# Patient Record
Sex: Male | Born: 1965 | Race: White | Hispanic: No | Marital: Single | Smoking: Current every day smoker
Health system: Southern US, Community
[De-identification: ages and names within clinical notes are randomized; demographics above are authoritative.]

## PROBLEM LIST (undated history)

## (undated) DIAGNOSIS — F419 Anxiety disorder, unspecified: Secondary | ICD-10-CM

## (undated) DIAGNOSIS — I10 Essential (primary) hypertension: Secondary | ICD-10-CM

## (undated) DIAGNOSIS — E785 Hyperlipidemia, unspecified: Secondary | ICD-10-CM

## (undated) DIAGNOSIS — F319 Bipolar disorder, unspecified: Secondary | ICD-10-CM

## (undated) DIAGNOSIS — K219 Gastro-esophageal reflux disease without esophagitis: Secondary | ICD-10-CM

## (undated) DIAGNOSIS — N200 Calculus of kidney: Secondary | ICD-10-CM

## (undated) DIAGNOSIS — I639 Cerebral infarction, unspecified: Secondary | ICD-10-CM

## (undated) HISTORY — DX: Cerebral infarction, unspecified: I63.9

---

## 2016-09-19 ENCOUNTER — Encounter (HOSPITAL_COMMUNITY): Payer: Self-pay | Admitting: Emergency Medicine

## 2016-09-19 ENCOUNTER — Emergency Department (HOSPITAL_COMMUNITY)
Admission: EM | Admit: 2016-09-19 | Discharge: 2016-09-19 | Disposition: A | Discharge status: Home / Self Care | Payer: BLUE CROSS/BLUE SHIELD | Attending: Emergency Medicine | Admitting: Emergency Medicine

## 2016-09-19 DIAGNOSIS — M10031 Idiopathic gout, right wrist: Secondary | ICD-10-CM | POA: Diagnosis not present

## 2016-09-19 DIAGNOSIS — M25531 Pain in right wrist: Secondary | ICD-10-CM | POA: Diagnosis present

## 2016-09-19 DIAGNOSIS — F17210 Nicotine dependence, cigarettes, uncomplicated: Secondary | ICD-10-CM | POA: Insufficient documentation

## 2016-09-19 MED ORDER — PREDNISONE 10 MG PO TABS: ORAL_TABLET | ORAL | 0 refills | Status: DC

## 2016-09-19 MED ORDER — DEXAMETHASONE SODIUM PHOSPHATE 10 MG/ML IJ SOLN
10.0000 mg | Freq: Once | INTRAMUSCULAR | Status: AC
  Administered 2016-09-19: 10 mg via INTRAMUSCULAR
  Filled 2016-09-19: qty 1

## 2016-09-19 MED ORDER — HYDROCODONE-ACETAMINOPHEN 5-325 MG PO TABS: 1.0000 | ORAL_TABLET | ORAL | 0 refills | Status: DC | PRN

## 2016-09-19 NOTE — Discharge Instructions (Signed)
Take your next dose of prednisone tomorrow ( you received todays dose of steroids in the shot you received). You may take the hydrocodone prescribed for pain relief.  This will make you drowsy - do not drive within 4 hours of taking this medication.  Follow up with your primary doctor for a recheck if not improving over the next 48 hours or for worsened symptoms.

## 2016-09-19 NOTE — ED Triage Notes (Signed)
Pain to right hand and wrist, rating pain 9/10.  Spasms to right elbow.

## 2016-09-20 NOTE — ED Provider Notes (Signed)
[facility] DEPT Provider Note   CSN: [id number] Arrival date & time: 09/19/16  1150     History   Chief Complaint Chief Complaint  Patient presents with  . Wrist Pain    HPI Cain SieveKenneth Salata is a 51 y.o. male with a history of gout, presenting with a gouty flare in his right wrist which started several days ago.  He reports frequent episodes of gout in feet, ankles and his right wrist and current episode is similar to his typical gouty flares.  He has applied warm compresses and has taken  Ibuprofen without relief of pain.  He is right handed. Reports in the process of moving to [place] , current [place] in [place].    The history is provided by the patient.    History reviewed. No pertinent past medical history.  There are no active problems to display for this patient.   History reviewed. No pertinent surgical history.     Home Medications    Prior to Admission medications   Medication Sig Start Date End Date Taking? Authorizing Provider  fluticasone (FLONASE) 50 MCG/ACT nasal spray USE 2 SPRAYS NASALLY EACH DAY 09/09/16  Yes [provider]  lamoTRIgine (LAMICTAL) 150 MG tablet Take 150 mg by mouth daily. 09/09/16  Yes [provider]  loratadine (CLARITIN) 10 MG tablet Take 10 mg by mouth daily. 09/09/16  Yes [provider]  lovastatin (MEVACOR) 20 MG tablet Take 40 mg by mouth daily. 09/09/16  Yes [provider]  omeprazole (PRILOSEC) 20 MG capsule Take 20 mg by mouth 2 (two) times daily. 09/09/16  Yes [provider]  HYDROcodone-acetaminophen (NORCO/VICODIN) 5-325 MG tablet Take 1 tablet by mouth every 4 (four) hours as needed. 09/19/16   Burgess AmorIdol, Tonnya Garbett, PA-C  predniSONE (DELTASONE) 10 MG tablet Take 6 tablets day one, 5 tablets day two, 4 tablets day three, 3 tablets day four, 2 tablets day five, then 1 tablet day six 09/19/16   Burgess AmorIdol, Shamari Trostel, PA-C    Family History History reviewed. No pertinent family history.  Social History Social  History  Substance Use Topics  . Smoking status: Current Every Day Smoker    Packs/day: 1.00    Types: Cigarettes  . Smokeless tobacco: Never Used  . Alcohol use No     Allergies   Wellbutrin [bupropion]   Review of Systems Review of Systems  Constitutional: Negative for fever.  Musculoskeletal: Positive for arthralgias and joint swelling. Negative for myalgias.  Skin: Positive for color change.  Neurological: Negative for weakness and numbness.     Physical Exam Updated Vital Signs BP (!) 156/89 (BP Location: Left Arm)   Pulse 78   Temp 98.1 F (36.7 C) (Oral)   Resp 16   Ht 5\' 6"  (1.676 m)   Wt 70.3 kg (155 lb)   SpO2 99%   BMI 25.02 kg/m   Physical Exam  Constitutional: He appears well-developed and well-nourished.  HENT:  Head: Atraumatic.  Neck: Normal range of motion.  Cardiovascular:  Pulses:      Radial pulses are 2+ on the right side, and 2+ on the left side.  Pulses equal bilaterally  Musculoskeletal: He exhibits edema and tenderness.  ttp right dorsal and volar wrist.  Mild erythema.  Pain with flex/ext and light touch.    Neurological: He is alert. He has normal strength. He displays normal reflexes. No sensory deficit.  Skin: Skin is warm and dry.  Erythematous at the right wrist without significant increased warmth.  Psychiatric: He has a normal mood and affect.     ED Treatments / Results  Labs (all labs ordered are listed, but only abnormal results are displayed) Labs Reviewed - No data to display  EKG  EKG Interpretation None       Radiology No results found.  Procedures Procedures (including critical care time)  Medications Ordered in ED Medications  dexamethasone (DECADRON) injection 10 mg (10 mg Intramuscular Given 09/19/16 1324)     Initial Impression / Assessment and Plan / ED Course  I have reviewed the triage vital signs and the nursing notes.  Pertinent labs & imaging results that were available during my care  of the patient were reviewed by me and considered in my medical decision making (see chart for details).     Exam and pt h/o c/w gouty flare.  No evidence or risk factors for joint infection. Decadron injection given here. Prednisone taper, hydrocodone, heat, elevation, rest.  F/u with pcp if not improving.   [place] [facility] prior to [facility].   Final Clinical Impressions(s) / ED Diagnoses   Final diagnoses:  Acute idiopathic gout of right wrist    New Prescriptions Discharge Medication List as of 09/19/2016  1:20 PM    START taking these medications   Details  HYDROcodone-acetaminophen (NORCO/VICODIN) 5-325 MG tablet Take 1 tablet by mouth every 4 (four) hours as needed., Starting Thu 09/19/2016, Print    predniSONE (DELTASONE) 10 MG tablet Take 6 tablets day one, 5 tablets day two, 4 tablets day three, 3 tablets day four, 2 tablets day five, then 1 tablet day six, Print         Burgess Amordol, Loran Auguste, PA-C 09/20/16 0836    Samuel JesterMcManus, Kathleen, DO 09/23/16 1404

## 2016-10-24 ENCOUNTER — Emergency Department
Admission: EM | Admit: 2016-10-24 | Discharge: 2016-10-24 | Disposition: A | Discharge status: Home / Self Care | Payer: BLUE CROSS/BLUE SHIELD | Attending: Emergency Medicine | Admitting: Emergency Medicine

## 2016-10-24 ENCOUNTER — Encounter: Payer: Self-pay | Admitting: Emergency Medicine

## 2016-10-24 DIAGNOSIS — Z79899 Other long term (current) drug therapy: Secondary | ICD-10-CM | POA: Insufficient documentation

## 2016-10-24 DIAGNOSIS — J019 Acute sinusitis, unspecified: Secondary | ICD-10-CM | POA: Insufficient documentation

## 2016-10-24 DIAGNOSIS — F17210 Nicotine dependence, cigarettes, uncomplicated: Secondary | ICD-10-CM | POA: Insufficient documentation

## 2016-10-24 DIAGNOSIS — J329 Chronic sinusitis, unspecified: Secondary | ICD-10-CM

## 2016-10-24 MED ORDER — AMOXICILLIN-POT CLAVULANATE 875-125 MG PO TABS: 1.0000 | ORAL_TABLET | Freq: Two times a day (BID) | ORAL | 0 refills | Status: AC

## 2016-10-24 MED ORDER — METHYLPREDNISOLONE SODIUM SUCC 125 MG IJ SOLR
125.0000 mg | Freq: Once | INTRAMUSCULAR | Status: AC
  Administered 2016-10-24: 125 mg via INTRAMUSCULAR
  Filled 2016-10-24: qty 2

## 2016-10-24 NOTE — ED Notes (Signed)
Pt states hx of sinus infections. "fatigue, sweats, um cold sometimes. My ear is stopped up. Headaches, I just have a yucky feeling." states symptoms x 1 week. Alert, oriented, ambulatory. "it's just my sinuses infected, that's all it is."

## 2016-10-24 NOTE — ED Triage Notes (Signed)
Patient presents to ED via POV from home with c/o sinus pressure and head ache. Hx of same.

## 2016-10-24 NOTE — ED Provider Notes (Signed)
[facility] Emergency Department Provider Note  ____________________________________________  Time seen: Approximately 12:33 PM  I have reviewed the triage vital signs and the nursing notes.   HISTORY  Chief Complaint Recurrent Sinusitis    HPI Timothy Pham is a 51 y.o. male that presents to the emergency department with 7 days of nasal congestion and drainage. A couple of days ago he started coughing clear sputum. He has about 2-3 episodes of sinusitis per year. He states that he usually gets a shot and a prescription for amoxicillin, which resolved his symptoms. He smokes a pack of cigarettes per day.He denies fever, chills, shortness breath, chest pain, nausea, vomiting, abdominal pain.   History reviewed. No pertinent past medical history.  There are no active problems to display for this patient.   History reviewed. No pertinent surgical history.  Prior to Admission medications   Medication Sig Start Date End Date Taking? Authorizing Provider  amoxicillin-clavulanate (AUGMENTIN) 875-125 MG tablet Take 1 tablet by mouth 2 (two) times daily. 10/24/16 11/03/16  Enid Derry, PA-C  fluticasone (FLONASE) 50 MCG/ACT nasal spray USE 2 SPRAYS NASALLY EACH DAY 09/09/16   [provider]  HYDROcodone-acetaminophen (NORCO/VICODIN) 5-325 MG tablet Take 1 tablet by mouth every 4 (four) hours as needed. 09/19/16   Burgess Amor, PA-C  lamoTRIgine (LAMICTAL) 150 MG tablet Take 150 mg by mouth daily. 09/09/16   [provider]  loratadine (CLARITIN) 10 MG tablet Take 10 mg by mouth daily. 09/09/16   [provider]  lovastatin (MEVACOR) 20 MG tablet Take 40 mg by mouth daily. 09/09/16   [provider]  omeprazole (PRILOSEC) 20 MG capsule Take 20 mg by mouth 2 (two) times daily. 09/09/16   [provider]  predniSONE (DELTASONE) 10 MG tablet Take 6 tablets day one, 5 tablets day two, 4 tablets day three, 3 tablets day four, 2 tablets  day five, then 1 tablet day six 09/19/16   Burgess Amor, PA-C    Allergies Wellbutrin [bupropion]  No family history on file.  Social History Social History  Substance Use Topics  . Smoking status: Current Every Day Smoker    Packs/day: 1.00    Types: Cigarettes  . Smokeless tobacco: Never Used  . Alcohol use No     Review of Systems  Constitutional: No fever/chills Eyes: No visual changes. No discharge. ENT: Positive for congestion and rhinorrhea. Cardiovascular: No chest pain. Respiratory: Positive for cough. No SOB. Gastrointestinal: No abdominal pain.  No nausea, no vomiting.  No diarrhea.  No constipation. Musculoskeletal: Negative for musculoskeletal pain. Skin: Negative for rash, abrasions, lacerations, ecchymosis. Neurological: Negative for headaches.   ____________________________________________   PHYSICAL EXAM:  VITAL SIGNS: ED Triage Vitals  Enc Vitals Group     BP 10/24/16 1140 (!) 145/74     Pulse Rate 10/24/16 1140 68     Resp 10/24/16 1140 17     Temp 10/24/16 1140 98 F (36.7 C)     Temp src --      SpO2 10/24/16 1140 98 %     Weight 10/24/16 1141 155 lb (70.3 kg)     Height 10/24/16 1141  (1.702 m)     Head Circumference --      Peak Flow --      Pain Score 10/24/16 1140 8     Pain Loc --      Pain Edu? --      Excl. in GC? --      Constitutional: Alert  and oriented. Well appearing and in no acute distress. Eyes: Conjunctivae are normal. PERRL. EOMI. No discharge. Head: Atraumatic. ENT: Frontal and maxillary sinus tenderness.      Ears: Tympanic membranes pearly gray with good landmarks. No discharge.      Nose: Mild congestion/rhinnorhea.      Mouth/Throat: Mucous membranes are moist. Oropharynx non-erythematous. Tonsils not enlarged. No exudates. Uvula midline. Neck: No stridor.   Hematological/Lymphatic/Immunilogical: No cervical lymphadenopathy. Cardiovascular: Normal rate, regular rhythm.  Good peripheral  circulation. Respiratory: Normal respiratory effort without tachypnea or retractions. Lungs CTAB. Good air entry to the bases with no decreased or absent breath sounds. Gastrointestinal: Bowel sounds 4 quadrants. Soft and nontender to palpation. No guarding or rigidity. No palpable masses. No distention. Musculoskeletal: Full range of motion to all extremities. No gross deformities appreciated. Neurologic:  Normal speech and language. No gross focal neurologic deficits are appreciated.  Skin:  Skin is warm, dry and intact. No rash noted.   ____________________________________________   LABS (all labs ordered are listed, but only abnormal results are displayed)  Labs Reviewed - No data to display ____________________________________________  EKG   ____________________________________________  RADIOLOGY   No results found.  ____________________________________________    PROCEDURES  Procedure(s) performed:    Procedures    Medications  methylPREDNISolone sodium succinate (SOLU-MEDROL) 125 mg/2 mL injection 125 mg (125 mg Intramuscular Given 10/24/16 1246)     ____________________________________________   INITIAL IMPRESSION / ASSESSMENT AND PLAN / ED COURSE  Pertinent labs & imaging results that were available during my care of the patient were reviewed by me and considered in my medical decision making (see chart for details).  Review of the [place] CSRS was performed in accordance of the [facility] prior to dispensing any controlled drugs.  Patient's diagnosis is consistent with sinus infection. Vital signs and exam are reassuring. He was given a shot of Solu-Medrol and will be given a prescription for Augmentin. Patient feels comfortable going home. Patient is to follow up with Augmentin as needed or otherwise directed. Patient is given ED precautions to return to the ED for any worsening or new symptoms.     ____________________________________________  FINAL  CLINICAL IMPRESSION(S) / ED DIAGNOSES  Final diagnoses:  Sinusitis, unspecified chronicity, unspecified location      NEW MEDICATIONS STARTED DURING THIS VISIT:  Discharge Medication List as of 10/24/2016 12:45 PM    START taking these medications   Details  amoxicillin-clavulanate (AUGMENTIN) 875-125 MG tablet Take 1 tablet by mouth 2 (two) times daily., Starting Thu 10/24/2016, Until Sun 11/03/2016, Print            This chart was dictated using voice recognition software/Dragon. Despite best efforts to proofread, errors can occur which can change the meaning. Any change was purely unintentional.    Enid DerryWagner, Kadeshia Kasparian, PA-C 10/24/16 1543    Emily FilbertWilliams, Jonathan E, MD 10/25/16 [phone]

## 2017-06-28 ENCOUNTER — Emergency Department
Admission: EM | Admit: 2017-06-28 | Discharge: 2017-06-28 | Disposition: A | Discharge status: Home / Self Care | Payer: BLUE CROSS/BLUE SHIELD | Attending: Emergency Medicine | Admitting: Emergency Medicine

## 2017-06-28 ENCOUNTER — Emergency Department: Payer: BLUE CROSS/BLUE SHIELD

## 2017-06-28 ENCOUNTER — Other Ambulatory Visit: Payer: Self-pay

## 2017-06-28 DIAGNOSIS — S2241XA Multiple fractures of ribs, right side, initial encounter for closed fracture: Secondary | ICD-10-CM | POA: Insufficient documentation

## 2017-06-28 DIAGNOSIS — Y929 Unspecified place or not applicable: Secondary | ICD-10-CM | POA: Insufficient documentation

## 2017-06-28 DIAGNOSIS — Y998 Other external cause status: Secondary | ICD-10-CM | POA: Insufficient documentation

## 2017-06-28 DIAGNOSIS — Z79899 Other long term (current) drug therapy: Secondary | ICD-10-CM | POA: Insufficient documentation

## 2017-06-28 DIAGNOSIS — F17210 Nicotine dependence, cigarettes, uncomplicated: Secondary | ICD-10-CM | POA: Diagnosis not present

## 2017-06-28 DIAGNOSIS — W010XXA Fall on same level from slipping, tripping and stumbling without subsequent striking against object, initial encounter: Secondary | ICD-10-CM | POA: Diagnosis not present

## 2017-06-28 DIAGNOSIS — Y9389 Activity, other specified: Secondary | ICD-10-CM | POA: Insufficient documentation

## 2017-06-28 DIAGNOSIS — S299XXA Unspecified injury of thorax, initial encounter: Secondary | ICD-10-CM | POA: Diagnosis present

## 2017-06-28 MED ORDER — HYDROCODONE-ACETAMINOPHEN 5-325 MG PO TABS: 1.0000 | ORAL_TABLET | Freq: Four times a day (QID) | ORAL | 0 refills | Status: AC | PRN

## 2017-06-28 MED ORDER — MELOXICAM 15 MG PO TABS: 15.0000 mg | ORAL_TABLET | Freq: Every day | ORAL | 1 refills | Status: AC

## 2017-06-28 NOTE — ED Provider Notes (Signed)
[facility] Emergency Department Provider Note  ____________________________________________  Time seen: Approximately 6:17 PM  I have reviewed the triage vital signs and the nursing notes.   HISTORY  Chief Complaint Fall    HPI Timothy Pham is a 52 y.o. male presents to the emergency department after a fall while fishing 2 days ago.  Patient reports that he heard a crack".  Patient reports 10 out of 10 pain to the right anterior ribs.  Patient reports that he is presenting to the emergency department for pain management "just at night".  He denies shortness of breath or chest pain.  Patient did not hit his head or lose consciousness during the fall.   No past medical history on file.  There are no active problems to display for this patient.   No past surgical history on file.  Prior to Admission medications   Medication Sig Start Date End Date Taking? Authorizing Provider  fluticasone (FLONASE) 50 MCG/ACT nasal spray USE 2 SPRAYS NASALLY EACH DAY 09/09/16   [provider]  HYDROcodone-acetaminophen (NORCO) 5-325 MG tablet Take 1 tablet by mouth every 6 (six) hours as needed for up to 3 days for moderate pain. 06/28/17 07/01/17  Orvil Feil, PA-C  lamoTRIgine (LAMICTAL) 150 MG tablet Take 150 mg by mouth daily. 09/09/16   [provider]  loratadine (CLARITIN) 10 MG tablet Take 10 mg by mouth daily. 09/09/16   [provider]  lovastatin (MEVACOR) 20 MG tablet Take 40 mg by mouth daily. 09/09/16   [provider]  meloxicam (MOBIC) 15 MG tablet Take 1 tablet (15 mg total) by mouth daily for 7 days. 06/28/17 07/05/17  Orvil Feil, PA-C  omeprazole (PRILOSEC) 20 MG capsule Take 20 mg by mouth 2 (two) times daily. 09/09/16   [provider]  predniSONE (DELTASONE) 10 MG tablet Take 6 tablets day one, 5 tablets day two, 4 tablets day three, 3 tablets day four, 2 tablets day five, then 1 tablet day six 09/19/16   Burgess Amor, PA-C    Allergies Wellbutrin [bupropion]  No family history on file.  Social History Social History   Tobacco Use  . Smoking status: Current Every Day Smoker    Packs/day: 1.00    Types: Cigarettes  . Smokeless tobacco: Never Used  Substance Use Topics  . Alcohol use: No  . Drug use: No     Review of Systems  Constitutional: No fever/chills Eyes: No visual changes. No discharge ENT: No upper respiratory complaints. Cardiovascular: no chest pain. Respiratory: no cough. No SOB. Gastrointestinal: No abdominal pain.  No nausea, no vomiting.  No diarrhea.  No constipation. Musculoskeletal: Patient has right anterior rib pain.  Skin: Negative for rash, abrasions, lacerations, ecchymosis. Neurological: Negative for headaches, focal weakness or numbness.   ____________________________________________   PHYSICAL EXAM:  VITAL SIGNS: ED Triage Vitals  Enc Vitals Group     BP 06/28/17 1640 (!) 149/70     Pulse Rate 06/28/17 1640 67     Resp 06/28/17 1640 16     Temp 06/28/17 1640 97.6 F (36.4 C)     Temp Source 06/28/17 1640 Oral     SpO2 06/28/17 1640 97 %     Weight 06/28/17 1639 165 lb (74.8 kg)     Height 06/28/17 1639  (1.651 m)     Head Circumference --      Peak Flow --      Pain Score 06/28/17 1639 5  Pain Loc --      Pain Edu? --      Excl. in GC? --      Constitutional: Alert and oriented. Well appearing and in no acute distress. Eyes: Conjunctivae are normal. PERRL. EOMI. Head: Atraumatic. ENT:      Ears: TMs are pearly.      Nose: No congestion/rhinnorhea.      Mouth/Throat: Mucous membranes are moist.  Neck: No stridor.  No cervical spine tenderness to palpation. Cardiovascular: Normal rate, regular rhythm. Normal S1 and S2.  Good peripheral circulation. Respiratory: Normal respiratory effort without tachypnea or retractions. Lungs CTAB. Good air entry to the bases with no decreased or absent breath sounds. Musculoskeletal: Full  range of motion to all extremities. No gross deformities appreciated.  Patient has tenderness to palpation over right anterior ribs. Neurologic:  Normal speech and language. No gross focal neurologic deficits are appreciated.  Skin:  Skin is warm, dry and intact. No rash noted. ____________________________________________   LABS (all labs ordered are listed, but only abnormal results are displayed)  Labs Reviewed - No data to display ____________________________________________  EKG   ____________________________________________  RADIOLOGY Geraldo Pitter, personally viewed and evaluated these images (plain radiographs) as part of my medical decision making, as well as reviewing the written report by the radiologist.  Dg Ribs Unilateral W/chest Right  Result Date: 06/28/2017 CLINICAL DATA:  Recent fall 2 days ago with right-sided rib pain, initial encounter EXAM: RIGHT RIBS AND CHEST - 3+ VIEW COMPARISON:  None. FINDINGS: Cardiac shadow is within normal limits. The lungs are well aerated bilaterally. There are mildly displaced fractures of the right seventh and eighth ribs anteriorly. Some old rib fractures on the right are noted as well. No pneumothorax is seen. Chronic appearing T12 compression deformity is noted. IMPRESSION: Acute rib fractures on the right of the seventh and eighth ribs without complicating factors. Chronic appearing T12 compression deformity. Electronically Signed   By: Alcide Clever M.D.   On: 06/28/2017 17:35    ____________________________________________    PROCEDURES  Procedure(s) performed:    Procedures    Medications - No data to display   ____________________________________________   INITIAL IMPRESSION / ASSESSMENT AND PLAN / ED COURSE  Pertinent labs & imaging results that were available during my care of the patient were reviewed by me and considered in my medical decision making (see chart for details).  Review of the [place] CSRS was  performed in accordance of the [facility] prior to dispensing any controlled drugs.    Assessment and plan Rib fractures Patient presents to the emergency department with right anterior rib pain after a fall.  X-ray examination of the right ribs reveal nondisplaced seventh and eighth rib fractures.  Patient was given a short course of Norco and meloxicam.  He was advised to follow-up with primary care as needed.  All patient questions were answered.   ____________________________________________  FINAL CLINICAL IMPRESSION(S) / ED DIAGNOSES  Final diagnoses:  Closed fracture of multiple ribs of right side, initial encounter      NEW MEDICATIONS STARTED DURING THIS VISIT:  ED Discharge Orders        Ordered    HYDROcodone-acetaminophen (NORCO) 5-325 MG tablet  Every 6 hours PRN     06/28/17 1756    meloxicam (MOBIC) 15 MG tablet  Daily     06/28/17 1756          This chart was dictated using voice recognition software/Dragon. Despite best efforts to  proofread, errors can occur which can change the meaning. Any change was purely unintentional.    Orvil Feil, PA-C 06/28/17 1821    Phineas Semen, MD 06/28/17 2009

## 2017-06-28 NOTE — ED Triage Notes (Signed)
C/O right rib injury. States slipped while fishing a few days ago and landed on tackle box.  STates he heard ribs crack.  AAOx3.  Skin warm and dry.  Respirations regular and non labored.  No SOB/ DOE.  Gait steady. Posture upright and relaxed.  NAD

## 2018-01-19 ENCOUNTER — Other Ambulatory Visit: Payer: Self-pay

## 2018-01-19 ENCOUNTER — Encounter: Payer: Self-pay | Admitting: Emergency Medicine

## 2018-01-19 ENCOUNTER — Inpatient Hospital Stay
Admission: EM | Admit: 2018-01-19 | Discharge: 2018-01-22 | DRG: 024 | Disposition: A | Discharge status: Home / Self Care | Payer: BLUE CROSS/BLUE SHIELD | Attending: Internal Medicine | Admitting: Internal Medicine

## 2018-01-19 ENCOUNTER — Emergency Department: Payer: BLUE CROSS/BLUE SHIELD

## 2018-01-19 DIAGNOSIS — E785 Hyperlipidemia, unspecified: Secondary | ICD-10-CM | POA: Diagnosis present

## 2018-01-19 DIAGNOSIS — Z888 Allergy status to other drugs, medicaments and biological substances status: Secondary | ICD-10-CM

## 2018-01-19 DIAGNOSIS — I63231 Cerebral infarction due to unspecified occlusion or stenosis of right carotid arteries: Principal | ICD-10-CM | POA: Diagnosis present

## 2018-01-19 DIAGNOSIS — F17210 Nicotine dependence, cigarettes, uncomplicated: Secondary | ICD-10-CM | POA: Diagnosis present

## 2018-01-19 DIAGNOSIS — G459 Transient cerebral ischemic attack, unspecified: Secondary | ICD-10-CM | POA: Diagnosis not present

## 2018-01-19 DIAGNOSIS — Z818 Family history of other mental and behavioral disorders: Secondary | ICD-10-CM

## 2018-01-19 DIAGNOSIS — K219 Gastro-esophageal reflux disease without esophagitis: Secondary | ICD-10-CM | POA: Diagnosis present

## 2018-01-19 DIAGNOSIS — Z79899 Other long term (current) drug therapy: Secondary | ICD-10-CM

## 2018-01-19 DIAGNOSIS — Z9114 Patient's other noncompliance with medication regimen: Secondary | ICD-10-CM

## 2018-01-19 DIAGNOSIS — I10 Essential (primary) hypertension: Secondary | ICD-10-CM | POA: Diagnosis present

## 2018-01-19 DIAGNOSIS — F419 Anxiety disorder, unspecified: Secondary | ICD-10-CM | POA: Diagnosis present

## 2018-01-19 DIAGNOSIS — F329 Major depressive disorder, single episode, unspecified: Secondary | ICD-10-CM | POA: Diagnosis present

## 2018-01-19 DIAGNOSIS — R51 Headache: Secondary | ICD-10-CM

## 2018-01-19 DIAGNOSIS — Z66 Do not resuscitate: Secondary | ICD-10-CM | POA: Diagnosis present

## 2018-01-19 DIAGNOSIS — G40909 Epilepsy, unspecified, not intractable, without status epilepticus: Secondary | ICD-10-CM | POA: Diagnosis present

## 2018-01-19 DIAGNOSIS — R519 Headache, unspecified: Secondary | ICD-10-CM | POA: Diagnosis present

## 2018-01-19 DIAGNOSIS — Z87442 Personal history of urinary calculi: Secondary | ICD-10-CM

## 2018-01-19 DIAGNOSIS — R29700 NIHSS score 0: Secondary | ICD-10-CM | POA: Diagnosis present

## 2018-01-19 DIAGNOSIS — T465X6A Underdosing of other antihypertensive drugs, initial encounter: Secondary | ICD-10-CM | POA: Diagnosis present

## 2018-01-19 DIAGNOSIS — Z8249 Family history of ischemic heart disease and other diseases of the circulatory system: Secondary | ICD-10-CM

## 2018-01-19 HISTORY — DX: Gastro-esophageal reflux disease without esophagitis: K21.9

## 2018-01-19 HISTORY — DX: Bipolar disorder, unspecified: F31.9

## 2018-01-19 HISTORY — DX: Anxiety disorder, unspecified: F41.9

## 2018-01-19 HISTORY — DX: Calculus of kidney: N20.0

## 2018-01-19 HISTORY — DX: Essential (primary) hypertension: I10

## 2018-01-19 HISTORY — DX: Hyperlipidemia, unspecified: E78.5

## 2018-01-19 LAB — CBC
HEMATOCRIT: 45.2 % (ref 39.0–52.0)
Hemoglobin: 15.3 g/dL (ref 13.0–17.0)
MCH: 31.7 pg (ref 26.0–34.0)
MCHC: 33.8 g/dL (ref 30.0–36.0)
MCV: 93.6 fL (ref 80.0–100.0)
PLATELETS: 299 10*3/uL (ref 150–400)
RBC: 4.83 MIL/uL (ref 4.22–5.81)
RDW: 13.3 % (ref 11.5–15.5)
WBC: 12.8 10*3/uL — ABNORMAL HIGH (ref 4.0–10.5)
nRBC: 0 % (ref 0.0–0.2)

## 2018-01-19 MED ORDER — NITROGLYCERIN 2 % TD OINT
TOPICAL_OINTMENT | TRANSDERMAL | Status: AC
  Administered 2018-01-19: 1 [in_us]
  Filled 2018-01-19: qty 1

## 2018-01-19 NOTE — ED Triage Notes (Signed)
Pt arrived to the ED via EMS from work for complaints of high blood pressure, headache and trouble concentrating. Pt reports that he began to experience the symptoms 2 days ago. Pt states that he has been diagnosed with hypertension but never took the medication. Pt is AOx4 in no apparent distress.

## 2018-01-20 ENCOUNTER — Encounter: Payer: Self-pay | Admitting: Radiology

## 2018-01-20 ENCOUNTER — Observation Stay: Payer: BLUE CROSS/BLUE SHIELD

## 2018-01-20 ENCOUNTER — Other Ambulatory Visit
Admission: RE | Admit: 2018-01-20 | Discharge: 2018-01-20 | Disposition: A | Discharge status: Home / Self Care | Payer: BLUE CROSS/BLUE SHIELD | Source: Home / Self Care | Attending: Family Medicine | Admitting: Family Medicine

## 2018-01-20 ENCOUNTER — Other Ambulatory Visit: Payer: Self-pay

## 2018-01-20 ENCOUNTER — Emergency Department: Payer: BLUE CROSS/BLUE SHIELD

## 2018-01-20 DIAGNOSIS — F17210 Nicotine dependence, cigarettes, uncomplicated: Secondary | ICD-10-CM

## 2018-01-20 DIAGNOSIS — R519 Headache, unspecified: Secondary | ICD-10-CM | POA: Diagnosis present

## 2018-01-20 DIAGNOSIS — E785 Hyperlipidemia, unspecified: Secondary | ICD-10-CM | POA: Diagnosis not present

## 2018-01-20 DIAGNOSIS — R51 Headache: Secondary | ICD-10-CM

## 2018-01-20 DIAGNOSIS — I63231 Cerebral infarction due to unspecified occlusion or stenosis of right carotid arteries: Secondary | ICD-10-CM | POA: Diagnosis not present

## 2018-01-20 DIAGNOSIS — G459 Transient cerebral ischemic attack, unspecified: Secondary | ICD-10-CM

## 2018-01-20 LAB — LIPID PANEL
Cholesterol: 266 mg/dL — ABNORMAL HIGH (ref 0–200)
HDL: 53 mg/dL (ref >40)
LDL Cholesterol: 169 mg/dL — ABNORMAL HIGH (ref 0–99)
Total CHOL/HDL Ratio: 5 RATIO
Triglycerides: 222 mg/dL — ABNORMAL HIGH (ref <150)
VLDL: 44 mg/dL — ABNORMAL HIGH (ref 0–40)

## 2018-01-20 LAB — URINE DRUG SCREEN, QUALITATIVE (ARMC ONLY)
AMPHETAMINES, UR SCREEN: NOT DETECTED
Barbiturates, Ur Screen: NOT DETECTED
Benzodiazepine, Ur Scrn: NOT DETECTED
COCAINE METABOLITE, UR ~~LOC~~: NOT DETECTED
Cannabinoid 50 Ng, Ur ~~LOC~~: POSITIVE — AB
MDMA (ECSTASY) UR SCREEN: NOT DETECTED
METHADONE SCREEN, URINE: NOT DETECTED
Opiate, Ur Screen: NOT DETECTED
Phencyclidine (PCP) Ur S: NOT DETECTED
TRICYCLIC, UR SCREEN: NOT DETECTED

## 2018-01-20 LAB — BASIC METABOLIC PANEL
ANION GAP: 13 (ref 5–15)
BUN: 15 mg/dL (ref 6–20)
CHLORIDE: 106 mmol/L (ref 98–111)
CO2: 24 mmol/L (ref 22–32)
Calcium: 9.7 mg/dL (ref 8.9–10.3)
Creatinine, Ser: 0.88 mg/dL (ref 0.61–1.24)
GFR calc Af Amer: >60 mL/min (ref >60)
GFR calc non Af Amer: >60 mL/min (ref >60)
GLUCOSE: 107 mg/dL — AB (ref 70–99)
POTASSIUM: 3.9 mmol/L (ref 3.5–5.1)
Sodium: 143 mmol/L (ref 135–145)

## 2018-01-20 LAB — TROPONIN I: Troponin I: <0.03 ng/mL (ref <0.03)

## 2018-01-20 LAB — HEMOGLOBIN A1C
Hgb A1c MFr Bld: 5.9 % — ABNORMAL HIGH (ref 4.8–5.6)
Mean Plasma Glucose: 122.63 mg/dL

## 2018-01-20 LAB — ETHANOL: Alcohol, Ethyl (B): <10 mg/dL (ref <10)

## 2018-01-20 LAB — TSH: TSH: 2.816 u[IU]/mL (ref 0.350–4.500)

## 2018-01-20 MED ORDER — LISINOPRIL 5 MG PO TABS: 10.0000 mg | ORAL_TABLET | Freq: Every day | ORAL | Status: DC

## 2018-01-20 MED ORDER — ACETAMINOPHEN 325 MG PO TABS: 650.0000 mg | ORAL_TABLET | Freq: Four times a day (QID) | ORAL | Status: DC | PRN

## 2018-01-20 MED ORDER — LISINOPRIL 5 MG PO TABS: 5.0000 mg | ORAL_TABLET | Freq: Every day | ORAL | Status: DC

## 2018-01-20 MED ORDER — GADOBUTROL 1 MMOL/ML IV SOLN
7.5000 mL | Freq: Once | INTRAVENOUS | Status: AC | PRN
  Administered 2018-01-20: 7.5 mL via INTRAVENOUS

## 2018-01-20 MED ORDER — IOHEXOL 350 MG/ML SOLN
75.0000 mL | Freq: Once | INTRAVENOUS | Status: AC | PRN
  Administered 2018-01-20: 75 mL via INTRAVENOUS

## 2018-01-20 MED ORDER — ONDANSETRON HCL 4 MG/2ML IJ SOLN: 4.0000 mg | Freq: Four times a day (QID) | INTRAMUSCULAR | Status: DC | PRN

## 2018-01-20 MED ORDER — ASPIRIN 300 MG RE SUPP: 300.0000 mg | Freq: Every day | RECTAL | Status: DC

## 2018-01-20 MED ORDER — NICOTINE 21 MG/24HR TD PT24
21.0000 mg | MEDICATED_PATCH | Freq: Once | TRANSDERMAL | Status: AC
  Administered 2018-01-20: 21 mg via TRANSDERMAL
  Filled 2018-01-20: qty 1

## 2018-01-20 MED ORDER — LAMOTRIGINE 25 MG PO TABS: 150.0000 mg | ORAL_TABLET | Freq: Every day | ORAL | Status: DC

## 2018-01-20 MED ORDER — FLUTICASONE PROPIONATE 50 MCG/ACT NA SUSP: 2.0000 | Freq: Every day | NASAL | Status: DC

## 2018-01-20 MED ORDER — PRAVASTATIN SODIUM 20 MG PO TABS: 20.0000 mg | ORAL_TABLET | Freq: Every day | ORAL | Status: DC

## 2018-01-20 MED ORDER — METOPROLOL TARTRATE 25 MG PO TABS: 25.0000 mg | ORAL_TABLET | Freq: Two times a day (BID) | ORAL | Status: DC

## 2018-01-20 MED ORDER — HYDRALAZINE HCL 20 MG/ML IJ SOLN: 10.0000 mg | Freq: Four times a day (QID) | INTRAMUSCULAR | Status: DC | PRN

## 2018-01-20 MED ORDER — ENOXAPARIN SODIUM 40 MG/0.4ML ~~LOC~~ SOLN
40.0000 mg | SUBCUTANEOUS | Status: DC
  Administered 2018-01-20 – 2018-01-22 (×3): 40 mg via SUBCUTANEOUS
  Filled 2018-01-20 (×3): qty 0.4

## 2018-01-20 MED ORDER — ARIPIPRAZOLE 10 MG PO TABS
10.0000 mg | ORAL_TABLET | Freq: Every day | ORAL | Status: DC
  Filled 2018-01-20 (×2): qty 1

## 2018-01-20 MED ORDER — PANTOPRAZOLE SODIUM 40 MG PO TBEC
40.0000 mg | DELAYED_RELEASE_TABLET | Freq: Every day | ORAL | Status: DC
  Administered 2018-01-20 – 2018-01-22 (×3): 40 mg via ORAL
  Filled 2018-01-20 (×3): qty 1

## 2018-01-20 MED ORDER — AMLODIPINE BESYLATE 5 MG PO TABS
5.0000 mg | ORAL_TABLET | Freq: Every day | ORAL | Status: DC
  Administered 2018-01-20: 5 mg via ORAL
  Filled 2018-01-20: qty 1

## 2018-01-20 MED ORDER — ATORVASTATIN CALCIUM 20 MG PO TABS
40.0000 mg | ORAL_TABLET | Freq: Every day | ORAL | Status: DC
  Administered 2018-01-20 – 2018-01-21 (×2): 40 mg via ORAL
  Filled 2018-01-20 (×2): qty 2

## 2018-01-20 MED ORDER — ACETAMINOPHEN 650 MG RE SUPP: 650.0000 mg | Freq: Four times a day (QID) | RECTAL | Status: DC | PRN

## 2018-01-20 MED ORDER — ASPIRIN 325 MG PO TABS
325.0000 mg | ORAL_TABLET | Freq: Every day | ORAL | Status: DC
  Administered 2018-01-20 – 2018-01-22 (×3): 325 mg via ORAL
  Filled 2018-01-20 (×6): qty 1

## 2018-01-20 MED ORDER — STROKE: EARLY STAGES OF RECOVERY BOOK: Freq: Once | Status: DC

## 2018-01-20 MED ORDER — LORATADINE 10 MG PO TABS
10.0000 mg | ORAL_TABLET | Freq: Every day | ORAL | Status: DC
  Administered 2018-01-20 – 2018-01-22 (×3): 10 mg via ORAL
  Filled 2018-01-20 (×3): qty 1

## 2018-01-20 MED ORDER — CEFAZOLIN SODIUM-DEXTROSE 2-4 GM/100ML-% IV SOLN
2.0000 g | Freq: Once | INTRAVENOUS | Status: AC
  Administered 2018-01-21 (×2): 2 g via INTRAVENOUS
  Filled 2018-01-20: qty 100

## 2018-01-20 MED ORDER — LAMOTRIGINE 25 MG PO TABS
150.0000 mg | ORAL_TABLET | Freq: Every day | ORAL | Status: DC
  Administered 2018-01-20: 150 mg via ORAL
  Filled 2018-01-20: qty 1

## 2018-01-20 MED ORDER — SODIUM CHLORIDE 0.9 % IV SOLN
INTRAVENOUS | Status: DC
  Administered 2018-01-21: 01:00:00 via INTRAVENOUS

## 2018-01-20 MED ORDER — ONDANSETRON HCL 4 MG PO TABS: 4.0000 mg | ORAL_TABLET | Freq: Four times a day (QID) | ORAL | Status: DC | PRN

## 2018-01-20 MED ORDER — DOCUSATE SODIUM 100 MG PO CAPS
100.0000 mg | ORAL_CAPSULE | Freq: Two times a day (BID) | ORAL | Status: DC
  Administered 2018-01-20 (×2): 100 mg via ORAL
  Filled 2018-01-20 (×3): qty 1

## 2018-01-20 NOTE — Evaluation (Signed)
Physical Therapy Evaluation Patient Details Name: Timothy SieveKenneth Dacquisto MRN: [id number] DOB: 07/16/1965 Today's Date: 01/20/2018   History of Present Illness  52 y/o male here with a few days of confusion, coordination issues and generally feeling off.  EMS found his BP to be 200s/100s.  Pt brought in for observation.   Clinical Impression  Pt was able to ambulate around the nurses station with slight L limp but overall was independent with all tasks despite feeling off and having some confusion and processing issues.  Pt was aware of the date and had reasonable insight, but showed some impulsivity/decision making issues and overall reports that he has not been himself at all over the last few days.  Pt unable to process some coordination testing (most notedly having dysdiadochokinesia).  Pt eager to get out of hospital as soon as possible, but does not seem to fully understand his safety/awareness issues.   Follow Up Recommendations Outpatient PT    Equipment Recommendations  None recommended by PT    Recommendations for Other Services       Precautions / Restrictions Precautions Precautions: None Restrictions Weight Bearing Restrictions: No      Mobility  Bed Mobility Overal bed mobility: Modified Independent             General bed mobility comments: Pt able to power up to EOB, showed some issues with initial balance in sitting but able to sort himself enough maintain balance sitting EOB w/o assist  Transfers Overall transfer level: Modified independent Equipment used: None             General transfer comment: Pt is able to rise to standing w/o direct assist, he had some impulsivity with transfer but is able to maintain balance w/o assist  Ambulation/Gait Ambulation/Gait assistance: Modified independent (Device/Increase time) Gait Distance (Feet): 250 Feet         General Gait Details: Pt with consistent limp on the L, siting gout flare up.  He did not have any overt  LOBs or safety issues, but overall is not at his baseline.  Stairs Stairs: Yes Stairs assistance: Supervision Stair Management: One rail Right Number of Stairs: 6 General stair comments: Pt able to negotiate up/down steps with single rail use and step-to pattern.  Wheelchair Mobility    Modified Rankin (Stroke Patients Only)       Balance Overall balance assessment: Modified Independent(Pt with no overt LOBs, but showed limp and hesitancy on L)                                           Pertinent Vitals/Pain Pain Assessment: No/denies pain(however with WBing c/o gout pain in L foot/ankle)    Home Living Family/patient expects to be discharged to:: Private residence Living Arrangements: Alone     Home Access: Stairs to enter Entrance Stairs-Rails: Doctor, general practiceight;Left Entrance Stairs-Number of Steps: 5   Home Equipment: None      Prior Function Level of Independence: Independent         Comments: Pt works, drives, runs errands, etc w/o issue at baseline     Hand Dominance        Extremity/Trunk Assessment   Upper Extremity Assessment Upper Extremity Assessment: Overall WFL for tasks assessed    Lower Extremity Assessment Lower Extremity Assessment: Overall WFL for tasks assessed(some minimal L sided weakness and coordination issues)  Communication   Communication: No difficulties  Cognition Arousal/Alertness: Awake/alert Behavior During Therapy: Impulsive;Restless Overall Cognitive Status: Impaired/Different from baseline Area of Impairment: Awareness;Safety/judgement                               General Comments: Pt with some general confusion and disorganized thoughts during session.  Reports last few days he has forgotten directions while driving, leaving stove on, confusion with minimally complex/daily tasks (job, tasks in the home, etc)      General Comments      Exercises     Assessment/Plan    PT Assessment  Patient needs continued PT services  PT Problem List         PT Treatment Interventions Gait training;Stair training;Functional mobility training;Therapeutic activities;Therapeutic exercise;Balance training;Neuromuscular re-education;Patient/family education    PT Goals (Current goals can be found in the Care Plan section)  Acute Rehab PT Goals Patient Stated Goal: get back to work today PT Goal Formulation: With patient Time For Goal Achievement: 02/03/18 Potential to Achieve Goals: Good    Frequency Min 2X/week   Barriers to discharge        Co-evaluation               AM-PAC PT "6 Clicks" Mobility  Outcome Measure Help needed turning from your back to your side while in a flat bed without using bedrails?: None Help needed moving from lying on your back to sitting on the side of a flat bed without using bedrails?: None Help needed moving to and from a bed to a chair (including a wheelchair)?: None Help needed standing up from a chair using your arms (e.g., wheelchair or bedside chair)?: None Help needed to walk in hospital room?: None Help needed climbing 3-5 steps with a railing? : None 6 Click Score: 24    End of Session Equipment Utilized During Treatment: Gait belt Activity Tolerance: Patient tolerated treatment well Patient left: in chair;with call bell/phone within reach Nurse Communication: Mobility status PT Visit Diagnosis: Difficulty in walking, not elsewhere classified (R26.2)    Time: 1610-9604 PT Time Calculation (min) (ACUTE ONLY): 25 min   Charges:   PT Evaluation $PT Eval Low Complexity: 1 Low          Malachi Pro, DPT 01/20/2018, 9:51 AM

## 2018-01-20 NOTE — ED Notes (Signed)
Worker's comp:  Pt employer requests urine and breath analysis.  Pt arrives with employer provided forms.  Consents obtained. Urine specimen obtained and released to lab.  Urine specimen id no: [id number] Breath analysis complete.  Test no [id number], form no [id number]

## 2018-01-20 NOTE — ED Provider Notes (Signed)
[facility] Emergency Department Provider Note   First MD Initiated Contact with Patient 01/19/18 2333     (approximate)  I have reviewed the triage vital signs and the nursing notes.   HISTORY  Chief Complaint Headache; Altered Mental Status; and Hypertension    HPI Timothy Pham is a 52 y.o. male presents to the emergency department with market hypertension systolic blood pressure 230 with altered mental status and headache.  Patient states current headache is 10 out of 10 and generalized in nature.  Patient's supervisor at bedside states that the patient had difficulty performing his typical task at work and presented to his office markedly confused with nonsensical speech.  Patient states that while in route to work tonight he "did not know where to go".  Patient states that he knew of his diagnosis of hypertension however refused to take any medications.  Patient states that symptoms began about "5:00 yesterday evening".   Past medical history Hypertension Patient Active Problem List   Diagnosis Date Noted  . Headache 01/20/2018    Surgical history None  Prior to Admission medications   Medication Sig Start Date End Date Taking? Authorizing Provider  ARIPiprazole (ABILIFY) 10 MG tablet Take 10 mg by mouth daily. Take 0.5 tablet for 7 day, then increase to 1 tablet for 3 weeks. Pt is now on 1 tablet last fill on 11-06-17 11/06/17  Yes [provider]  loratadine (CLARITIN) 10 MG tablet Take 10 mg by mouth daily. 09/09/16  Yes [provider]  lovastatin (MEVACOR) 10 MG tablet Take 10 mg by mouth daily.  09/09/16  Yes [provider]  omeprazole (PRILOSEC) 20 MG capsule Take 20 mg by mouth 2 (two) times daily. 09/09/16  Yes [provider]  lamoTRIgine (LAMICTAL) 150 MG tablet Take 150 mg by mouth daily.    [provider]    Allergies Wellbutrin [bupropion]  Family History  Problem Relation Age of Onset    . Heart disease Mother   . Bipolar disorder Mother   . Heart disease Father   . Diabetes Mellitus II Father   . Obesity Sister   . Heart disease Brother     Social History Social History   Tobacco Use  . Smoking status: Current Every Day Smoker    Packs/day: 1.00    Types: Cigarettes  . Smokeless tobacco: Never Used  Substance Use Topics  . Alcohol use: No  . Drug use: No    Review of Systems Constitutional: No fever/chills Eyes: No visual changes. ENT: No sore throat. Cardiovascular: Denies chest pain. Respiratory: Denies shortness of breath. Gastrointestinal: No abdominal pain.  No nausea, no vomiting.  No diarrhea.  No constipation. Genitourinary: Negative for dysuria. Musculoskeletal: Negative for neck pain.  Negative for back pain. Integumentary: Negative for rash. Neurological: Positive for headache, confusion, speech abnormality  ____________________________________________   PHYSICAL EXAM:  VITAL SIGNS: ED Triage Vitals  Enc Vitals Group     BP 01/19/18 2342 (!) 213/124     Pulse Rate 01/19/18 2342 65     Resp 01/19/18 2342 (!) 22     Temp 01/19/18 2342 98.1 F (36.7 C)     Temp Source 01/19/18 2342 Oral     SpO2 01/19/18 2342 95 %     Weight 01/19/18 2343 77.6 kg (171 lb)     Height 01/19/18 2343 1.651 m (5\' 5" )     Head Circumference --      Peak Flow --  Pain Score 01/19/18 2343 8     Pain Loc --      Pain Edu? --      Excl. in GC? --     Constitutional: Alert and oriented. Well appearing and in no acute distress. Eyes: Conjunctivae are normal. PERRL. EOMI. Head: Atraumatic. Mouth/Throat: Mucous membranes are moist.  Oropharynx non-erythematous. Neck: No stridor. Cardiovascular: Normal rate, regular rhythm. Good peripheral circulation. Grossly normal heart sounds. Respiratory: Normal respiratory effort.  No retractions. Lungs CTAB. Gastrointestinal: Soft and nontender. No distention.  Musculoskeletal: No lower extremity tenderness nor  edema. No gross deformities of extremities. Neurologic:  Normal speech and language. No gross focal neurologic deficits are appreciated.  Skin:  Skin is warm, dry and intact. No rash noted. Psychiatric: Mood and affect are normal. Speech and behavior are normal.  ____________________________________________   LABS (all labs ordered are listed, but only abnormal results are displayed)  Labs Reviewed  BASIC METABOLIC PANEL - Abnormal; Notable for the following components:      Result Value   Glucose, Bld 107 (*)    All other components within normal limits  CBC - Abnormal; Notable for the following components:   WBC 12.8 (*)    All other components within normal limits  URINE DRUG SCREEN, QUALITATIVE ([facility] ONLY) - Abnormal; Notable for the following components:   Cannabinoid 50 Ng, Ur [place] POSITIVE (*)    All other components within normal limits  HEMOGLOBIN A1C - Abnormal; Notable for the following components:   Hgb A1c MFr Bld 5.9 (*)    All other components within normal limits  LIPID PANEL - Abnormal; Notable for the following components:   Cholesterol 266 (*)    Triglycerides 222 (*)    VLDL 44 (*)    LDL Cholesterol 169 (*)    All other components within normal limits  TROPONIN I  ETHANOL  TSH  CBC  BASIC METABOLIC PANEL  MAGNESIUM  PROTIME-INR  APTT  TYPE AND SCREEN   ____________________________________________  EKG  ED ECG REPORT I, [place] N Brayley Mackowiak, the attending physician, personally viewed and interpreted this ECG.   Date: 01/19/2018  EKG Time: 11:35 PM  Rate: 63  Rhythm: Normal sinus rhythm  Axis: Normal  Intervals: Normal  ST&T Change: None  ____________________________________________  RADIOLOGY I, Ona N Luchiano Viscomi, personally viewed and evaluated these images (plain radiographs) as part of my medical decision making, as well as reviewing the written report by the radiologist.  ED MD interpretation: CT angiogram of the head revealed multiple old  right frontoparietal infarcts radiologist.  Official radiology report(s): Ct Angio Head W Or Wo Contrast  Result Date: 01/20/2018 CLINICAL DATA:  Headache and difficulty concentrating for 2 days. Hypertensive. EXAM: CT ANGIOGRAPHY HEAD TECHNIQUE: Multidetector CT imaging of the head was performed using the standard protocol during bolus administration of intravenous contrast. Multiplanar CT image reconstructions and MIPs were obtained to evaluate the vascular anatomy. CONTRAST:  75mL OMNIPAQUE IOHEXOL 350 MG/ML SOLN COMPARISON:  None. FINDINGS: CT HEAD BRAIN: No intraparenchymal hemorrhage, mass effect nor midline shift. Linear RIGHT frontal and RIGHT parietal encephalomalacia. Smaller RIGHT inferior frontal lobe encephalomalacia. The ventricles and sulci are normal. No acute large vascular territory infarcts. No abnormal extra-axial fluid collections. Basal cisterns are patent. VASCULAR: Unremarkable. SKULL/SOFT TISSUES: No skull fracture. No significant soft tissue swelling. ORBITS/SINUSES: The included ocular globes and orbital contents are normal.Trace paranasal sinus mucosal thickening. Mastoid air cells are well aerated. OTHER: None. CTA HEAD ANTERIOR CIRCULATION: Diminutive LEFT petrous ICA,  tine versus occluded in the cervical portion. RIGHT ICA is dominant. No large vessel occlusion, significant stenosis, contrast extravasation or aneurysm. POSTERIOR CIRCULATION: Patent vertebral arteries, vertebrobasilar junction and basilar artery, as well as main branch vessels. Patent posterior cerebral arteries. Robust LEFT posterior communicating artery present. Fetal origin LEFT PCA. No large vessel occlusion, significant stenosis, contrast extravasation or aneurysm. VENOUS SINUSES: Major dural venous sinuses are patent though not tailored for evaluation on this angiographic examination. ANATOMIC VARIANTS: None. DELAYED PHASE: Not performed. MIP images reviewed. IMPRESSION: CT HEAD: 1. No acute intracranial  process. 2. 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 Findings may be better demonstrated on non emergent MRI head. CTA HEAD: 1. No emergent large vessel occlusion or flow-limiting stenosis. 2. Asymmetrically smaller LEFT ICA, potentially occluded in the neck. Recommend CTA versus MRA neck on non emergent basis. Electronically Signed   By: Awilda Metro M.D.   On: 01/20/2018 02:15   Mr Maxine Glenn Neck W Wo Contrast  Result Date: 01/20/2018 CLINICAL DATA:  TIA.  Hypertension and headache. EXAM: MR HEAD WITHOUT CONTRAST MR CIRCLE OF WILLIS WITHOUT CONTRAST MRA OF THE NECK WITHOUT AND WITH CONTRAST TECHNIQUE: Multiplanar, multiecho pulse sequences of the brain, circle of willis and surrounding structures were obtained without intravenous contrast. Angiographic images of the neck were obtained using MRA technique without and with intravenous contrast. 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 This extends posteriorly into the occipital lobe. No acute infarct in the left hemisphere. Negative for hemorrhage or mass. Ventricle size normal.  No midline shift. Vascular: Abnormal left cavernous carotid which is very small likely due to slow flow. Right cavernous carotid normal flow void. Remainder circle-of-Willis normal flow void. Skull and upper cervical spine: Negative Sinuses/Orbits: Retention cyst right maxillary sinus.  Normal orbit. Other: None MR CIRCLE OF WILLIS FINDINGS Minimal flow related signal in the left cervical carotid and left cavernous carotid due to severe proximal stenosis. Left cavernous carotid not well evaluated due to slow flow. Right cavernous carotid normal. Anterior and middle cerebral arteries patent bilaterally. Mild stenosis A1 segment bilaterally. Moderate stenosis in the posterior division of the right middle  cerebral artery. Both vertebral arteries patent to the basilar. PICA patent bilaterally. Decreased signal distal basilar appears to be artifact from motion. Fetal origin of the posterior cerebral artery bilaterally. Hypoplastic P1 segment bilaterally. Superior cerebellar artery patent bilaterally. MRA NECK FINDINGS Normal aortic arch. Standard three-vessel branching of the aortic arch. Mild stenosis proximal right common carotid artery which could be due to atherosclerotic disease or possibly artifact. Greater than 60% diameter stenosis proximal right internal carotid artery which is then patent without additional stenosis. Critical stenosis at the origin of the left internal carotid artery. Faint flow related signal in the left cervical internal carotid artery Both vertebral arteries patent to the basilar. Decreased signal at the origin bilaterally could be due to moderate stenosis or artifact from motion IMPRESSION: 1. Acute infarct right frontal parietal lobe in a watershed territory distribution. No hemorrhage. 2. Greater than 60% diameter stenosis proximal right internal carotid artery. 3. Critical stenosis proximal left internal carotid artery with trickle flow to the skull base. 4. Mild stenosis A1 segment bilaterally. Moderate stenosis posterior division of the right middle cerebral artery. 5. Decreased signal at the origin of the vertebral artery which could be due to stenosis versus artifact. 6.  These results were called by telephone at the time of interpretation on 01/20/2018 at 12:45 pm to Dr. Cherlynn Kaiser , who verbally acknowledged these results. Electronically Signed   By: Marlan Palau M.D.   On: 01/20/2018 12:46   Mr Brain Wo Contrast  Result Date: 01/20/2018 CLINICAL DATA:  TIA.  Hypertension and headache. EXAM: MR HEAD WITHOUT CONTRAST MR CIRCLE OF WILLIS WITHOUT CONTRAST MRA OF THE NECK WITHOUT AND WITH CONTRAST TECHNIQUE: Multiplanar, multiecho pulse sequences of the brain, circle of willis and  surrounding structures were obtained without intravenous contrast. Angiographic images of the neck were obtained using MRA technique without and with intravenous contrast. 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 This extends posteriorly into the occipital lobe. No acute infarct in the left hemisphere. Negative for hemorrhage or mass. Ventricle size normal.  No midline shift. Vascular: Abnormal left cavernous carotid which is very small likely due to slow flow. Right cavernous carotid normal flow void. Remainder circle-of-Willis normal flow void. Skull and upper cervical spine: Negative Sinuses/Orbits: Retention cyst right maxillary sinus.  Normal orbit. Other: None MR CIRCLE OF WILLIS FINDINGS Minimal flow related signal in the left cervical carotid and left cavernous carotid due to severe proximal stenosis. Left cavernous carotid not well evaluated due to slow flow. Right cavernous carotid normal. Anterior and middle cerebral arteries patent bilaterally. Mild stenosis A1 segment bilaterally. Moderate stenosis in the posterior division of the right middle cerebral artery. Both vertebral arteries patent to the basilar. PICA patent bilaterally. Decreased signal distal basilar appears to be artifact from motion. Fetal origin of the posterior cerebral artery bilaterally. Hypoplastic P1 segment bilaterally. Superior cerebellar artery patent bilaterally. MRA NECK FINDINGS Normal aortic arch. Standard three-vessel branching of the aortic arch. Mild stenosis proximal right common carotid artery which could be due to atherosclerotic disease or possibly artifact. Greater than 60% diameter stenosis proximal right internal carotid artery which is then patent without additional stenosis. Critical stenosis at the origin of the left internal carotid artery. Faint flow related signal in the  left cervical internal carotid artery Both vertebral arteries patent to the basilar. Decreased signal at the origin bilaterally could be due to moderate stenosis or artifact from motion IMPRESSION: 1. Acute infarct right frontal parietal lobe in a watershed territory distribution. No hemorrhage. 2. Greater than 60% diameter stenosis proximal right internal carotid artery. 3. Critical stenosis proximal left internal carotid artery with trickle flow to the skull base. 4. Mild stenosis A1 segment bilaterally. Moderate stenosis posterior division of the right middle cerebral artery. 5. Decreased signal at the origin of the vertebral artery which could be due to stenosis versus artifact. 6. These results were called by telephone at the time of interpretation on 01/20/2018 at 12:45 pm to Dr. Cherlynn Kaiser , who verbally acknowledged these results. Electronically Signed   By: Marlan Palau M.D.   On: 01/20/2018 12:46   US Carotid Bilateral (at [facility] Only)  Result Date: 01/20/2018 CLINICAL DATA:  TIA. Hypertension, syncope, visual disturbance, hyperlipidemia, previous tobacco abuse. EXAM: BILATERAL CAROTID DUPLEX ULTRASOUND TECHNIQUE: Wallace Cullens scale imaging, color Doppler and duplex ultrasound were performed of bilateral carotid and vertebral arteries in the neck. COMPARISON:  None. FINDINGS: Criteria: Quantification of carotid stenosis is based on velocity parameters that correlate the residual internal carotid diameter with NASCET-based stenosis levels, using the diameter of the distal internal carotid lumen  as the denominator for stenosis measurement. The following velocity measurements were obtained: RIGHT ICA: 546/227 cm/sec CCA: 48/12 cm/sec SYSTOLIC ICA/CCA RATIO:  11.4 ECA: 84 cm/sec LEFT ICA: 36/18 cm/sec CCA: 55/11 cm/sec SYSTOLIC ICA/CCA RATIO:  0.7 ECA: 182 cm/sec RIGHT CAROTID ARTERY: Scattered partially calcified plaque through the common carotid artery. Circumferential somewhat irregular and partially  calcified plaque in the bulb extending into proximal internal and external carotid arteries. High-grade stenosis in the proximal ICA with markedly elevated peak systolic velocities, spectral broadening, in focal aliasing on color Doppler. Elsewhere, normal waveforms and color Doppler signal. RIGHT VERTEBRAL ARTERY:  Normal flow direction and waveform. LEFT CAROTID ARTERY: Mild smooth plaque in the common carotid artery. Eccentric partially calcified plaque in the bulb and bifurcation. The lumen of the ICAs poorly visualized with no normal waveform. Possible string sign noted on power Doppler in the distal ICA. LEFT VERTEBRAL ARTERY:  Normal flow direction and waveform. IMPRESSION: 1. Right carotid bifurcation plaque resulting in near occlusive proximal ICA stenosis. Recommend vascular surgical or neurointerventional radiology consultation. 2. 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 See above recommendation. 3.  Antegrade bilateral vertebral arterial flow. Electronically Signed   By: Corlis Leak M.D.   On: 01/20/2018 10:58   Dg Chest Port 1 View  Result Date: 01/19/2018 CLINICAL DATA:  Headache, hypertensive. EXAM: PORTABLE CHEST 1 VIEW COMPARISON:  Chest radiograph Jun 28, 2017 FINDINGS: Cardiomediastinal silhouette is normal. No pleural effusions or focal consolidations. Mild chronic bronchitic changes. Trachea projects midline and there is no pneumothorax. Soft tissue planes and included osseous structures are non-suspicious. Old RIGHT rib fractures. IMPRESSION: Mild chronic bronchitic changes. Electronically Signed   By: Awilda Metro M.D.   On: 01/19/2018 23:49   Mr Maxine Glenn Head Wo Contrast  Result Date: 01/20/2018 CLINICAL DATA:  TIA.  Hypertension and headache. EXAM: MR HEAD WITHOUT CONTRAST MR CIRCLE OF WILLIS WITHOUT CONTRAST MRA OF THE NECK WITHOUT AND WITH CONTRAST TECHNIQUE: Multiplanar, multiecho pulse sequences of the  brain, circle of willis and surrounding structures were obtained without intravenous contrast. Angiographic images of the neck were obtained using MRA technique without and with intravenous contrast. 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 This extends posteriorly into the occipital lobe. No acute infarct in the left hemisphere. Negative for hemorrhage or mass. Ventricle size normal.  No midline shift. Vascular: Abnormal left cavernous carotid which is very small likely due to slow flow. Right cavernous carotid normal flow void. Remainder circle-of-Willis normal flow void. Skull and upper cervical spine: Negative Sinuses/Orbits: Retention cyst right maxillary sinus.  Normal orbit. Other: None MR CIRCLE OF WILLIS FINDINGS Minimal flow related signal in the left cervical carotid and left cavernous carotid due to severe proximal stenosis. Left cavernous carotid not well evaluated due to slow flow. Right cavernous carotid normal. Anterior and middle cerebral arteries patent bilaterally. Mild stenosis A1 segment bilaterally. Moderate stenosis in the posterior division of the right middle cerebral artery. Both vertebral arteries patent to the basilar. PICA patent bilaterally. Decreased signal distal basilar appears to be artifact from motion. Fetal origin of the posterior cerebral artery bilaterally. Hypoplastic P1 segment bilaterally. Superior cerebellar artery patent bilaterally. MRA NECK FINDINGS Normal aortic arch. Standard three-vessel branching of the aortic arch. Mild stenosis proximal right common carotid artery which could be due to atherosclerotic  disease or possibly artifact. Greater than 60% diameter stenosis proximal right internal carotid artery which is then patent without additional stenosis. Critical stenosis at the origin of the left internal carotid artery.  Faint flow related signal in the left cervical internal carotid artery Both vertebral arteries patent to the basilar. Decreased signal at the origin bilaterally could be due to moderate stenosis or artifact from motion IMPRESSION: 1. Acute infarct right frontal parietal lobe in a watershed territory distribution. No hemorrhage. 2. Greater than 60% diameter stenosis proximal right internal carotid artery. 3. Critical stenosis proximal left internal carotid artery with trickle flow to the skull base. 4. Mild stenosis A1 segment bilaterally. Moderate stenosis posterior division of the right middle cerebral artery. 5. Decreased signal at the origin of the vertebral artery which could be due to stenosis versus artifact. 6. These results were called by telephone at the time of interpretation on 01/20/2018 at 12:45 pm to Dr. Cherlynn Kaiser , who verbally acknowledged these results. Electronically Signed   By: Marlan Palau M.D.   On: 01/20/2018 12:46      Critical Care performed:   .Critical Care Performed by: Darci Current, MD Authorized by: Darci Current, MD   Critical care provider statement:    Critical care time (minutes):  60   Critical care time was exclusive of:  Separately billable procedures and treating other patients and teaching time (Hypertensive emergency)   Critical care was necessary to treat or prevent imminent or life-threatening deterioration of the following conditions:  CNS failure or compromise   Critical care was time spent personally by me on the following activities:  Development of treatment plan with patient or surrogate, discussions with consultants, evaluation of patient's response to treatment, examination of patient, obtaining history from patient or surrogate, ordering and performing treatments and interventions, ordering and review of laboratory studies, ordering and review of radiographic studies, pulse oximetry, re-evaluation of patient's condition and review of old  charts   I assumed direction of critical care for this patient from another provider in my specialty: no       ____________________________________________   INITIAL IMPRESSION / ASSESSMENT AND PLAN / ED COURSE  As part of my medical decision making, I reviewed the following data within the electronic MEDICAL RECORD NUMBER 52 year old male presented with above-stated history and physical exam concerning for cerebrovascular accident in the setting of markedly elevated blood pressure.  Nitropaste applied to the patient's chest 1 inch with improvement of the patient's blood pressure.  Patient beyond therapeutic window and as such stroke protocol not initiated however CT angiogram of the brain performed given market concern for CVA.  CT angiogram per radiologist revealed multiple old frontal parietal infarcts on the right however I am concerned of the patient may have had an acute CVA in addition today and as such spoke with the patient at length regarding the necessity of being admitted.  Patient requesting to leave AMA and as such lengthy conversation with the patient ensued explaining to the patient the potential perils of doing so including permanent disability and antral mortality. Patient discussed with Dr Anne Hahn for hospital admission for further evaluation and management.     ____________________________________________  FINAL CLINICAL IMPRESSION(S) / ED DIAGNOSES  Cerebrovascular accident  MEDICATIONS GIVEN DURING THIS VISIT:  Medications  nicotine (NICODERM CQ - dosed in mg/24 hours) patch 21 mg (21 mg Transdermal Patch Applied 01/20/18 0351)  loratadine (CLARITIN) tablet 10 mg (10 mg Oral Given 01/20/18 1146)  pantoprazole (PROTONIX) EC tablet  40 mg (40 mg Oral Given 01/20/18 1145)  enoxaparin (LOVENOX) injection 40 mg (40 mg Subcutaneous Given 01/20/18 0757)  acetaminophen (TYLENOL) tablet 650 mg (has no administration in time range)    Or  acetaminophen (TYLENOL) suppository 650 mg  (has no administration in time range)  docusate sodium (COLACE) capsule 100 mg (100 mg Oral Given 01/20/18 2148)  ondansetron (ZOFRAN) tablet 4 mg (has no administration in time range)    Or  ondansetron (ZOFRAN) injection 4 mg (has no administration in time range)   stroke: mapping our early stages of recovery book (has no administration in time range)  aspirin suppository 300 mg ( Rectal See Alternative 01/20/18 1312)    Or  aspirin tablet 325 mg (325 mg Oral Given 01/20/18 1312)  atorvastatin (LIPITOR) tablet 40 mg (40 mg Oral Given 01/20/18 1834)  hydrALAZINE (APRESOLINE) injection 10 mg (has no administration in time range)  lisinopril (PRINIVIL,ZESTRIL) tablet 10 mg (has no administration in time range)  amLODipine (NORVASC) tablet 5 mg (5 mg Oral Given 01/20/18 1312)  ARIPiprazole (ABILIFY) tablet 10 mg (has no administration in time range)  0.9 %  sodium chloride infusion (has no administration in time range)  ceFAZolin (ANCEF) IVPB 2g/100 mL premix (has no administration in time range)  nitroGLYCERIN (NITROGLYN) 2 % ointment (1 inch  Given 01/19/18 2350)  iohexol (OMNIPAQUE) 350 MG/ML injection 75 mL (75 mLs Intravenous Contrast Given 01/20/18 0144)  gadobutrol (GADAVIST) 1 MMOL/ML injection 7.5 mL (7.5 mLs Intravenous Contrast Given 01/20/18 1221)     ED Discharge Orders    None       Note:  This document was prepared using Dragon voice recognition software and may include unintentional dictation errors.    Darci Current, MD 01/20/18 2240

## 2018-01-20 NOTE — ED Notes (Signed)
ED TO INPATIENT HANDOFF REPORT  Name/Age/Gender Timothy Pham 52 y.o. male  Code Status    Code Status Orders  (From admission, onward)         Start     Ordered   01/20/18 0434  Full code  Continuous     01/20/18 0433        Code Status History    This patient has a current code status but no historical code status.      Home/SNF/Other home  Chief Complaint Hypertension  Level of Care/Admitting Diagnosis ED Disposition    ED Disposition Condition Comment   Admit  Hospital Area: [facility] [[id number]]  Level of Care: Med-Surg [16]  Diagnosis: Headache [[id number]]  Admitting Physician: Arnaldo Natal [[id number]]  Attending Physician: Arnaldo Natal [[id number]]  PT Class (Do Not Modify): Observation [104]  PT Acc Code (Do Not Modify): Observation [10022]       Medical History History reviewed. No pertinent past medical history.  Allergies Allergies  Allergen Reactions  . Wellbutrin [Bupropion] Itching    IV Location/Drains/Wounds Patient Lines/Drains/Airways Status   Active Line/Drains/Airways    Name:   Placement date:   Placement time:   Site:   Days:   Peripheral IV 01/19/18 Left Hand   01/19/18    2322    Hand   1   Peripheral IV 01/20/18 Right;Upper Arm   01/20/18    0039    Arm   less than 1          Labs/Imaging Results for orders placed or performed during the hospital encounter of 01/19/18 (from the past 48 hour(s))  Basic metabolic panel     Status: Abnormal   Collection Time: 01/19/18 11:47 PM  Result Value Ref Range   Sodium 143 135 - 145 mmol/L   Potassium 3.9 3.5 - 5.1 mmol/L   Chloride 106 98 - 111 mmol/L   CO2 24 22 - 32 mmol/L   Glucose, Bld 107 (H) 70 - 99 mg/dL   BUN 15 6 - 20 mg/dL   Creatinine, Ser 9.56 0.61 - 1.24 mg/dL   Calcium 9.7 8.9 - 21.3 mg/dL   GFR calc non Af Amer >60 >60 mL/min   GFR calc Af Amer >60 >60 mL/min   Anion gap 13 5 - 15    Comment: Performed at [facility], [address] [place], [place] [address]  CBC     Status: Abnormal   Collection Time: 01/19/18 11:47 PM  Result Value Ref Range   WBC 12.8 (H) 4.0 - 10.5 K/uL   RBC 4.83 4.22 - 5.81 MIL/uL   Hemoglobin 15.3 13.0 - 17.0 g/dL   HCT 84.6 96.2 - 95.2 %   MCV 93.6 80.0 - 100.0 fL   MCH 31.7 26.0 - 34.0 pg   MCHC 33.8 30.0 - 36.0 g/dL   RDW 84.1 32.4 - 40.1 %   Platelets 299 150 - 400 K/uL   nRBC 0.0 0.0 - 0.2 %    Comment: Performed at [facility], [address] [place], [place] [address]  Troponin I - Once     Status: None   Collection Time: 01/19/18 11:47 PM  Result Value Ref Range   Troponin I <0.03 <0.03 ng/mL    Comment: Performed at [facility], [address] [place], [place] [address]  Ethanol     Status: None   Collection Time: 01/20/18 12:06 AM  Result Value Ref Range  Alcohol, Ethyl (B) <10 <10 mg/dL    Comment: (NOTE) Lowest detectable limit for serum alcohol is 10 mg/dL. For medical purposes only. Performed at [facility], [address], [place], [place] [address]   Urine Drug Screen, [facility] only)     Status: Abnormal   Collection Time: 01/20/18 12:06 AM  Result Value Ref Range   Tricyclic, Ur Screen NONE DETECTED NONE DETECTED   Amphetamines, Ur Screen NONE DETECTED NONE DETECTED   MDMA (Ecstasy)Ur Screen NONE DETECTED NONE DETECTED   Cocaine Metabolite,Ur [place] NONE DETECTED NONE DETECTED   Opiate, Ur Screen NONE DETECTED NONE DETECTED   Phencyclidine (PCP) Ur S NONE DETECTED NONE DETECTED   Cannabinoid 50 Ng, Ur [place] POSITIVE (A) NONE DETECTED   Barbiturates, Ur Screen NONE DETECTED NONE DETECTED   Benzodiazepine, Ur Scrn NONE DETECTED NONE DETECTED   Methadone Scn, Ur NONE DETECTED NONE DETECTED    Comment: (NOTE) Tricyclics + metabolites, urine    Cutoff 1000 ng/mL Amphetamines + metabolites, urine  Cutoff 1000 ng/mL MDMA (Ecstasy), urine              Cutoff 500 ng/mL Cocaine Metabolite, urine          Cutoff 300  ng/mL Opiate + metabolites, urine        Cutoff 300 ng/mL Phencyclidine (PCP), urine         Cutoff 25 ng/mL Cannabinoid, urine                 Cutoff 50 ng/mL Barbiturates + metabolites, urine  Cutoff 200 ng/mL Benzodiazepine, urine              Cutoff 200 ng/mL Methadone, urine                   Cutoff 300 ng/mL The urine drug screen provides only a preliminary, unconfirmed analytical test result and should not be used for non-medical purposes. Clinical consideration and professional judgment should be applied to any positive drug screen result due to possible interfering substances. A more specific alternate chemical method must be used in order to obtain a confirmed analytical result. Gas chromatography / mass spectrometry (GC/MS) is the preferred confirmat ory method. Performed at [facility], [address] [place], [place] [address]    Ct Angio Head W Or Wo Contrast  Result Date: 01/20/2018 CLINICAL DATA:  Headache and difficulty concentrating for 2 days. Hypertensive. EXAM: CT ANGIOGRAPHY HEAD TECHNIQUE: Multidetector CT imaging of the head was performed using the standard protocol during bolus administration of intravenous contrast. Multiplanar CT image reconstructions and MIPs were obtained to evaluate the vascular anatomy. CONTRAST:  75mL OMNIPAQUE IOHEXOL 350 MG/ML SOLN COMPARISON:  None. FINDINGS: CT HEAD BRAIN: No intraparenchymal hemorrhage, mass effect nor midline shift. Linear RIGHT frontal and RIGHT parietal encephalomalacia. Smaller RIGHT inferior frontal lobe encephalomalacia. The ventricles and sulci are normal. No acute large vascular territory infarcts. No abnormal extra-axial fluid collections. Basal cisterns are patent. VASCULAR: Unremarkable. SKULL/SOFT TISSUES: No skull fracture. No significant soft tissue swelling. ORBITS/SINUSES: The included ocular globes and orbital contents are normal.Trace paranasal sinus mucosal thickening. Mastoid air cells are  well aerated. OTHER: None. CTA HEAD ANTERIOR CIRCULATION: Diminutive LEFT petrous ICA, tine versus occluded in the cervical portion. RIGHT ICA is dominant. No large vessel occlusion, significant stenosis, contrast extravasation or aneurysm. POSTERIOR CIRCULATION: Patent vertebral arteries, vertebrobasilar junction and basilar artery, as well as main branch vessels. Patent posterior cerebral arteries. Robust LEFT posterior communicating artery present. Fetal  origin LEFT PCA. No large vessel occlusion, significant stenosis, contrast extravasation or aneurysm. VENOUS SINUSES: Major dural venous sinuses are patent though not tailored for evaluation on this angiographic examination. ANATOMIC VARIANTS: None. DELAYED PHASE: Not performed. MIP images reviewed. IMPRESSION: CT HEAD: 1. No acute intracranial process. 2. 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 Findings may be better demonstrated on non emergent MRI head. CTA HEAD: 1. No emergent large vessel occlusion or flow-limiting stenosis. 2. Asymmetrically smaller LEFT ICA, potentially occluded in the neck. Recommend CTA versus MRA neck on non emergent basis. Electronically Signed   By: Awilda Metroourtnay  Bloomer M.D.   On: 01/20/2018 02:15   Dg Chest Port 1 View  Result Date: 01/19/2018 CLINICAL DATA:  Headache, hypertensive. EXAM: PORTABLE CHEST 1 VIEW COMPARISON:  Chest radiograph Jun 28, 2017 FINDINGS: Cardiomediastinal silhouette is normal. No pleural effusions or focal consolidations. Mild chronic bronchitic changes. Trachea projects midline and there is no pneumothorax. Soft tissue planes and included osseous structures are non-suspicious. Old RIGHT rib fractures. IMPRESSION: Mild chronic bronchitic changes. Electronically Signed   By: Awilda Metroourtnay  Bloomer M.D.   On: 01/19/2018 23:49    Pending Labs Unresulted Labs (From admission, onward)    Start     Ordered   01/27/18 0500  Creatinine, serum  (enoxaparin (LOVENOX)    CrCl  >/= 30 ml/min)  Weekly,   STAT    Comments:  while on enoxaparin therapy    01/20/18 0433   01/20/18 0500  Lipid panel  Tomorrow morning,   STAT    Comments:  Fasting    01/20/18 0433   01/20/18 0434  TSH  Add-on,   AD     01/20/18 0433   01/20/18 0434  Hemoglobin A1c  Add-on,   AD     01/20/18 0433          Vitals/Pain Today's Vitals   01/20/18 0030 01/20/18 0130 01/20/18 0310 01/20/18 0320  BP: (!) 150/88 (!) 176/87 (!) 177/93 (!) 170/95  Pulse: 65 60 (!) 59 64  Resp: 20 (!) 25 (!) 21 20  Temp:      TempSrc:      SpO2: 96% 96% 95% 95%  Weight:      Height:      PainSc:        Isolation Precautions No active isolations  Medications Medications  nicotine (NICODERM CQ - dosed in mg/24 hours) patch 21 mg (21 mg Transdermal Patch Applied 01/20/18 0351)  pravastatin (PRAVACHOL) tablet 20 mg (has no administration in time range)  loratadine (CLARITIN) tablet 10 mg (has no administration in time range)  pantoprazole (PROTONIX) EC tablet 40 mg (has no administration in time range)  enoxaparin (LOVENOX) injection 40 mg (has no administration in time range)  acetaminophen (TYLENOL) tablet 650 mg (has no administration in time range)    Or  acetaminophen (TYLENOL) suppository 650 mg (has no administration in time range)  docusate sodium (COLACE) capsule 100 mg (has no administration in time range)  ondansetron (ZOFRAN) tablet 4 mg (has no administration in time range)    Or  ondansetron (ZOFRAN) injection 4 mg (has no administration in time range)   stroke: mapping our early stages of recovery book (has no administration in time range)  aspirin suppository 300 mg (has no administration in time range)    Or  aspirin tablet 325 mg (has no administration in time range)  nitroGLYCERIN (NITROGLYN) 2 % ointment (1 inch  Given 01/19/18 2350)  iohexol (OMNIPAQUE) 350  MG/ML injection 75 mL (75 mLs Intravenous Contrast Given 01/20/18 0144)    Mobility independent

## 2018-01-20 NOTE — Progress Notes (Signed)
[facility] [place] [facility]      PATIENT NAME: Timothy Pham    MR#:  [id number]  DATE OF BIRTH:  1965/06/18  SUBJECTIVE:   Patient presented to the hospital due to headache, accelerated hypertension and altered mental status and suspected to have a CVA.  Presently patient denies a headache, nausea or vomiting.  He denies any numbness tingling or any focal weakness.  REVIEW OF SYSTEMS:    Review of Systems  Constitutional: Negative for chills and fever.  HENT: Negative for congestion and tinnitus.   Eyes: Negative for blurred vision and double vision.  Respiratory: Negative for cough, shortness of breath and wheezing.   Cardiovascular: Negative for chest pain, orthopnea and PND.  Gastrointestinal: Negative for abdominal pain, diarrhea, nausea and vomiting.  Genitourinary: Negative for dysuria and hematuria.  Neurological: Negative for dizziness, sensory change and focal weakness.  All other systems reviewed and are negative.   Nutrition: Heart Healthy Tolerating Diet: Yes Tolerating PT: Await Eval.   DRUG ALLERGIES:   Allergies  Allergen Reactions  . Wellbutrin [Bupropion] Itching    VITALS:  Blood pressure (!) 161/74, pulse 69, temperature 97.8 F (36.6 C), temperature source Oral, resp. rate 20, height 5\' 5"  (1.651 m), weight 78.2 kg, SpO2 96 %.  PHYSICAL EXAMINATION:   Physical Exam  GENERAL:  52 y.o.-year-old patient lying in bed in no acute distress.  EYES: Pupils equal, round, reactive to light and accommodation. No scleral icterus. Extraocular muscles intact.  HEENT: Head atraumatic, normocephalic. Oropharynx and nasopharynx clear.  NECK:  Supple, no jugular venous distention. No thyroid enlargement, no tenderness.  LUNGS: Normal breath sounds bilaterally, no wheezing, rales, rhonchi. No use of accessory muscles of respiration.  CARDIOVASCULAR: S1, S2 normal. No murmurs, rubs, or gallops.  ABDOMEN: Soft, nontender, nondistended. Bowel  sounds present. No organomegaly or mass.  EXTREMITIES: No cyanosis, clubbing or edema b/l.    NEUROLOGIC: Cranial nerves II through XII are intact. No focal Motor or sensory deficits b/l.   PSYCHIATRIC: The patient is alert and oriented x 3.  SKIN: No obvious rash, lesion, or ulcer.    LABORATORY PANEL:   CBC Recent Labs  Lab 01/19/18 2347  WBC 12.8*  HGB 15.3  HCT 45.2  PLT 299   ------------------------------------------------------------------------------------------------------------------  Chemistries  Recent Labs  Lab 01/19/18 2347  NA 143  K 3.9  CL 106  CO2 24  GLUCOSE 107*  BUN 15  CREATININE 0.88  CALCIUM 9.7   ------------------------------------------------------------------------------------------------------------------  Cardiac Enzymes Recent Labs  Lab 01/19/18 2347  TROPONINI <0.03   ------------------------------------------------------------------------------------------------------------------  RADIOLOGY:  Ct Angio Head W Or Wo Contrast  Result Date: 01/20/2018 CLINICAL DATA:  Headache and difficulty concentrating for 2 days. Hypertensive. EXAM: CT ANGIOGRAPHY HEAD TECHNIQUE: Multidetector CT imaging of the head was performed using the standard protocol during bolus administration of intravenous contrast. Multiplanar CT image reconstructions and MIPs were obtained to evaluate the vascular anatomy. CONTRAST:  75mL OMNIPAQUE IOHEXOL 350 MG/ML SOLN COMPARISON:  None. FINDINGS: CT HEAD BRAIN: No intraparenchymal hemorrhage, mass effect nor midline shift. Linear RIGHT frontal and RIGHT parietal encephalomalacia. Smaller RIGHT inferior frontal lobe encephalomalacia. The ventricles and sulci are normal. No acute large vascular territory infarcts. No abnormal extra-axial fluid collections. Basal cisterns are patent. VASCULAR: Unremarkable. SKULL/SOFT TISSUES: No skull fracture. No significant soft tissue swelling. ORBITS/SINUSES: The included ocular globes and  orbital contents are normal.Trace paranasal sinus mucosal thickening. Mastoid air cells are well aerated. OTHER: None. CTA HEAD ANTERIOR  CIRCULATION: Diminutive LEFT petrous ICA, tine versus occluded in the cervical portion. RIGHT ICA is dominant. No large vessel occlusion, significant stenosis, contrast extravasation or aneurysm. POSTERIOR CIRCULATION: Patent vertebral arteries, vertebrobasilar junction and basilar artery, as well as main branch vessels. Patent posterior cerebral arteries. Robust LEFT posterior communicating artery present. Fetal origin LEFT PCA. No large vessel occlusion, significant stenosis, contrast extravasation or aneurysm. VENOUS SINUSES: Major dural venous sinuses are patent though not tailored for evaluation on this angiographic examination. ANATOMIC VARIANTS: None. DELAYED PHASE: Not performed. MIP images reviewed. IMPRESSION: CT HEAD: 1. No acute intracranial process. 2. 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 Findings may be better demonstrated on non emergent MRI head. CTA HEAD: 1. No emergent large vessel occlusion or flow-limiting stenosis. 2. Asymmetrically smaller LEFT ICA, potentially occluded in the neck. Recommend CTA versus MRA neck on non emergent basis. Electronically Signed   By: Awilda Metro M.D.   On: 01/20/2018 02:15   Mr Maxine Glenn Neck W Wo Contrast  Result Date: 01/20/2018 CLINICAL DATA:  TIA.  Hypertension and headache. EXAM: MR HEAD WITHOUT CONTRAST MR CIRCLE OF WILLIS WITHOUT CONTRAST MRA OF THE NECK WITHOUT AND WITH CONTRAST TECHNIQUE: Multiplanar, multiecho pulse sequences of the brain, circle of willis and surrounding structures were obtained without intravenous contrast. Angiographic images of the neck were obtained using MRA technique without and with intravenous contrast. CONTRAST:  7.5 mL Gadovist IV COMPARISON:  CTA head and neck 01/20/2018 FINDINGS: MR HEAD FINDINGS Brain: Acute infarct in the right frontal parietal  lobe in a watershed distribution. This extends posteriorly into the occipital lobe. No acute infarct in the left hemisphere. Negative for hemorrhage or mass. Ventricle size normal.  No midline shift. Vascular: Abnormal left cavernous carotid which is very small likely due to slow flow. Right cavernous carotid normal flow void. Remainder circle-of-Willis normal flow void. Skull and upper cervical spine: Negative Sinuses/Orbits: Retention cyst right maxillary sinus.  Normal orbit. Other: None MR CIRCLE OF WILLIS FINDINGS Minimal flow related signal in the left cervical carotid and left cavernous carotid due to severe proximal stenosis. Left cavernous carotid not well evaluated due to slow flow. Right cavernous carotid normal. Anterior and middle cerebral arteries patent bilaterally. Mild stenosis A1 segment bilaterally. Moderate stenosis in the posterior division of the right middle cerebral artery. Both vertebral arteries patent to the basilar. PICA patent bilaterally. Decreased signal distal basilar appears to be artifact from motion. Fetal origin of the posterior cerebral artery bilaterally. Hypoplastic P1 segment bilaterally. Superior cerebellar artery patent bilaterally. MRA NECK FINDINGS Normal aortic arch. Standard three-vessel branching of the aortic arch. Mild stenosis proximal right common carotid artery which could be due to atherosclerotic disease or possibly artifact. Greater than 60% diameter stenosis proximal right internal carotid artery which is then patent without additional stenosis. Critical stenosis at the origin of the left internal carotid artery. Faint flow related signal in the left cervical internal carotid artery Both vertebral arteries patent to the basilar. Decreased signal at the origin bilaterally could be due to moderate stenosis or artifact from motion IMPRESSION: 1. Acute infarct right frontal parietal lobe in a watershed territory distribution. No hemorrhage. 2. Greater than 60%  diameter stenosis proximal right internal carotid artery. 3. Critical stenosis proximal left internal carotid artery with trickle flow to the skull base. 4. Mild stenosis A1 segment bilaterally. Moderate stenosis posterior division of the right middle cerebral artery. 5. Decreased signal at the origin of the vertebral artery which could be due  to stenosis versus artifact. 6. These results were called by telephone at the time of interpretation on 01/20/2018 at 12:45 pm to Dr. Cherlynn Kaiser , who verbally acknowledged these results. Electronically Signed   By: Marlan Palau M.D.   On: 01/20/2018 12:46   Mr Brain Wo Contrast  Result Date: 01/20/2018 CLINICAL DATA:  TIA.  Hypertension and headache. EXAM: MR HEAD WITHOUT CONTRAST MR CIRCLE OF WILLIS WITHOUT CONTRAST MRA OF THE NECK WITHOUT AND WITH CONTRAST TECHNIQUE: Multiplanar, multiecho pulse sequences of the brain, circle of willis and surrounding structures were obtained without intravenous contrast. Angiographic images of the neck were obtained using MRA technique without and with intravenous contrast. 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 This extends posteriorly into the occipital lobe. No acute infarct in the left hemisphere. Negative for hemorrhage or mass. Ventricle size normal.  No midline shift. Vascular: Abnormal left cavernous carotid which is very small likely due to slow flow. Right cavernous carotid normal flow void. Remainder circle-of-Willis normal flow void. Skull and upper cervical spine: Negative Sinuses/Orbits: Retention cyst right maxillary sinus.  Normal orbit. Other: None MR CIRCLE OF WILLIS FINDINGS Minimal flow related signal in the left cervical carotid and left cavernous carotid due to severe proximal stenosis. Left cavernous carotid not well evaluated due to slow flow. Right cavernous carotid  normal. Anterior and middle cerebral arteries patent bilaterally. Mild stenosis A1 segment bilaterally. Moderate stenosis in the posterior division of the right middle cerebral artery. Both vertebral arteries patent to the basilar. PICA patent bilaterally. Decreased signal distal basilar appears to be artifact from motion. Fetal origin of the posterior cerebral artery bilaterally. Hypoplastic P1 segment bilaterally. Superior cerebellar artery patent bilaterally. MRA NECK FINDINGS Normal aortic arch. Standard three-vessel branching of the aortic arch. Mild stenosis proximal right common carotid artery which could be due to atherosclerotic disease or possibly artifact. Greater than 60% diameter stenosis proximal right internal carotid artery which is then patent without additional stenosis. Critical stenosis at the origin of the left internal carotid artery. Faint flow related signal in the left cervical internal carotid artery Both vertebral arteries patent to the basilar. Decreased signal at the origin bilaterally could be due to moderate stenosis or artifact from motion IMPRESSION: 1. Acute infarct right frontal parietal lobe in a watershed territory distribution. No hemorrhage. 2. Greater than 60% diameter stenosis proximal right internal carotid artery. 3. Critical stenosis proximal left internal carotid artery with trickle flow to the skull base. 4. Mild stenosis A1 segment bilaterally. Moderate stenosis posterior division of the right middle cerebral artery. 5. Decreased signal at the origin of the vertebral artery which could be due to stenosis versus artifact. 6. These results were called by telephone at the time of interpretation on 01/20/2018 at 12:45 pm to Dr. Cherlynn Kaiser , who verbally acknowledged these results. Electronically Signed   By: Marlan Palau M.D.   On: 01/20/2018 12:46   US Carotid Bilateral (at [facility] And [facility] Only)  Result Date: 01/20/2018 CLINICAL DATA:  TIA. Hypertension, syncope, visual  disturbance, hyperlipidemia, previous tobacco abuse. EXAM: BILATERAL CAROTID DUPLEX ULTRASOUND TECHNIQUE: Wallace Cullens scale imaging, color Doppler and duplex ultrasound were performed of bilateral carotid and vertebral arteries in the neck. COMPARISON:  None. FINDINGS: Criteria: Quantification of carotid stenosis is based on velocity parameters that correlate the residual internal carotid diameter with NASCET-based stenosis levels, using the diameter of  the distal internal carotid lumen as the denominator for stenosis measurement. The following velocity measurements were obtained: RIGHT ICA: 546/227 cm/sec CCA: 48/12 cm/sec SYSTOLIC ICA/CCA RATIO:  11.4 ECA: 84 cm/sec LEFT ICA: 36/18 cm/sec CCA: 55/11 cm/sec SYSTOLIC ICA/CCA RATIO:  0.7 ECA: 182 cm/sec RIGHT CAROTID ARTERY: Scattered partially calcified plaque through the common carotid artery. Circumferential somewhat irregular and partially calcified plaque in the bulb extending into proximal internal and external carotid arteries. High-grade stenosis in the proximal ICA with markedly elevated peak systolic velocities, spectral broadening, in focal aliasing on color Doppler. Elsewhere, normal waveforms and color Doppler signal. RIGHT VERTEBRAL ARTERY:  Normal flow direction and waveform. LEFT CAROTID ARTERY: Mild smooth plaque in the common carotid artery. Eccentric partially calcified plaque in the bulb and bifurcation. The lumen of the ICAs poorly visualized with no normal waveform. Possible string sign noted on power Doppler in the distal ICA. LEFT VERTEBRAL ARTERY:  Normal flow direction and waveform. IMPRESSION: 1. Right carotid bifurcation plaque resulting in near occlusive proximal ICA stenosis. Recommend vascular surgical or neurointerventional radiology consultation. 2. 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 See above recommendation. 3.  Antegrade bilateral vertebral arterial flow.  Electronically Signed   By: Corlis Leak M.D.   On: 01/20/2018 10:58   Dg Chest Port 1 View  Result Date: 01/19/2018 CLINICAL DATA:  Headache, hypertensive. EXAM: PORTABLE CHEST 1 VIEW COMPARISON:  Chest radiograph Jun 28, 2017 FINDINGS: Cardiomediastinal silhouette is normal. No pleural effusions or focal consolidations. Mild chronic bronchitic changes. Trachea projects midline and there is no pneumothorax. Soft tissue planes and included osseous structures are non-suspicious. Old RIGHT rib fractures. IMPRESSION: Mild chronic bronchitic changes. Electronically Signed   By: Awilda Metro M.D.   On: 01/19/2018 23:49   Mr Maxine Glenn Head Wo Contrast  Result Date: 01/20/2018 CLINICAL DATA:  TIA.  Hypertension and headache. EXAM: MR HEAD WITHOUT CONTRAST MR CIRCLE OF WILLIS WITHOUT CONTRAST MRA OF THE NECK WITHOUT AND WITH CONTRAST TECHNIQUE: Multiplanar, multiecho pulse sequences of the brain, circle of willis and surrounding structures were obtained without intravenous contrast. Angiographic images of the neck were obtained using MRA technique without and with intravenous contrast. 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 This extends posteriorly into the occipital lobe. No acute infarct in the left hemisphere. Negative for hemorrhage or mass. Ventricle size normal.  No midline shift. Vascular: Abnormal left cavernous carotid which is very small likely due to slow flow. Right cavernous carotid normal flow void. Remainder circle-of-Willis normal flow void. Skull and upper cervical spine: Negative Sinuses/Orbits: Retention cyst right maxillary sinus.  Normal orbit. Other: None MR CIRCLE OF WILLIS FINDINGS Minimal flow related signal in the left cervical carotid and left cavernous carotid due to severe proximal stenosis. Left cavernous carotid not well evaluated due to slow  flow. Right cavernous carotid normal. Anterior and middle cerebral arteries patent bilaterally. Mild stenosis A1 segment bilaterally. Moderate stenosis in the posterior division of the right middle cerebral artery. Both vertebral arteries patent to the basilar. PICA patent bilaterally. Decreased signal distal basilar appears to be artifact from motion. Fetal origin of the posterior cerebral artery bilaterally. Hypoplastic P1 segment bilaterally. Superior cerebellar artery patent bilaterally. MRA NECK FINDINGS Normal aortic arch. Standard three-vessel branching of the aortic arch. Mild stenosis proximal right common carotid artery which  could be due to atherosclerotic disease or possibly artifact. Greater than 60% diameter stenosis proximal right internal carotid artery which is then patent without additional stenosis. Critical stenosis at the origin of the left internal carotid artery. Faint flow related signal in the left cervical internal carotid artery Both vertebral arteries patent to the basilar. Decreased signal at the origin bilaterally could be due to moderate stenosis or artifact from motion IMPRESSION: 1. Acute infarct right frontal parietal lobe in a watershed territory distribution. No hemorrhage. 2. Greater than 60% diameter stenosis proximal right internal carotid artery. 3. Critical stenosis proximal left internal carotid artery with trickle flow to the skull base. 4. Mild stenosis A1 segment bilaterally. Moderate stenosis posterior division of the right middle cerebral artery. 5. Decreased signal at the origin of the vertebral artery which could be due to stenosis versus artifact. 6. These results were called by telephone at the time of interpretation on 01/20/2018 at 12:45 pm to Dr. Cherlynn KaiserSainani , who verbally acknowledged these results. Electronically Signed   By: Marlan Palauharles  Clark M.D.   On: 01/20/2018 12:46     ASSESSMENT AND PLAN:   52 year old male with past medical history of hyperlipidemia,  tobacco abuse, essential hypertension but noncompliant with meds who presented to the hospital due to headache, altered mental status and accelerated hypertension.  1.  Acute CVA-this is the cause of patient's headache/accelerated hypertension. - Patient's MRI is positive for acute infarct of the right frontal parietal lobe and watershed distribution. - Continue aspirin, high-dose intensity statin. - Await neurology input, await PT, OT and speech evaluation.  Await echocardiogram results.  Patient's MRA of the neck is positive for high-grade carotid artery stenosis and vascular surgery has been notified.  2.  Carotid artery stenosis-patient's carotid ultrasound and MRA of the neck is positive for critical left internal carotid artery stenosis with 60% stenosis of the right internal carotid artery.  Patient does present with an acute stroke. - Vascular surgery has been consulted.  Await further input from them.  Continue aspirin, statin for now.  3.  Accelerated hypertension- patient has a history of hypertension but is noncompliant with meds.  Tolerates some element of high blood pressure given his acute CVA. -Patient has been started on some lisinopril, Norvasc and will also continue PRN IV hydralazine.  4.  Hyperlipidemia-patient's total cholesterol is over 200 -Continue high-dose intensity statin with atorvastatin.  5.  Tobacco abuse- patient was placed on a nicotine patch, smoking cessation was advised.  Time spent 5 to 10 minutes.  6. Depression - cont. Abilify.   > 50% of time spent in coordinating care with Vascular surgery, patient and also Neurology.    All the records are reviewed and case discussed with Care Management/Social Worker. Management plans discussed with the patient, family and they are in agreement.  CODE STATUS: DNR  DVT Prophylaxis: Lovenox  TOTAL TIME TAKING CARE OF THIS PATIENT: 40 minutes.   POSSIBLE D/C IN 2-3 DAYS, DEPENDING ON CLINICAL  CONDITION.   Houston SirenSAINANI,Joram Venson J M.D on 01/20/2018 at 2:27 PM  Between 7am to 6pm - Pager - [phone]  After 6pm go to [email or web address] - Social research officer, [email or web address] [email or web address]  [facility]  Office  [phone]  CC: Primary care physician; Patient, No Pcp Per

## 2018-01-20 NOTE — Progress Notes (Signed)
OT Cancellation Note  Patient Details Name: Cain SieveKenneth Mattix MRN: [id number] DOB: 07/30/1965   Cancelled Treatment:    Reason Eval/Treat Not Completed: Patient at procedure or test/ unavailable(Pt. is currently out of his room for testing. Will continue to monitor, and eval when pt. is available.)  Olegario MessierElaine Makyna Niehoff, MS, OTR/L 01/20/2018, 11:10 AM

## 2018-01-20 NOTE — Consult Note (Signed)
Referring Physician: Joaquim Lai    Chief Complaint: Headache and confusion  HPI: Timothy Pham is an 52 y.o. male with past medical history of bipolar disorder, anxiety,  GERD, hyperlipidemia, hypertension, polysubstance abuse, and tobacco abuse presenting to the ED with complaints of elevated blood pressure, headache and confusion. Patient is not a good historian but state onset of symptoms since 01/17/2018 and has progressively increased in frequency, intensity and severity. The headache is intermittent, moderate to severe in intensity, located predominantly "everywhere in my head". He describes pain as throbbing, pressure and dull aching. Headaches partially responds to over the counter analgesics. Describes associated symptoms of vision blurriness and lower extremity weakness and unsteadiness. Denies associated speech abnormality, cranial nerve deficit, seizures, focal motor or sensory deficits, diplopia, or vomiting, syncope or LOC, paresthesia (numbness, tingling, pins-and-needles sensation) or a heavy feeling in an extremity. Patient state that he went to work yesterday but his co-worker noticed that he was confused and having trouble performing his usual task at work. His employer thought he was drunk at first and requested a urine drug screen. When symptoms did not improved he was sent to the ED for further evaluation. On arrival to the ED he was noted to have elevated blood pressure. He admits that he has not been taking his blood pressure medication.  Initial CTA head and negative for acute intracranial abnormality. 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 Patient was therefore admitted for further stroke work up and management.  Date last known well: Unable to determine Time last known well: Unable to determine tPA Given: No: Unable to determine LKW  Past Medical History:  Diagnosis Date  . Anxiety   . Bipolar 1  disorder (HCC)   . GERD (gastroesophageal reflux disease)   . Hyperlipidemia   . Hypertension   . Nephrolithiasis    History reviewed. No pertinent surgical history.  Family History  Problem Relation Age of Onset  . Heart disease Mother   . Bipolar disorder Mother   . Heart disease Father   . Diabetes Mellitus II Father   . Obesity Sister   . Heart disease Brother    Social History:  reports that he has been smoking cigarettes. He has been smoking about 1.00 pack per day. He has never used smokeless tobacco. He reports that he does not drink alcohol or use drugs.  Allergies:  Allergies  Allergen Reactions  . Wellbutrin [Bupropion] Itching    Medications:  I have reviewed the patient's current medications. Prior to Admission:  Medications Prior to Admission  Medication Sig Dispense Refill Last Dose  . ARIPiprazole (ABILIFY) 10 MG tablet Take 10 mg by mouth daily. Take 0.5 tablet for 7 day, then increase to 1 tablet for 3 weeks. Pt is now on 1 tablet last fill on 11-06-17  0 11/06/2017 at unknown  . loratadine (CLARITIN) 10 MG tablet Take 10 mg by mouth daily.  0 01/19/2018 at Unknown time  . lovastatin (MEVACOR) 10 MG tablet Take 10 mg by mouth daily.   0 01/19/2018 at Unknown time  . omeprazole (PRILOSEC) 20 MG capsule Take 20 mg by mouth 2 (two) times daily.  0 01/19/2018 at Unknown time  . lamoTRIgine (LAMICTAL) 150 MG tablet Take 150 mg by mouth daily.   Not Taking at unknown   Scheduled: .  stroke: mapping our early stages of recovery book   Does not apply Once  . amLODipine  5 mg Oral Daily  . [START ON 01/21/2018] ARIPiprazole  10 mg Oral Daily  . aspirin  300 mg Rectal Daily   Or  . aspirin  325 mg Oral Daily  . atorvastatin  40 mg Oral q1800  . docusate sodium  100 mg Oral BID  . enoxaparin (LOVENOX) injection  40 mg Subcutaneous Q24H  . [START ON 01/21/2018] lisinopril  10 mg Oral Daily  . loratadine  10 mg Oral Daily  . nicotine  21 mg Transdermal Once  .  pantoprazole  40 mg Oral Daily    ROS: History obtained from the patient   General ROS: negative for - chills, fatigue, fever, night sweats, weight gain or weight loss Psychological ROS: negative for - behavioral disorder, hallucinations, memory difficulties, mood swings or suicidal ideation Ophthalmic ROS: negative for - blurry vision, double vision, eye pain or loss of vision ENT ROS: negative for - epistaxis, nasal discharge, oral lesions, sore throat, tinnitus or vertigo Allergy and Immunology ROS: negative for - hives or itchy/watery eyes Hematological and Lymphatic ROS: negative for - bleeding problems, bruising or swollen lymph nodes Endocrine ROS: negative for - galactorrhea, hair pattern changes, polydipsia/polyuria or temperature intolerance Respiratory ROS: negative for - cough, hemoptysis, shortness of breath or wheezing Cardiovascular ROS: negative for - chest pain, dyspnea on exertion, edema or irregular heartbeat Gastrointestinal ROS: negative for - abdominal pain, diarrhea, hematemesis, nausea/vomiting or stool incontinence Genito-Urinary ROS: negative for - dysuria, hematuria, incontinence or urinary frequency/urgency Musculoskeletal ROS: negative for - joint swelling or muscular weakness Neurological ROS: as noted in HPI Dermatological ROS: negative for rash and skin lesion changes  Physical Examination: Blood pressure (!) 161/74, pulse 69, temperature 97.8 F (36.6 C), temperature source Oral, resp. rate 20, height 5\' 5"  (1.651 m), weight 78.2 kg, SpO2 96 %.   HEENT-  Normocephalic, no lesions, without obvious abnormality.  Normal external eye and conjunctiva.  Normal TM's bilaterally.  Normal auditory canals and external ears. Normal external nose, mucus membranes and septum.  Normal pharynx. Cardiovascular- S1, S2 normal, pulses palpable throughout   Lungs- chest clear, no wheezing, rales, normal symmetric air entry Abdomen- soft, non-tender; bowel sounds normal; no  masses,  no organomegaly Extremities- no edema Lymph-no adenopathy palpable Musculoskeletal-no joint tenderness, deformity or swelling Skin-warm and dry, no hyperpigmentation, vitiligo, or suspicious lesions  Neurological Exam   Mental Status: Alert, oriented, appears agitated and irritable.  Speech fluent without evidence of aphasia.  Able to follow 3 step commands without difficulty. Attention span and concentration seemed appropriate  Cranial Nerves: II: Discs flat bilaterally; Visual fields grossly normal, pupils equal, round, reactive to light and accommodation III,IV, VI: ptosis not present, extra-ocular motions intact bilaterally V,VII: smile symmetric, facial light touch sensation intact VIII: hearing normal bilaterally IX,X: gag reflex present XI: bilateral shoulder shrug XII: midline tongue extension Motor: Right :  Upper extremity   5/5 Without pronator drift      Left: Upper extremity   5/5 without pronator drift Right:   Lower extremity   5/5                                          Left: Lower extremity   5/5 Tone and bulk:normal tone throughout; no atrophy noted Sensory: Pinprick and light touch intact bilaterally Deep Tendon Reflexes: 2+ and symmetric throughout Plantars: Right: downgoing  Left: downgoing Cerebellar: Finger-to-nose testing intact bilaterally. Heel to shin testing normal bilaterally Gait: not tested due to safety concerns  Data Reviewed  Laboratory Studies:  Basic Metabolic Panel: Recent Labs  Lab 01/19/18 2347  NA 143  K 3.9  CL 106  CO2 24  GLUCOSE 107*  BUN 15  CREATININE 0.88  CALCIUM 9.7    Liver Function Tests: No results for input(s): AST, ALT, ALKPHOS, BILITOT, PROT, ALBUMIN in the last 168 hours. No results for input(s): LIPASE, AMYLASE in the last 168 hours. No results for input(s): AMMONIA in the last 168 hours.  CBC: Recent Labs  Lab 01/19/18 2347  WBC 12.8*  HGB 15.3  HCT 45.2  MCV 93.6   PLT 299    Cardiac Enzymes: Recent Labs  Lab 01/19/18 2347  TROPONINI <0.03    BNP: Invalid input(s): POCBNP  CBG: No results for input(s): GLUCAP in the last 168 hours.  Microbiology: No results found for this or any previous visit.  Coagulation Studies: No results for input(s): LABPROT, INR in the last 72 hours.  Urinalysis: No results for input(s): COLORURINE, LABSPEC, PHURINE, GLUCOSEU, HGBUR, BILIRUBINUR, KETONESUR, PROTEINUR, UROBILINOGEN, NITRITE, LEUKOCYTESUR in the last 168 hours.  Invalid input(s): APPERANCEUR  Lipid Panel:    Component Value Date/Time   CHOL 266 (H) 01/19/2018 2347   TRIG 222 (H) 01/19/2018 2347   HDL 53 01/19/2018 2347   CHOLHDL 5.0 01/19/2018 2347   VLDL 44 (H) 01/19/2018 2347   LDLCALC 169 (H) 01/19/2018 2347    HgbA1C:  Lab Results  Component Value Date   HGBA1C 5.9 (H) 01/19/2018    Urine Drug Screen:      Component Value Date/Time   LABOPIA NONE DETECTED 01/20/2018 0006   COCAINSCRNUR NONE DETECTED 01/20/2018 0006   LABBENZ NONE DETECTED 01/20/2018 0006   AMPHETMU NONE DETECTED 01/20/2018 0006   THCU POSITIVE (A) 01/20/2018 0006   LABBARB NONE DETECTED 01/20/2018 0006    Alcohol Level:  Recent Labs  Lab 01/20/18 0006  ETH <10    Other results: EKG: normal EKG, normal sinus rhythm, unchanged from previous tracings. Vent. rate 63 BPM PR interval * ms QRS duration 92 ms QT/QTc 397/407 ms P-R-T axes -28 24 2   Imaging: Ct Angio Head W Or Wo Contrast  Result Date: 01/20/2018 CLINICAL DATA:  Headache and difficulty concentrating for 2 days. Hypertensive. EXAM: CT ANGIOGRAPHY HEAD TECHNIQUE: Multidetector CT imaging of the head was performed using the standard protocol during bolus administration of intravenous contrast. Multiplanar CT image reconstructions and MIPs were obtained to evaluate the vascular anatomy. CONTRAST:  75mL OMNIPAQUE IOHEXOL 350 MG/ML SOLN COMPARISON:  None. FINDINGS: CT HEAD BRAIN: No  intraparenchymal hemorrhage, mass effect nor midline shift. Linear RIGHT frontal and RIGHT parietal encephalomalacia. Smaller RIGHT inferior frontal lobe encephalomalacia. The ventricles and sulci are normal. No acute large vascular territory infarcts. No abnormal extra-axial fluid collections. Basal cisterns are patent. VASCULAR: Unremarkable. SKULL/SOFT TISSUES: No skull fracture. No significant soft tissue swelling. ORBITS/SINUSES: The included ocular globes and orbital contents are normal.Trace paranasal sinus mucosal thickening. Mastoid air cells are well aerated. OTHER: None. CTA HEAD ANTERIOR CIRCULATION: Diminutive LEFT petrous ICA, tine versus occluded in the cervical portion. RIGHT ICA is dominant. No large vessel occlusion, significant stenosis, contrast extravasation or aneurysm. POSTERIOR CIRCULATION: Patent vertebral arteries, vertebrobasilar junction and basilar artery, as well as main branch vessels. Patent posterior cerebral arteries. Robust LEFT posterior communicating artery present. Fetal origin LEFT PCA. No large vessel occlusion, significant stenosis, contrast  extravasation or aneurysm. VENOUS SINUSES: Major dural venous sinuses are patent though not tailored for evaluation on this angiographic examination. ANATOMIC VARIANTS: None. DELAYED PHASE: Not performed. MIP images reviewed. IMPRESSION: CT HEAD: 1. No acute intracranial process. 2. 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 Findings may be better demonstrated on non emergent MRI head. CTA HEAD: 1. No emergent large vessel occlusion or flow-limiting stenosis. 2. Asymmetrically smaller LEFT ICA, potentially occluded in the neck. Recommend CTA versus MRA neck on non emergent basis. Electronically Signed   By: Awilda Metro M.D.   On: 01/20/2018 02:15   Mr Maxine Glenn Neck W Wo Contrast  Result Date: 01/20/2018 CLINICAL DATA:  TIA.  Hypertension and headache. EXAM: MR HEAD WITHOUT CONTRAST MR CIRCLE OF  WILLIS WITHOUT CONTRAST MRA OF THE NECK WITHOUT AND WITH CONTRAST TECHNIQUE: Multiplanar, multiecho pulse sequences of the brain, circle of willis and surrounding structures were obtained without intravenous contrast. Angiographic images of the neck were obtained using MRA technique without and with intravenous contrast. 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 This extends posteriorly into the occipital lobe. No acute infarct in the left hemisphere. Negative for hemorrhage or mass. Ventricle size normal.  No midline shift. Vascular: Abnormal left cavernous carotid which is very small likely due to slow flow. Right cavernous carotid normal flow void. Remainder circle-of-Willis normal flow void. Skull and upper cervical spine: Negative Sinuses/Orbits: Retention cyst right maxillary sinus.  Normal orbit. Other: None MR CIRCLE OF WILLIS FINDINGS Minimal flow related signal in the left cervical carotid and left cavernous carotid due to severe proximal stenosis. Left cavernous carotid not well evaluated due to slow flow. Right cavernous carotid normal. Anterior and middle cerebral arteries patent bilaterally. Mild stenosis A1 segment bilaterally. Moderate stenosis in the posterior division of the right middle cerebral artery. Both vertebral arteries patent to the basilar. PICA patent bilaterally. Decreased signal distal basilar appears to be artifact from motion. Fetal origin of the posterior cerebral artery bilaterally. Hypoplastic P1 segment bilaterally. Superior cerebellar artery patent bilaterally. MRA NECK FINDINGS Normal aortic arch. Standard three-vessel branching of the aortic arch. Mild stenosis proximal right common carotid artery which could be due to atherosclerotic disease or possibly artifact. Greater than 60% diameter stenosis proximal right internal carotid artery  which is then patent without additional stenosis. Critical stenosis at the origin of the left internal carotid artery. Faint flow related signal in the left cervical internal carotid artery Both vertebral arteries patent to the basilar. Decreased signal at the origin bilaterally could be due to moderate stenosis or artifact from motion IMPRESSION: 1. Acute infarct right frontal parietal lobe in a watershed territory distribution. No hemorrhage. 2. Greater than 60% diameter stenosis proximal right internal carotid artery. 3. Critical stenosis proximal left internal carotid artery with trickle flow to the skull base. 4. Mild stenosis A1 segment bilaterally. Moderate stenosis posterior division of the right middle cerebral artery. 5. Decreased signal at the origin of the vertebral artery which could be due to stenosis versus artifact. 6. These results were called by telephone at the time of interpretation on 01/20/2018 at 12:45 pm to Dr. Cherlynn Kaiser , who verbally acknowledged these results. Electronically Signed   By: Marlan Palau M.D.   On: 01/20/2018 12:46   Mr Brain Wo Contrast  Result Date: 01/20/2018 CLINICAL DATA:  TIA.  Hypertension and headache. EXAM:  MR HEAD WITHOUT CONTRAST MR CIRCLE OF WILLIS WITHOUT CONTRAST MRA OF THE NECK WITHOUT AND WITH CONTRAST TECHNIQUE: Multiplanar, multiecho pulse sequences of the brain, circle of willis and surrounding structures were obtained without intravenous contrast. Angiographic images of the neck were obtained using MRA technique without and with intravenous contrast. 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 This extends posteriorly into the occipital lobe. No acute infarct in the left hemisphere. Negative for hemorrhage or mass. Ventricle size normal.  No midline shift. Vascular: Abnormal left cavernous carotid which is very small  likely due to slow flow. Right cavernous carotid normal flow void. Remainder circle-of-Willis normal flow void. Skull and upper cervical spine: Negative Sinuses/Orbits: Retention cyst right maxillary sinus.  Normal orbit. Other: None MR CIRCLE OF WILLIS FINDINGS Minimal flow related signal in the left cervical carotid and left cavernous carotid due to severe proximal stenosis. Left cavernous carotid not well evaluated due to slow flow. Right cavernous carotid normal. Anterior and middle cerebral arteries patent bilaterally. Mild stenosis A1 segment bilaterally. Moderate stenosis in the posterior division of the right middle cerebral artery. Both vertebral arteries patent to the basilar. PICA patent bilaterally. Decreased signal distal basilar appears to be artifact from motion. Fetal origin of the posterior cerebral artery bilaterally. Hypoplastic P1 segment bilaterally. Superior cerebellar artery patent bilaterally. MRA NECK FINDINGS Normal aortic arch. Standard three-vessel branching of the aortic arch. Mild stenosis proximal right common carotid artery which could be due to atherosclerotic disease or possibly artifact. Greater than 60% diameter stenosis proximal right internal carotid artery which is then patent without additional stenosis. Critical stenosis at the origin of the left internal carotid artery. Faint flow related signal in the left cervical internal carotid artery Both vertebral arteries patent to the basilar. Decreased signal at the origin bilaterally could be due to moderate stenosis or artifact from motion IMPRESSION: 1. Acute infarct right frontal parietal lobe in a watershed territory distribution. No hemorrhage. 2. Greater than 60% diameter stenosis proximal right internal carotid artery. 3. Critical stenosis proximal left internal carotid artery with trickle flow to the skull base. 4. Mild stenosis A1 segment bilaterally. Moderate stenosis posterior division of the right middle cerebral  artery. 5. Decreased signal at the origin of the vertebral artery which could be due to stenosis versus artifact. 6. These results were called by telephone at the time of interpretation on 01/20/2018 at 12:45 pm to Dr. Cherlynn Kaiser , who verbally acknowledged these results. Electronically Signed   By: Marlan Palau M.D.   On: 01/20/2018 12:46   US Carotid Bilateral (at [facility] And [facility] Only)  Result Date: 01/20/2018 CLINICAL DATA:  TIA. Hypertension, syncope, visual disturbance, hyperlipidemia, previous tobacco abuse. EXAM: BILATERAL CAROTID DUPLEX ULTRASOUND TECHNIQUE: Wallace Cullens scale imaging, color Doppler and duplex ultrasound were performed of bilateral carotid and vertebral arteries in the neck. COMPARISON:  None. FINDINGS: Criteria: Quantification of carotid stenosis is based on velocity parameters that correlate the residual internal carotid diameter with NASCET-based stenosis levels, using the diameter of the distal internal carotid lumen as the denominator for stenosis measurement. The following velocity measurements were obtained: RIGHT ICA: 546/227 cm/sec CCA: 48/12 cm/sec SYSTOLIC ICA/CCA RATIO:  11.4 ECA: 84 cm/sec LEFT ICA: 36/18 cm/sec CCA: 55/11 cm/sec SYSTOLIC ICA/CCA RATIO:  0.7 ECA: 182 cm/sec RIGHT CAROTID ARTERY: Scattered partially calcified plaque through the common carotid artery. Circumferential somewhat irregular and  partially calcified plaque in the bulb extending into proximal internal and external carotid arteries. High-grade stenosis in the proximal ICA with markedly elevated peak systolic velocities, spectral broadening, in focal aliasing on color Doppler. Elsewhere, normal waveforms and color Doppler signal. RIGHT VERTEBRAL ARTERY:  Normal flow direction and waveform. LEFT CAROTID ARTERY: Mild smooth plaque in the common carotid artery. Eccentric partially calcified plaque in the bulb and bifurcation. The lumen of the ICAs poorly visualized with no normal waveform. Possible string sign noted on  power Doppler in the distal ICA. LEFT VERTEBRAL ARTERY:  Normal flow direction and waveform. IMPRESSION: 1. Right carotid bifurcation plaque resulting in near occlusive proximal ICA stenosis. Recommend vascular surgical or neurointerventional radiology consultation. 2. 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 See above recommendation. 3.  Antegrade bilateral vertebral arterial flow. Electronically Signed   By: Corlis Leak M.D.   On: 01/20/2018 10:58   Dg Chest Port 1 View  Result Date: 01/19/2018 CLINICAL DATA:  Headache, hypertensive. EXAM: PORTABLE CHEST 1 VIEW COMPARISON:  Chest radiograph Jun 28, 2017 FINDINGS: Cardiomediastinal silhouette is normal. No pleural effusions or focal consolidations. Mild chronic bronchitic changes. Trachea projects midline and there is no pneumothorax. Soft tissue planes and included osseous structures are non-suspicious. Old RIGHT rib fractures. IMPRESSION: Mild chronic bronchitic changes. Electronically Signed   By: Awilda Metro M.D.   On: 01/19/2018 23:49   Mr Maxine Glenn Head Wo Contrast  Result Date: 01/20/2018 CLINICAL DATA:  TIA.  Hypertension and headache. EXAM: MR HEAD WITHOUT CONTRAST MR CIRCLE OF WILLIS WITHOUT CONTRAST MRA OF THE NECK WITHOUT AND WITH CONTRAST TECHNIQUE: Multiplanar, multiecho pulse sequences of the brain, circle of willis and surrounding structures were obtained without intravenous contrast. Angiographic images of the neck were obtained using MRA technique without and with intravenous contrast. 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 This extends posteriorly into the occipital lobe. No acute infarct in the left hemisphere. Negative for hemorrhage or mass. Ventricle size normal.  No midline shift. Vascular: Abnormal left cavernous  carotid which is very small likely due to slow flow. Right cavernous carotid normal flow void. Remainder circle-of-Willis normal flow void. Skull and upper cervical spine: Negative Sinuses/Orbits: Retention cyst right maxillary sinus.  Normal orbit. Other: None MR CIRCLE OF WILLIS FINDINGS Minimal flow related signal in the left cervical carotid and left cavernous carotid due to severe proximal stenosis. Left cavernous carotid not well evaluated due to slow flow. Right cavernous carotid normal. Anterior and middle cerebral arteries patent bilaterally. Mild stenosis A1 segment bilaterally. Moderate stenosis in the posterior division of the right middle cerebral artery. Both vertebral arteries patent to the basilar. PICA patent bilaterally. Decreased signal distal basilar appears to be artifact from motion. Fetal origin of the posterior cerebral artery bilaterally. Hypoplastic P1 segment bilaterally. Superior cerebellar artery patent bilaterally. MRA NECK FINDINGS Normal aortic arch. Standard three-vessel branching of the aortic arch. Mild stenosis proximal right common carotid artery which could be due to atherosclerotic disease or possibly artifact. Greater than 60% diameter stenosis proximal right internal carotid artery which is then patent without additional stenosis. Critical stenosis at the origin of the left internal carotid artery. Faint flow related signal in the left cervical internal carotid artery Both vertebral arteries patent to the basilar. Decreased signal at the origin bilaterally could be  due to moderate stenosis or artifact from motion IMPRESSION: 1. Acute infarct right frontal parietal lobe in a watershed territory distribution. No hemorrhage. 2. Greater than 60% diameter stenosis proximal right internal carotid artery. 3. Critical stenosis proximal left internal carotid artery with trickle flow to the skull base. 4. Mild stenosis A1 segment bilaterally. Moderate stenosis posterior division of the  right middle cerebral artery. 5. Decreased signal at the origin of the vertebral artery which could be due to stenosis versus artifact. 6. These results were called by telephone at the time of interpretation on 01/20/2018 at 12:45 pm to Dr. Cherlynn Kaiser , who verbally acknowledged these results. Electronically Signed   By: Marlan Palau M.D.   On: 01/20/2018 12:46   Assessment: 52 y.o. male  with past medical history of bipolar disorder, anxiety,  GERD, hyperlipidemia, hypertension, polysubstance abuse, and tobacco abuse presenting to the ED with complaints of elevated blood pressure, headache and confusion.  Mental status improved however still appears agitated and irritable. Demanding to leave AMA.  MRI of the brain reviewed and showed acute infarct in the right frontoparietal lobe in a watershed territory distribution.  Etiology likely small vessel disease.  CTA head showed no emergent large vessel occlusion or flow-limiting stenosis.  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  Vascular following for possible intervention. Hemoglobin A1c 5.9, LDL 169.  Patient states he was not on any antiplatelet or anticoagulation prior to this event  Stroke Risk Factors - carotid stenosis, family history, hyperlipidemia, hypertension and smoking   Plan: 1. Start Aspirin 81 mg/day and Plavix 75 mg /day with intensive management of vascular risk factor 2. Allow permissive hypertension in the setting of watershed infarct with future goal to keep systolic BP (SBP) <140 mm Hg (161 mm Hg if diabetic) 3. Start high potency statin with goal low density lipoprotein (LDL) <70 mg/dl 4. PT consult, OT consult, Speech consult 5. Echocardiogram pending 6. Smoking cessation and polysubstance abuse counseling 7. NPO until RN stroke swallow screen 8. Telemetry  monitoring 9. Frequent neuro checks  This patient was staffed with Dr. Jonna Munro  who personally evaluated patient, reviewed documentation and agreed with assessment and plan of care as above.  Webb Silversmith, DNP, FNP-BC Board certified Nurse Practitioner Neurology Department  01/20/2018, 3:14 PM

## 2018-01-20 NOTE — Progress Notes (Signed)
SLP Cancellation Note  Patient Details Name: Timothy Pham MRN: [id number] DOB: 1965/12/19   Cancelled treatment:       Reason Eval/Treat Not Completed: SLP screened, no needs identified, will sign off(chart reviewed; consulted MD/NSG then met w/ pt). Pt denied any difficulty swallowing and is currently on a regular diet; tolerates swallowing pills w/ water (observed w/ NSG). Pt conversed at conversational level w/ MD and NSG w/out significant deficits noted; pt was intelligible. Pt denied any new speech-language deficits.  No further skilled ST services indicated as pt appears grossly at his baseline. Pt agreed. NSG to reconsult if any change in status.     Orinda Kenner, MS, CCC-SLP Watson,Katherine 01/20/2018, 12:15 PM

## 2018-01-20 NOTE — Consult Note (Signed)
[facility] Vascular Consult Note  MRN : [id number]  Timothy Pham is a 52 y.o. (Sep 27, 1965) male who presents with chief complaint of  Chief Complaint  Patient presents with  . Headache  . Altered Mental Status  . Hypertension   History of Present Illness:  The patient is a 52 year old male with a past medical history of anxiety, bipolar 1 disorder, GERD, hyperlipidemia, hypertension, nephrolithiasis, current everyday smoker who presented to the [facility] with a chief complaint of "headache".  The patient endorses a recent history (on Thursday) of experiencing a temporary period of not being able to "walk" and becoming very "confused".  Patient notes that this lasted a few minutes and resolved.  The patient notes that the symptoms occurred on Thursday however the patient was brought to the emergency department yesterday evening by his coworkers had noted the patient to be confused and disoriented.  CTA of the head conducted on January 20, 2018 was notable for "no acute intracranial process. 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 Asymmetrically smaller LEFT ICA, potentially occluded in the neck."  MRA of the neck and head conducted on January 20, 2018 were notable for "Acute infarct right frontal parietal lobe in a watershed territory distribution. No hemorrhage. Greater than 60% diameter stenosis proximal right internal carotid artery. Critical stenosis proximal left internal carotid artery with trickle flow to the skull base. Mild stenosis A1 segment bilaterally. Moderate stenosis posterior division of the right middle cerebral artery. Decreased signal at the origin of the vertebral artery which could be due to stenosis versus artifact."  Bilateral carotid artery duplex conducted on February 16, 2018 was notable for "Right carotid bifurcation  plaque resulting in near occlusive proximal ICA stenosis. Recommend vascular surgical or neurointerventional radiology consultation. 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 See above Recommendation. Antegrade bilateral vertebral arterial flow."  The patient denies any past medical history of amaurosis fujax or neurological deficits prior to his most recent episode which brought him to our emergency department.  Patient denies any residual neurological issues since his recent stroke.  Vascular surgery was consulted by Dr. Cherlynn Kaiser for further recommendations.  Current Facility-Administered Medications  Medication Dose Route Frequency Provider Last Rate Last Dose  .  stroke: mapping our early stages of recovery book   Does not apply Once Arnaldo Natal, MD      . acetaminophen (TYLENOL) tablet 650 mg  650 mg Oral Q6H PRN Arnaldo Natal, MD       Or  . acetaminophen (TYLENOL) suppository 650 mg  650 mg Rectal Q6H PRN Arnaldo Natal, MD      . amLODipine (NORVASC) tablet 5 mg  5 mg Oral Daily Houston Siren, MD   5 mg at 01/20/18 1312  . [START ON 01/21/2018] ARIPiprazole (ABILIFY) tablet 10 mg  10 mg Oral Daily Sainani, Rolly Pancake, MD      . aspirin suppository 300 mg  300 mg Rectal Daily Arnaldo Natal, MD       Or  . aspirin tablet 325 mg  325 mg Oral Daily Arnaldo Natal, MD   325 mg at 01/20/18 1312  . atorvastatin (LIPITOR) tablet 40 mg  40 mg Oral q1800 Houston Siren, MD      . docusate sodium (COLACE) capsule 100 mg  100 mg Oral BID Joycelyn Rua  S, MD   100 mg at 01/20/18 1146  . enoxaparin (LOVENOX) injection 40 mg  40 mg Subcutaneous Q24H Arnaldo Nataliamond, Michael S, MD   40 mg at 01/20/18 0757  . hydrALAZINE (APRESOLINE) injection 10 mg  10 mg Intravenous Q6H PRN Houston SirenSainani, Vivek J, MD      . Melene Muller[START ON 01/21/2018] lisinopril (PRINIVIL,ZESTRIL) tablet 10 mg  10 mg Oral Daily Houston SirenSainani, Vivek J, MD       . loratadine (CLARITIN) tablet 10 mg  10 mg Oral Daily Arnaldo Nataliamond, Michael S, MD   10 mg at 01/20/18 1146  . nicotine (NICODERM CQ - dosed in mg/24 hours) patch 21 mg  21 mg Transdermal Once Darci CurrentBrown, [place] N, MD   21 mg at 01/20/18 0351  . ondansetron (ZOFRAN) tablet 4 mg  4 mg Oral Q6H PRN Arnaldo Nataliamond, Michael S, MD       Or  . ondansetron [facility](ZOFRAN) injection 4 mg  4 mg Intravenous Q6H PRN Arnaldo Nataliamond, Michael S, MD      . pantoprazole (PROTONIX) EC tablet 40 mg  40 mg Oral Daily Arnaldo Nataliamond, Michael S, MD   40 mg at 01/20/18 1145   Past Medical History:  Diagnosis Date  . Anxiety   . Bipolar 1 disorder (HCC)   . GERD (gastroesophageal reflux disease)   . Hyperlipidemia   . Hypertension   . Nephrolithiasis    History reviewed. No pertinent surgical history.  Social History Social History   Tobacco Use  . Smoking status: Current Every Day Smoker    Packs/day: 1.00    Types: Cigarettes  . Smokeless tobacco: Never Used  Substance Use Topics  . Alcohol use: No  . Drug use: No   Family History Family History  Problem Relation Age of Onset  . Heart disease Mother   . Bipolar disorder Mother   . Heart disease Father   . Diabetes Mellitus II Father   . Obesity Sister   . Heart disease Brother   Denies any family history of peripheral artery disease, venous disease or bleeding/clotting disorders  Allergies  Allergen Reactions  . Wellbutrin [Bupropion] Itching   REVIEW OF SYSTEMS (Negative unless checked)  Constitutional: [] Weight loss  [] Fever  [] Chills Cardiac: [] Chest pain   [] Chest pressure   [] Palpitations   [] Shortness of breath when laying flat   [] Shortness of breath at rest   [] Shortness of breath with exertion. Vascular:  [] Pain in legs with walking   [] Pain in legs at rest   [] Pain in legs when laying flat   [] Claudication   [] Pain in feet when walking  [] Pain in feet at rest  [] Pain in feet when laying flat   [] History of DVT   [] Phlebitis   [] Swelling in legs   [] Varicose  veins   [] Non-healing ulcers Pulmonary:   [] Uses home oxygen   [] Productive cough   [] Hemoptysis   [] Wheeze  [] COPD   [] Asthma Neurologic:  [] Dizziness  [] Blackouts   [] Seizures   [x] History of stroke   [x] History of TIA  [] Aphasia   [] Temporary blindness   [] Dysphagia   [] Weakness or numbness in arms   [] Weakness or numbness in legs Musculoskeletal:  [] Arthritis   [] Joint swelling   [] Joint pain   [] Low back pain Hematologic:  [] Easy bruising  [] Easy bleeding   [] Hypercoagulable state   [] Anemic  [] Hepatitis Gastrointestinal:  [] Blood in stool   [] Vomiting blood  [x] Gastroesophageal reflux/heartburn   [] Difficulty swallowing. Genitourinary:  [] Chronic kidney disease   [] Difficult urination  [] Frequent urination  []   Burning with urination   [] Blood in urine Skin:  [] Rashes   [] Ulcers   [] Wounds Psychological:  [] History of anxiety   []  History of major depression.  Physical Examination  Vitals:   01/20/18 0330 01/20/18 0554 01/20/18 0644 01/20/18 0800  BP: (!) 170/90 (!) 150/79  (!) 161/74  Pulse: 64 (!) 54  69  Resp: (!) 21 20    Temp:  98.4 F (36.9 C)  97.8 F (36.6 C)  TempSrc:  Oral  Oral  SpO2: 95% 95%  96%  Weight:   78.2 kg   Height:       Body mass index is 28.71 kg/m. Gen:  WD/WN, NAD Head: [place]/AT, No temporalis wasting. Prominent temp pulse not noted. Ear/Nose/Throat: Hearing grossly intact, nares w/o erythema or drainage, oropharynx w/o Erythema/Exudate Eyes: Sclera non-icteric, conjunctiva clear Neck: Trachea midline.  No JVD. No carotid bruits noted on exam. Pulmonary:  Good air movement, respirations not labored, equal bilaterally.  Cardiac: RRR, normal S1, S2. Vascular: * Vessel Right Left  Radial Palpable Palpable  Ulnar Palpable Palpable  Brachial Palpable Palpable  Carotid Palpable, without bruit Palpable, without bruit                       Gastrointestinal: soft, non-tender/non-distended. No guarding/reflex.  Musculoskeletal: M/S 5/5 throughout.   Extremities without ischemic changes.  No deformity or atrophy. No edema. Neurologic: Sensation grossly intact in extremities.  Symmetrical.  Speech is fluent. Motor exam as listed above. Psychiatric: Judgment intact, Mood & affect appropriate for pt's clinical situation. Dermatologic: No rashes or ulcers noted.  No cellulitis or open wounds. Lymph : No Cervical, Axillary, or Inguinal lymphadenopathy.  CBC Lab Results  Component Value Date   WBC 12.8 (H) 01/19/2018   HGB 15.3 01/19/2018   HCT 45.2 01/19/2018   MCV 93.6 01/19/2018   PLT 299 01/19/2018   BMET    Component Value Date/Time   NA 143 01/19/2018 2347   K 3.9 01/19/2018 2347   CL 106 01/19/2018 2347   CO2 24 01/19/2018 2347   GLUCOSE 107 (H) 01/19/2018 2347   BUN 15 01/19/2018 2347   CREATININE 0.88 01/19/2018 2347   CALCIUM 9.7 01/19/2018 2347   GFRNONAA >60 01/19/2018 2347   GFRAA >60 01/19/2018 2347   Estimated Creatinine Clearance: 93.6 mL/min (by C-G formula based on SCr of 0.88 mg/dL).  COAG No results found for: INR, PROTIME  Radiology Ct Angio Head W Or Wo Contrast  Result Date: 01/20/2018 CLINICAL DATA:  Headache and difficulty concentrating for 2 days. Hypertensive. EXAM: CT ANGIOGRAPHY HEAD TECHNIQUE: Multidetector CT imaging of the head was performed using the standard protocol during bolus administration of intravenous contrast. Multiplanar CT image reconstructions and MIPs were obtained to evaluate the vascular anatomy. CONTRAST:  75mL OMNIPAQUE IOHEXOL 350 MG/ML SOLN COMPARISON:  None. FINDINGS: CT HEAD BRAIN: No intraparenchymal hemorrhage, mass effect nor midline shift. Linear RIGHT frontal and RIGHT parietal encephalomalacia. Smaller RIGHT inferior frontal lobe encephalomalacia. The ventricles and sulci are normal. No acute large vascular territory infarcts. No abnormal extra-axial fluid collections. Basal cisterns are patent. VASCULAR: Unremarkable. SKULL/SOFT TISSUES: No skull fracture. No significant  soft tissue swelling. ORBITS/SINUSES: The included ocular globes and orbital contents are normal.Trace paranasal sinus mucosal thickening. Mastoid air cells are well aerated. OTHER: None. CTA HEAD ANTERIOR CIRCULATION: Diminutive LEFT petrous ICA, tine versus occluded in the cervical portion. RIGHT ICA is dominant. No large vessel occlusion, significant stenosis, contrast extravasation or aneurysm. POSTERIOR CIRCULATION:  Patent vertebral arteries, vertebrobasilar junction and basilar artery, as well as main branch vessels. Patent posterior cerebral arteries. Robust LEFT posterior communicating artery present. Fetal origin LEFT PCA. No large vessel occlusion, significant stenosis, contrast extravasation or aneurysm. VENOUS SINUSES: Major dural venous sinuses are patent though not tailored for evaluation on this angiographic examination. ANATOMIC VARIANTS: None. DELAYED PHASE: Not performed. MIP images reviewed. IMPRESSION: CT HEAD: 1. No acute intracranial process. 2. 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 Findings may be better demonstrated on non emergent MRI head. CTA HEAD: 1. No emergent large vessel occlusion or flow-limiting stenosis. 2. Asymmetrically smaller LEFT ICA, potentially occluded in the neck. Recommend CTA versus MRA neck on non emergent basis. Electronically Signed   By: Awilda Metro M.D.   On: 01/20/2018 02:15   Mr Maxine Glenn Neck W Wo Contrast  Result Date: 01/20/2018 CLINICAL DATA:  TIA.  Hypertension and headache. EXAM: MR HEAD WITHOUT CONTRAST MR CIRCLE OF WILLIS WITHOUT CONTRAST MRA OF THE NECK WITHOUT AND WITH CONTRAST TECHNIQUE: Multiplanar, multiecho pulse sequences of the brain, circle of willis and surrounding structures were obtained without intravenous contrast. Angiographic images of the neck were obtained using MRA technique without and with intravenous contrast. CONTRAST:  7.5 mL Gadovist IV COMPARISON:  CTA head and neck 01/20/2018  FINDINGS: MR HEAD FINDINGS Brain: Acute infarct in the right frontal parietal lobe in a watershed distribution. This extends posteriorly into the occipital lobe. No acute infarct in the left hemisphere. Negative for hemorrhage or mass. Ventricle size normal.  No midline shift. Vascular: Abnormal left cavernous carotid which is very small likely due to slow flow. Right cavernous carotid normal flow void. Remainder circle-of-Willis normal flow void. Skull and upper cervical spine: Negative Sinuses/Orbits: Retention cyst right maxillary sinus.  Normal orbit. Other: None MR CIRCLE OF WILLIS FINDINGS Minimal flow related signal in the left cervical carotid and left cavernous carotid due to severe proximal stenosis. Left cavernous carotid not well evaluated due to slow flow. Right cavernous carotid normal. Anterior and middle cerebral arteries patent bilaterally. Mild stenosis A1 segment bilaterally. Moderate stenosis in the posterior division of the right middle cerebral artery. Both vertebral arteries patent to the basilar. PICA patent bilaterally. Decreased signal distal basilar appears to be artifact from motion. Fetal origin of the posterior cerebral artery bilaterally. Hypoplastic P1 segment bilaterally. Superior cerebellar artery patent bilaterally. MRA NECK FINDINGS Normal aortic arch. Standard three-vessel branching of the aortic arch. Mild stenosis proximal right common carotid artery which could be due to atherosclerotic disease or possibly artifact. Greater than 60% diameter stenosis proximal right internal carotid artery which is then patent without additional stenosis. Critical stenosis at the origin of the left internal carotid artery. Faint flow related signal in the left cervical internal carotid artery Both vertebral arteries patent to the basilar. Decreased signal at the origin bilaterally could be due to moderate stenosis or artifact from motion IMPRESSION: 1. Acute infarct right frontal parietal lobe  in a watershed territory distribution. No hemorrhage. 2. Greater than 60% diameter stenosis proximal right internal carotid artery. 3. Critical stenosis proximal left internal carotid artery with trickle flow to the skull base. 4. Mild stenosis A1 segment bilaterally. Moderate stenosis posterior division of the right middle cerebral artery. 5. Decreased signal at the origin of the vertebral artery which could be due to stenosis versus artifact. 6. These results were called by telephone at the time of interpretation on 01/20/2018 at 12:45 pm to Dr. Cherlynn Kaiser , who verbally acknowledged  these results. Electronically Signed   By: Marlan Palau M.D.   On: 01/20/2018 12:46   Mr Brain Wo Contrast  Result Date: 01/20/2018 CLINICAL DATA:  TIA.  Hypertension and headache. EXAM: MR HEAD WITHOUT CONTRAST MR CIRCLE OF WILLIS WITHOUT CONTRAST MRA OF THE NECK WITHOUT AND WITH CONTRAST TECHNIQUE: Multiplanar, multiecho pulse sequences of the brain, circle of willis and surrounding structures were obtained without intravenous contrast. Angiographic images of the neck were obtained using MRA technique without and with intravenous contrast. 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 This extends posteriorly into the occipital lobe. No acute infarct in the left hemisphere. Negative for hemorrhage or mass. Ventricle size normal.  No midline shift. Vascular: Abnormal left cavernous carotid which is very small likely due to slow flow. Right cavernous carotid normal flow void. Remainder circle-of-Willis normal flow void. Skull and upper cervical spine: Negative Sinuses/Orbits: Retention cyst right maxillary sinus.  Normal orbit. Other: None MR CIRCLE OF WILLIS FINDINGS Minimal flow related signal in the left cervical carotid and left cavernous carotid due to severe proximal stenosis. Left cavernous  carotid not well evaluated due to slow flow. Right cavernous carotid normal. Anterior and middle cerebral arteries patent bilaterally. Mild stenosis A1 segment bilaterally. Moderate stenosis in the posterior division of the right middle cerebral artery. Both vertebral arteries patent to the basilar. PICA patent bilaterally. Decreased signal distal basilar appears to be artifact from motion. Fetal origin of the posterior cerebral artery bilaterally. Hypoplastic P1 segment bilaterally. Superior cerebellar artery patent bilaterally. MRA NECK FINDINGS Normal aortic arch. Standard three-vessel branching of the aortic arch. Mild stenosis proximal right common carotid artery which could be due to atherosclerotic disease or possibly artifact. Greater than 60% diameter stenosis proximal right internal carotid artery which is then patent without additional stenosis. Critical stenosis at the origin of the left internal carotid artery. Faint flow related signal in the left cervical internal carotid artery Both vertebral arteries patent to the basilar. Decreased signal at the origin bilaterally could be due to moderate stenosis or artifact from motion IMPRESSION: 1. Acute infarct right frontal parietal lobe in a watershed territory distribution. No hemorrhage. 2. Greater than 60% diameter stenosis proximal right internal carotid artery. 3. Critical stenosis proximal left internal carotid artery with trickle flow to the skull base. 4. Mild stenosis A1 segment bilaterally. Moderate stenosis posterior division of the right middle cerebral artery. 5. Decreased signal at the origin of the vertebral artery which could be due to stenosis versus artifact. 6. These results were called by telephone at the time of interpretation on 01/20/2018 at 12:45 pm to Dr. Cherlynn Kaiser , who verbally acknowledged these results. Electronically Signed   By: Marlan Palau M.D.   On: 01/20/2018 12:46   US Carotid Bilateral (at [facility] And [facility] Only)  Result  Date: 01/20/2018 CLINICAL DATA:  TIA. Hypertension, syncope, visual disturbance, hyperlipidemia, previous tobacco abuse. EXAM: BILATERAL CAROTID DUPLEX ULTRASOUND TECHNIQUE: Wallace Cullens scale imaging, color Doppler and duplex ultrasound were performed of bilateral carotid and vertebral arteries in the neck. COMPARISON:  None. FINDINGS: Criteria: Quantification of carotid stenosis is based on velocity parameters that correlate the residual internal carotid diameter with NASCET-based stenosis levels, using the diameter of the distal internal carotid lumen as the denominator for stenosis measurement. The following velocity measurements were obtained: RIGHT ICA: 546/227 cm/sec CCA: 48/12 cm/sec SYSTOLIC ICA/CCA RATIO:  11.4 ECA: 84 cm/sec LEFT ICA: 36/18 cm/sec CCA: 55/11 cm/sec SYSTOLIC ICA/CCA RATIO:  0.7 ECA: 182 cm/sec RIGHT CAROTID ARTERY: Scattered partially calcified plaque through the common carotid artery. Circumferential somewhat irregular and partially calcified plaque in the bulb extending into proximal internal and external carotid arteries. High-grade stenosis in the proximal ICA with markedly elevated peak systolic velocities, spectral broadening, in focal aliasing on color Doppler. Elsewhere, normal waveforms and color Doppler signal. RIGHT VERTEBRAL ARTERY:  Normal flow direction and waveform. LEFT CAROTID ARTERY: Mild smooth plaque in the common carotid artery. Eccentric partially calcified plaque in the bulb and bifurcation. The lumen of the ICAs poorly visualized with no normal waveform. Possible string sign noted on power Doppler in the distal ICA. LEFT VERTEBRAL ARTERY:  Normal flow direction and waveform. IMPRESSION: 1. Right carotid bifurcation plaque resulting in near occlusive proximal ICA stenosis. Recommend vascular surgical or neurointerventional radiology consultation. 2. 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 See  above recommendation. 3.  Antegrade bilateral vertebral arterial flow. Electronically Signed   By: Corlis Leak M.D.   On: 01/20/2018 10:58   Dg Chest Port 1 View  Result Date: 01/19/2018 CLINICAL DATA:  Headache, hypertensive. EXAM: PORTABLE CHEST 1 VIEW COMPARISON:  Chest radiograph Jun 28, 2017 FINDINGS: Cardiomediastinal silhouette is normal. No pleural effusions or focal consolidations. Mild chronic bronchitic changes. Trachea projects midline and there is no pneumothorax. Soft tissue planes and included osseous structures are non-suspicious. Old RIGHT rib fractures. IMPRESSION: Mild chronic bronchitic changes. Electronically Signed   By: Awilda Metro M.D.   On: 01/19/2018 23:49   Mr Maxine Glenn Head Wo Contrast  Result Date: 01/20/2018 CLINICAL DATA:  TIA.  Hypertension and headache. EXAM: MR HEAD WITHOUT CONTRAST MR CIRCLE OF WILLIS WITHOUT CONTRAST MRA OF THE NECK WITHOUT AND WITH CONTRAST TECHNIQUE: Multiplanar, multiecho pulse sequences of the brain, circle of willis and surrounding structures were obtained without intravenous contrast. Angiographic images of the neck were obtained using MRA technique without and with intravenous contrast. 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 This extends posteriorly into the occipital lobe. No acute infarct in the left hemisphere. Negative for hemorrhage or mass. Ventricle size normal.  No midline shift. Vascular: Abnormal left cavernous carotid which is very small likely due to slow flow. Right cavernous carotid normal flow void. Remainder circle-of-Willis normal flow void. Skull and upper cervical spine: Negative Sinuses/Orbits: Retention cyst right maxillary sinus.  Normal orbit. Other: None MR CIRCLE OF WILLIS FINDINGS Minimal flow related signal in the left cervical carotid and left cavernous carotid due to severe  proximal stenosis. Left cavernous carotid not well evaluated due to slow flow. Right cavernous carotid normal. Anterior and middle cerebral arteries patent bilaterally. Mild stenosis A1 segment bilaterally. Moderate stenosis in the posterior division of the right middle cerebral artery. Both vertebral arteries patent to the basilar. PICA patent bilaterally. Decreased signal distal basilar appears to be artifact from motion. Fetal origin of the posterior cerebral artery bilaterally. Hypoplastic P1 segment bilaterally. Superior cerebellar artery patent bilaterally. MRA NECK FINDINGS Normal aortic arch. Standard three-vessel branching of the aortic arch. Mild stenosis proximal right common carotid artery which could be due to atherosclerotic disease or possibly artifact. Greater than 60% diameter stenosis proximal right internal carotid artery which is then patent without additional stenosis. Critical stenosis  at the origin of the left internal carotid artery. Faint flow related signal in the left cervical internal carotid artery Both vertebral arteries patent to the basilar. Decreased signal at the origin bilaterally could be due to moderate stenosis or artifact from motion IMPRESSION: 1. Acute infarct right frontal parietal lobe in a watershed territory distribution. No hemorrhage. 2. Greater than 60% diameter stenosis proximal right internal carotid artery. 3. Critical stenosis proximal left internal carotid artery with trickle flow to the skull base. 4. Mild stenosis A1 segment bilaterally. Moderate stenosis posterior division of the right middle cerebral artery. 5. Decreased signal at the origin of the vertebral artery which could be due to stenosis versus artifact. 6. These results were called by telephone at the time of interpretation on 01/20/2018 at 12:45 pm to Dr. Cherlynn Kaiser , who verbally acknowledged these results. Electronically Signed   By: Marlan Palau M.D.   On: 01/20/2018 12:46   Assessment/Plan The  patient is a 52 year old male with a past medical history of anxiety, bipolar 1 disorder, GERD, hyperlipidemia, hypertension, nephrolithiasis, current everyday smoker who presented to the [facility] with a chief complaint of "headache" however found to have an acute CVA 1. Carotid Stenosis: Reviewed images with Dr. Gilda Crease.  The patients imaging is notable for bilateral patent antegrade vertebral arteries.  Patent right intracranial carotid artery.  Unable to visualize the left intracranial carotid artery.  Right internal carotid artery is greater than 80% stenosed.  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  Since the patient's physical exam yields that he is neurologically intact and the size of his infarcts are relatively small would recommend the patient undergo a carotid angiogram with possible stent placement to the right internal carotid artery as well as confirmation the left internal carotid artery is occluded.  Procedure, risks and benefits explained to the patient.  All questions answered.  The patient wishes to proceed.  We will plan on doing this tomorrow.  We will preop the patient. 2. Tobacco Abuse: We had a discussion for approximately five minutes regarding the absolute need for smoking cessation due to the deleterious nature of tobacco on the vascular system. We discussed the tobacco use would diminish patency of any intervention, and likely significantly worsen progressio of disease. We discussed multiple agents for quitting including replacement therapy or medications to reduce cravings such as Chantix. The patient voices their understanding of the importance of smoking cessation. 3. Hyperlipidemia: On ASA and statin. Encouraged good control as its slows the progression of atherosclerotic disease.   Discussed with Dr. Romie Jumper, PA-C  01/20/2018 3:38 PM  This note was created with Dragon medical transcription system.  Any error is purely unintentional.

## 2018-01-20 NOTE — Progress Notes (Signed)
OT Cancellation Note  Patient Details Name: Timothy Pham MRN: [id number] DOB: 09/14/1965   Cancelled Treatment:    Reason Eval/Treat Not Completed: Patient declined, no reason specified(Pt agitated, declined to participate, very upset and wanting to leave hospital. Pt stated, "I just want to die". Nursing notified immediately. Attempted to provide emotional support which made the pt more agitated. )   Timothy Pham OTS  01/20/2018, 3:41 PM

## 2018-01-20 NOTE — H&P (Signed)
Timothy Pham is an 52 y.o. male.   Chief Complaint: Headache HPI: The patient with past medical history of remote seizure disorder and hyperlipidemia presents to the emergency department at the request of his coworkers who found him to be confused.  Apparently the patient became disoriented or lost while navigating his usual tasks at work.  The patient complains of a headache.  He denies chest pain, nausea, vomiting or diarrhea.  Apparently he is also looked unsteady on his feet at times.  Upon arrival the patient's blood pressure did but improved following nitroglycerin paste.  CT of his head revealed old frontal strokes but given the patient's hypertension and admitted reluctance to take medications the concern for new strokes or other neurologic lesions come to the emergency department staff asked the hospitalist service for admission.  History reviewed. No pertinent past medical history.  History reviewed. No pertinent surgical history.  History reviewed. No pertinent family history. Social History:  reports that he has been smoking cigarettes. He has been smoking about 1.00 pack per day. He has never used smokeless tobacco. He reports that he does not drink alcohol or use drugs.  Allergies:  Allergies  Allergen Reactions  . Wellbutrin [Bupropion] Itching    Medications Prior to Admission  Medication Sig Dispense Refill  . lamoTRIgine (LAMICTAL) 150 MG tablet Take 150 mg by mouth daily.    Marland Kitchen loratadine (CLARITIN) 10 MG tablet Take 10 mg by mouth daily.  0  . lovastatin (MEVACOR) 10 MG tablet Take 10 mg by mouth daily.   0  . omeprazole (PRILOSEC) 20 MG capsule Take 20 mg by mouth 2 (two) times daily.  0    Results for orders placed or performed during the hospital encounter of 01/19/18 (from the past 48 hour(s))  Basic metabolic panel     Status: Abnormal   Collection Time: 01/19/18 11:47 PM  Result Value Ref Range   Sodium 143 135 - 145 mmol/L   Potassium 3.9 3.5 - 5.1 mmol/L    Chloride 106 98 - 111 mmol/L   CO2 24 22 - 32 mmol/L   Glucose, Bld 107 (H) 70 - 99 mg/dL   BUN 15 6 - 20 mg/dL   Creatinine, Ser 1.61 0.61 - 1.24 mg/dL   Calcium 9.7 8.9 - 09.6 mg/dL   GFR calc non Af Amer >60 >60 mL/min   GFR calc Af Amer >60 >60 mL/min   Anion gap 13 5 - 15    Comment: Performed at [facility], [address] [place], [place] [address]  CBC     Status: Abnormal   Collection Time: 01/19/18 11:47 PM  Result Value Ref Range   WBC 12.8 (H) 4.0 - 10.5 K/uL   RBC 4.83 4.22 - 5.81 MIL/uL   Hemoglobin 15.3 13.0 - 17.0 g/dL   HCT 98.1 19.1 - 47.8 %   MCV 93.6 80.0 - 100.0 fL   MCH 31.7 26.0 - 34.0 pg   MCHC 33.8 30.0 - 36.0 g/dL   RDW 29.5 62.1 - 30.8 %   Platelets 299 150 - 400 K/uL   nRBC 0.0 0.0 - 0.2 %    Comment: Performed at [facility], [address] [place], [place] [address]  Troponin I - Once     Status: None   Collection Time: 01/19/18 11:47 PM  Result Value Ref Range   Troponin I <0.03 <0.03 ng/mL    Comment: Performed at [facility], [address] [place], [place]  [id number]  TSH     Status: None   Collection Time: 01/19/18 11:47 PM  Result Value Ref Range   TSH 2.816 0.350 - 4.500 uIU/mL    Comment: Performed by a 3rd Generation assay with a functional sensitivity of <=0.01 uIU/mL. Performed at [facility], [address] [place], [place] [address]   Lipid panel     Status: Abnormal   Collection Time: 01/19/18 11:47 PM  Result Value Ref Range   Cholesterol 266 (H) 0 - 200 mg/dL   Triglycerides 102222 (H) <150 mg/dL   HDL 53 >72>40 mg/dL   Total CHOL/HDL Ratio 5.0 RATIO   VLDL 44 (H) 0 - 40 mg/dL   LDL Cholesterol 536169 (H) 0 - 99 mg/dL    Comment:        Total Cholesterol/HDL:CHD Risk Coronary Heart Disease Risk Table                     Men   Women  1/2 Average Risk   3.4   3.3  Average Risk       5.0   4.4  2 X Average Risk   9.6   7.1  3 X Average Risk  23.4   11.0        Use the calculated  Patient Ratio above and the CHD Risk Table to determine the patient's CHD Risk.        ATP III CLASSIFICATION (LDL):  <100     mg/dL   Optimal  [id number]  mg/dL   Near or Above                    Optimal  130-159  mg/dL   Borderline  [phone]  mg/dL   High  >638>190     mg/dL   Very High Performed at [facility], [address] [place], [place] [address]   Ethanol     Status: None   Collection Time: 01/20/18 12:06 AM  Result Value Ref Range   Alcohol, Ethyl (B) <10 <10 mg/dL    Comment: (NOTE) Lowest detectable limit for serum alcohol is 10 mg/dL. For medical purposes only. Performed at [facility], [address] [place], [place] [address]   Urine Drug Screen, Qualitative [facility] only)     Status: Abnormal   Collection Time: 01/20/18 12:06 AM  Result Value Ref Range   Tricyclic, Ur Screen NONE DETECTED NONE DETECTED   Amphetamines, Ur Screen NONE DETECTED NONE DETECTED   MDMA (Ecstasy)Ur Screen NONE DETECTED NONE DETECTED   Cocaine Metabolite,Ur [place] NONE DETECTED NONE DETECTED   Opiate, Ur Screen NONE DETECTED NONE DETECTED   Phencyclidine (PCP) Ur S NONE DETECTED NONE DETECTED   Cannabinoid 50 Ng, Ur [place] POSITIVE (A) NONE DETECTED   Barbiturates, Ur Screen NONE DETECTED NONE DETECTED   Benzodiazepine, Ur Scrn NONE DETECTED NONE DETECTED   Methadone Scn, Ur NONE DETECTED NONE DETECTED    Comment: (NOTE) Tricyclics + metabolites, urine    Cutoff 1000 ng/mL Amphetamines + metabolites, urine  Cutoff 1000 ng/mL MDMA (Ecstasy), urine              Cutoff 500 ng/mL Cocaine Metabolite, urine          Cutoff 300 ng/mL Opiate + metabolites, urine        Cutoff 300 ng/mL Phencyclidine (PCP), urine         Cutoff 25 ng/mL Cannabinoid, urine  Cutoff 50 ng/mL Barbiturates + metabolites, urine  Cutoff 200 ng/mL Benzodiazepine, urine              Cutoff 200 ng/mL Methadone, urine                   Cutoff 300 ng/mL The urine drug screen provides only a  preliminary, unconfirmed analytical test result and should not be used for non-medical purposes. Clinical consideration and professional judgment should be applied to any positive drug screen result due to possible interfering substances. A more specific alternate chemical method must be used in order to obtain a confirmed analytical result. Gas chromatography / mass spectrometry (GC/MS) is the preferred confirmat ory method. Performed at [facility], [address] [place], [place] [address]    Ct Angio Head W Or Wo Contrast  Result Date: 01/20/2018 CLINICAL DATA:  Headache and difficulty concentrating for 2 days. Hypertensive. EXAM: CT ANGIOGRAPHY HEAD TECHNIQUE: Multidetector CT imaging of the head was performed using the standard protocol during bolus administration of intravenous contrast. Multiplanar CT image reconstructions and MIPs were obtained to evaluate the vascular anatomy. CONTRAST:  75mL OMNIPAQUE IOHEXOL 350 MG/ML SOLN COMPARISON:  None. FINDINGS: CT HEAD BRAIN: No intraparenchymal hemorrhage, mass effect nor midline shift. Linear RIGHT frontal and RIGHT parietal encephalomalacia. Smaller RIGHT inferior frontal lobe encephalomalacia. The ventricles and sulci are normal. No acute large vascular territory infarcts. No abnormal extra-axial fluid collections. Basal cisterns are patent. VASCULAR: Unremarkable. SKULL/SOFT TISSUES: No skull fracture. No significant soft tissue swelling. ORBITS/SINUSES: The included ocular globes and orbital contents are normal.Trace paranasal sinus mucosal thickening. Mastoid air cells are well aerated. OTHER: None. CTA HEAD ANTERIOR CIRCULATION: Diminutive LEFT petrous ICA, tine versus occluded in the cervical portion. RIGHT ICA is dominant. No large vessel occlusion, significant stenosis, contrast extravasation or aneurysm. POSTERIOR CIRCULATION: Patent vertebral arteries, vertebrobasilar junction and basilar artery, as well as main branch  vessels. Patent posterior cerebral arteries. Robust LEFT posterior communicating artery present. Fetal origin LEFT PCA. No large vessel occlusion, significant stenosis, contrast extravasation or aneurysm. VENOUS SINUSES: Major dural venous sinuses are patent though not tailored for evaluation on this angiographic examination. ANATOMIC VARIANTS: None. DELAYED PHASE: Not performed. MIP images reviewed. IMPRESSION: CT HEAD: 1. No acute intracranial process. 2. 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 Findings may be better demonstrated on non emergent MRI head. CTA HEAD: 1. No emergent large vessel occlusion or flow-limiting stenosis. 2. Asymmetrically smaller LEFT ICA, potentially occluded in the neck. Recommend CTA versus MRA neck on non emergent basis. Electronically Signed   By: Awilda Metro M.D.   On: 01/20/2018 02:15   Dg Chest Port 1 View  Result Date: 01/19/2018 CLINICAL DATA:  Headache, hypertensive. EXAM: PORTABLE CHEST 1 VIEW COMPARISON:  Chest radiograph Jun 28, 2017 FINDINGS: Cardiomediastinal silhouette is normal. No pleural effusions or focal consolidations. Mild chronic bronchitic changes. Trachea projects midline and there is no pneumothorax. Soft tissue planes and included osseous structures are non-suspicious. Old RIGHT rib fractures. IMPRESSION: Mild chronic bronchitic changes. Electronically Signed   By: Awilda Metro M.D.   On: 01/19/2018 23:49    Review of Systems  Constitutional: Negative for chills and fever.  HENT: Negative for sore throat and tinnitus.   Eyes: Negative for blurred vision and redness.  Respiratory: Negative for cough and shortness of breath.   Cardiovascular: Negative for chest pain, palpitations, orthopnea and PND.  Gastrointestinal: Negative for abdominal pain, diarrhea, nausea and vomiting.  Genitourinary:  Negative for dysuria, frequency and urgency.  Musculoskeletal: Negative for joint pain and myalgias.   Skin: Negative for rash.       No lesions  Neurological: Negative for speech change, focal weakness and weakness.  Endo/Heme/Allergies: Does not bruise/bleed easily.       No temperature intolerance  Psychiatric/Behavioral: Negative for depression and suicidal ideas.    Blood pressure (!) 150/79, pulse (!) 54, temperature 98.4 F (36.9 C), temperature source Oral, resp. rate 20, height 5\' 5"  (1.651 m), weight 78.2 kg, SpO2 95 %. Physical Exam  Vitals reviewed. Constitutional: He is oriented to person, place, and time. He appears well-developed and well-nourished. No distress.  HENT:  Head: Normocephalic and atraumatic.  Mouth/Throat: Oropharynx is clear and moist.  Eyes: Pupils are equal, round, and reactive to light. Conjunctivae and EOM are normal. No scleral icterus.  Neck: Normal range of motion. Neck supple. No JVD present. No tracheal deviation present. No thyromegaly present.  Cardiovascular: Normal rate, regular rhythm and normal heart sounds. Exam reveals no gallop and no friction rub.  No murmur heard. Respiratory: Effort normal and breath sounds normal. No respiratory distress. He has no wheezes.  GI: Soft. Bowel sounds are normal. He exhibits no distension. There is no tenderness.  Genitourinary:  Genitourinary Comments: Deferred  Musculoskeletal: Normal range of motion. He exhibits no edema.  Lymphadenopathy:    He has no cervical adenopathy.  Neurological: He is alert and oriented to person, place, and time. No cranial nerve deficit.  Skin: Skin is warm and dry. No rash noted. No erythema.  Psychiatric: He has a normal mood and affect. His behavior is normal. Judgment and thought content normal.     Assessment/Plan This is a 52 year old male admitted for headache. 1.  Headache inconsistent with thunderclap or headache concerning for aneurysm.  Likely related to uncontrolled hypertension.  Goal is to establish an antihypertensive regimen which the patient will be  compliant. 2.  Confusion: Associated with headache pain.  However this may also be due to intracranial syndromes associated with uncontrolled hypertension and/or stroke.  The frontal diagnosis includes TIA as well.  Obtain MRI as well as carotid ultrasound and echocardiogram. 3.  Seizure disorder: Continue Lamictal 4.  Hypertension: If blood pressure increases again consider initiation of one time a day medication to increase compliance 5.  Hyperlipidemia: Continue statin therapy 6.  Tobacco: NicoDerm patch while hospitalized 7.  DVT prophylaxis: Lovenox 8.  GI prophylaxis: Pantoprazole per home regimen The patient is a full code.  Time spent on admission orders and patient care approximately 45 minutes  Arnaldo Natal, MD 01/20/2018, 8:03 AM

## 2018-01-21 ENCOUNTER — Encounter
Admission: EM | Disposition: A | Discharge status: Home / Self Care | Payer: Self-pay | Source: Home / Self Care | Attending: Internal Medicine

## 2018-01-21 ENCOUNTER — Inpatient Hospital Stay (HOSPITAL_COMMUNITY)
Admission: RE | Admit: 2018-01-21 | Discharge: 2018-01-21 | Disposition: A | Discharge status: Home / Self Care | Payer: BLUE CROSS/BLUE SHIELD | Source: Home / Self Care | Attending: Internal Medicine | Admitting: Internal Medicine

## 2018-01-21 DIAGNOSIS — I63231 Cerebral infarction due to unspecified occlusion or stenosis of right carotid arteries: Secondary | ICD-10-CM | POA: Diagnosis present

## 2018-01-21 DIAGNOSIS — Z888 Allergy status to other drugs, medicaments and biological substances status: Secondary | ICD-10-CM | POA: Diagnosis not present

## 2018-01-21 DIAGNOSIS — I361 Nonrheumatic tricuspid (valve) insufficiency: Secondary | ICD-10-CM

## 2018-01-21 DIAGNOSIS — Z79899 Other long term (current) drug therapy: Secondary | ICD-10-CM | POA: Diagnosis not present

## 2018-01-21 DIAGNOSIS — R29700 NIHSS score 0: Secondary | ICD-10-CM | POA: Diagnosis present

## 2018-01-21 DIAGNOSIS — T465X6A Underdosing of other antihypertensive drugs, initial encounter: Secondary | ICD-10-CM | POA: Diagnosis present

## 2018-01-21 DIAGNOSIS — F329 Major depressive disorder, single episode, unspecified: Secondary | ICD-10-CM | POA: Diagnosis present

## 2018-01-21 DIAGNOSIS — Z66 Do not resuscitate: Secondary | ICD-10-CM | POA: Diagnosis present

## 2018-01-21 DIAGNOSIS — Z87442 Personal history of urinary calculi: Secondary | ICD-10-CM | POA: Diagnosis not present

## 2018-01-21 DIAGNOSIS — Z818 Family history of other mental and behavioral disorders: Secondary | ICD-10-CM | POA: Diagnosis not present

## 2018-01-21 DIAGNOSIS — E785 Hyperlipidemia, unspecified: Secondary | ICD-10-CM | POA: Diagnosis present

## 2018-01-21 DIAGNOSIS — G40909 Epilepsy, unspecified, not intractable, without status epilepticus: Secondary | ICD-10-CM | POA: Diagnosis present

## 2018-01-21 DIAGNOSIS — I10 Essential (primary) hypertension: Secondary | ICD-10-CM | POA: Diagnosis present

## 2018-01-21 DIAGNOSIS — K219 Gastro-esophageal reflux disease without esophagitis: Secondary | ICD-10-CM | POA: Diagnosis present

## 2018-01-21 DIAGNOSIS — G459 Transient cerebral ischemic attack, unspecified: Secondary | ICD-10-CM | POA: Diagnosis present

## 2018-01-21 DIAGNOSIS — Z9114 Patient's other noncompliance with medication regimen: Secondary | ICD-10-CM | POA: Diagnosis not present

## 2018-01-21 DIAGNOSIS — Z8249 Family history of ischemic heart disease and other diseases of the circulatory system: Secondary | ICD-10-CM | POA: Diagnosis not present

## 2018-01-21 DIAGNOSIS — F17210 Nicotine dependence, cigarettes, uncomplicated: Secondary | ICD-10-CM | POA: Diagnosis present

## 2018-01-21 DIAGNOSIS — F419 Anxiety disorder, unspecified: Secondary | ICD-10-CM | POA: Diagnosis present

## 2018-01-21 HISTORY — PX: CAROTID ANGIOGRAPHY: CATH118230

## 2018-01-21 LAB — CBC
HEMATOCRIT: 41.7 % (ref 39.0–52.0)
Hemoglobin: 13.9 g/dL (ref 13.0–17.0)
MCH: 31.6 pg (ref 26.0–34.0)
MCHC: 33.3 g/dL (ref 30.0–36.0)
MCV: 94.8 fL (ref 80.0–100.0)
NRBC: 0 % (ref 0.0–0.2)
Platelets: 278 10*3/uL (ref 150–400)
RBC: 4.4 MIL/uL (ref 4.22–5.81)
RDW: 13.3 % (ref 11.5–15.5)
WBC: 8.1 10*3/uL (ref 4.0–10.5)

## 2018-01-21 LAB — BASIC METABOLIC PANEL
Anion gap: 8 (ref 5–15)
BUN: 15 mg/dL (ref 6–20)
CO2: 26 mmol/L (ref 22–32)
Calcium: 9.1 mg/dL (ref 8.9–10.3)
Chloride: 108 mmol/L (ref 98–111)
Creatinine, Ser: 0.86 mg/dL (ref 0.61–1.24)
GFR calc Af Amer: >60 mL/min (ref >60)
GFR calc non Af Amer: >60 mL/min (ref >60)
Glucose, Bld: 90 mg/dL (ref 70–99)
Potassium: 3.6 mmol/L (ref 3.5–5.1)
Sodium: 142 mmol/L (ref 135–145)

## 2018-01-21 LAB — GLUCOSE, CAPILLARY: Glucose-Capillary: 70 mg/dL (ref 70–99)

## 2018-01-21 LAB — TYPE AND SCREEN
ABO/RH(D): A POS
ANTIBODY SCREEN: NEGATIVE

## 2018-01-21 LAB — ECHOCARDIOGRAM COMPLETE
Height: 65 in
Weight: 2760 oz

## 2018-01-21 LAB — PROTIME-INR
INR: 0.96
PROTHROMBIN TIME: 12.7 s (ref 11.4–15.2)

## 2018-01-21 LAB — MRSA PCR SCREENING: MRSA BY PCR: NEGATIVE

## 2018-01-21 LAB — APTT: aPTT: 29 seconds (ref 24–36)

## 2018-01-21 LAB — MAGNESIUM: Magnesium: 2 mg/dL (ref 1.7–2.4)

## 2018-01-21 SURGERY — CAROTID ANGIOGRAPHY
Anesthesia: Moderate Sedation | Laterality: Bilateral

## 2018-01-21 MED ORDER — SODIUM CHLORIDE 0.9 % IV SOLN
500.0000 mL | Freq: Once | INTRAVENOUS | Status: AC | PRN
  Administered 2018-01-22: 500 mL via INTRAVENOUS

## 2018-01-21 MED ORDER — CLOPIDOGREL BISULFATE 75 MG PO TABS
75.0000 mg | ORAL_TABLET | Freq: Every day | ORAL | Status: DC
  Administered 2018-01-22: 75 mg via ORAL
  Filled 2018-01-21: qty 1

## 2018-01-21 MED ORDER — CEFAZOLIN SODIUM-DEXTROSE 2-4 GM/100ML-% IV SOLN
INTRAVENOUS | Status: AC
  Filled 2018-01-21: qty 100

## 2018-01-21 MED ORDER — OXYCODONE-ACETAMINOPHEN 5-325 MG PO TABS
1.0000 | ORAL_TABLET | Freq: Once | ORAL | Status: AC
  Administered 2018-01-21: 1 via ORAL

## 2018-01-21 MED ORDER — HEPARIN SODIUM (PORCINE) 1000 UNIT/ML IJ SOLN
INTRAMUSCULAR | Status: AC
  Filled 2018-01-21: qty 1

## 2018-01-21 MED ORDER — MIDAZOLAM HCL 2 MG/2ML IJ SOLN
INTRAMUSCULAR | Status: DC | PRN
  Administered 2018-01-21: 2 mg via INTRAVENOUS
  Administered 2018-01-21: 1 mg via INTRAVENOUS

## 2018-01-21 MED ORDER — FENTANYL CITRATE (PF) 100 MCG/2ML IJ SOLN
INTRAMUSCULAR | Status: AC
  Filled 2018-01-21: qty 2

## 2018-01-21 MED ORDER — FENTANYL CITRATE (PF) 100 MCG/2ML IJ SOLN
INTRAMUSCULAR | Status: DC | PRN
  Administered 2018-01-21 (×2): 50 ug via INTRAVENOUS

## 2018-01-21 MED ORDER — DOCUSATE SODIUM 100 MG PO CAPS: 100.0000 mg | ORAL_CAPSULE | Freq: Every day | ORAL | Status: DC

## 2018-01-21 MED ORDER — HYDRALAZINE HCL 20 MG/ML IJ SOLN: 5.0000 mg | INTRAMUSCULAR | Status: DC | PRN

## 2018-01-21 MED ORDER — LABETALOL HCL 5 MG/ML IV SOLN: 10.0000 mg | INTRAVENOUS | Status: DC | PRN

## 2018-01-21 MED ORDER — PHENOL 1.4 % MT LIQD
1.0000 | OROMUCOSAL | Status: DC | PRN
  Filled 2018-01-21: qty 177

## 2018-01-21 MED ORDER — GUAIFENESIN-DM 100-10 MG/5ML PO SYRP
15.0000 mL | ORAL_SOLUTION | ORAL | Status: DC | PRN
  Filled 2018-01-21: qty 15

## 2018-01-21 MED ORDER — ACETAMINOPHEN 325 MG RE SUPP
325.0000 mg | RECTAL | Status: DC | PRN
  Filled 2018-01-21: qty 2

## 2018-01-21 MED ORDER — ASPIRIN EC 81 MG PO TBEC: 81.0000 mg | DELAYED_RELEASE_TABLET | Freq: Every day | ORAL | Status: DC

## 2018-01-21 MED ORDER — ATROPINE SULFATE 1 MG/10ML IJ SOSY
PREFILLED_SYRINGE | INTRAMUSCULAR | Status: AC
  Filled 2018-01-21: qty 10

## 2018-01-21 MED ORDER — PHENYLEPHRINE HCL 10 MG/ML IJ SOLN
INTRAMUSCULAR | Status: AC
  Filled 2018-01-21: qty 1

## 2018-01-21 MED ORDER — HEPARIN SODIUM (PORCINE) 1000 UNIT/ML IJ SOLN
INTRAMUSCULAR | Status: DC | PRN
  Administered 2018-01-21: 7000 [IU] via INTRAVENOUS
  Administered 2018-01-21: 2000 [IU] via INTRAVENOUS

## 2018-01-21 MED ORDER — CLOPIDOGREL BISULFATE 75 MG PO TABS
300.0000 mg | ORAL_TABLET | ORAL | Status: AC
  Administered 2018-01-21: 300 mg via ORAL
  Filled 2018-01-21: qty 4

## 2018-01-21 MED ORDER — IOPAMIDOL (ISOVUE-300) INJECTION 61%
INTRAVENOUS | Status: DC | PRN
  Administered 2018-01-21: 90 mL via INTRAVENOUS

## 2018-01-21 MED ORDER — CEFAZOLIN SODIUM-DEXTROSE 2-4 GM/100ML-% IV SOLN
2.0000 g | Freq: Three times a day (TID) | INTRAVENOUS | Status: AC
  Administered 2018-01-21 – 2018-01-22 (×2): 2 g via INTRAVENOUS
  Filled 2018-01-21 (×2): qty 100

## 2018-01-21 MED ORDER — LIDOCAINE-EPINEPHRINE (PF) 1 %-1:200000 IJ SOLN
INTRAMUSCULAR | Status: AC
  Filled 2018-01-21: qty 10

## 2018-01-21 MED ORDER — ALUM & MAG HYDROXIDE-SIMETH 200-200-20 MG/5ML PO SUSP
15.0000 mL | ORAL | Status: DC | PRN
  Administered 2018-01-21: 30 mL via ORAL
  Filled 2018-01-21 (×2): qty 30

## 2018-01-21 MED ORDER — ONDANSETRON HCL 4 MG/2ML IJ SOLN: 4.0000 mg | Freq: Four times a day (QID) | INTRAMUSCULAR | Status: DC | PRN

## 2018-01-21 MED ORDER — HEPARIN (PORCINE) IN NACL 1000-0.9 UT/500ML-% IV SOLN
INTRAVENOUS | Status: AC
  Filled 2018-01-21: qty 1000

## 2018-01-21 MED ORDER — LAMOTRIGINE 25 MG PO TABS: 100.0000 mg | ORAL_TABLET | Freq: Two times a day (BID) | ORAL | Status: DC

## 2018-01-21 MED ORDER — VALPROATE SODIUM 500 MG/5ML IV SOLN: 500.0000 mg | Freq: Once | INTRAVENOUS | Status: DC

## 2018-01-21 MED ORDER — MIDAZOLAM HCL 5 MG/5ML IJ SOLN
INTRAMUSCULAR | Status: AC
  Filled 2018-01-21: qty 5

## 2018-01-21 MED ORDER — ACETAMINOPHEN 325 MG PO TABS: 325.0000 mg | ORAL_TABLET | ORAL | Status: DC | PRN

## 2018-01-21 MED ORDER — DIPHENHYDRAMINE HCL 50 MG/ML IJ SOLN
12.5000 mg | Freq: Once | INTRAMUSCULAR | Status: AC
  Administered 2018-01-21: 04:00:00 12.5 mg via INTRAVENOUS
  Filled 2018-01-21 (×2): qty 0.25

## 2018-01-21 MED ORDER — PANTOPRAZOLE SODIUM 40 MG IV SOLR: 40.0000 mg | Freq: Every day | INTRAVENOUS | Status: DC

## 2018-01-21 MED ORDER — KCL IN DEXTROSE-NACL 20-5-0.9 MEQ/L-%-% IV SOLN
INTRAVENOUS | Status: DC
  Filled 2018-01-21 (×3): qty 1000

## 2018-01-21 MED ORDER — VALPROATE SODIUM 500 MG/5ML IV SOLN
1000.0000 mg | Freq: Once | INTRAVENOUS | Status: AC
  Administered 2018-01-21: 13:00:00 1000 mg via INTRAVENOUS
  Filled 2018-01-21: qty 10

## 2018-01-21 MED ORDER — OXYCODONE-ACETAMINOPHEN 5-325 MG PO TABS
1.0000 | ORAL_TABLET | Freq: Four times a day (QID) | ORAL | Status: DC | PRN
  Administered 2018-01-21 (×2): 1 via ORAL
  Filled 2018-01-21 (×3): qty 1

## 2018-01-21 MED ORDER — LAMOTRIGINE 25 MG PO TABS
150.0000 mg | ORAL_TABLET | Freq: Every day | ORAL | Status: DC
  Filled 2018-01-21: qty 2

## 2018-01-21 MED ORDER — ATROPINE SULFATE 1 MG/10ML IJ SOSY
PREFILLED_SYRINGE | INTRAMUSCULAR | Status: DC | PRN
  Administered 2018-01-21: 1 mg via INTRAVENOUS

## 2018-01-21 MED ORDER — METOPROLOL TARTRATE 5 MG/5ML IV SOLN: 2.0000 mg | INTRAVENOUS | Status: DC | PRN

## 2018-01-21 SURGICAL SUPPLY — 19 items
BALLN VIATRAC 5X30X135 (BALLOONS) ×2
BALLOON VIATRAC 5X30X135 (BALLOONS) ×1 IMPLANT
CATH ANGIO 5F 100CM .035 PIG (CATHETERS) ×2 IMPLANT
CATH BEACON 5 .035 100 H1 TIP (CATHETERS) ×2 IMPLANT
DEVICE EMBOSHIELD NAV6 4.0-7.0 (FILTER) ×2 IMPLANT
DEVICE PRESTO INFLATION (MISCELLANEOUS) ×2 IMPLANT
DEVICE STARCLOSE SE CLOSURE (Vascular Products) ×2 IMPLANT
DEVICE TORQUE .025-.038 (MISCELLANEOUS) ×2 IMPLANT
GLIDEWIRE ANGLED SS 035X260CM (WIRE) ×2 IMPLANT
KIT CAROTID MANIFOLD (MISCELLANEOUS) ×2 IMPLANT
NEEDLE ENTRY 21GA 7CM ECHOTIP (NEEDLE) ×2 IMPLANT
PACK ANGIOGRAPHY (CUSTOM PROCEDURE TRAY) ×2 IMPLANT
SET INTRO CAPELLA COAXIAL (SET/KITS/TRAYS/PACK) ×2 IMPLANT
SHEATH BRITE TIP 6FRX11 (SHEATH) ×2 IMPLANT
SHEATH SHUTTLE 6FR (SHEATH) ×4 IMPLANT
STENT XACT CAR 10-8X40X136 (Permanent Stent) ×2 IMPLANT
TUBING CONTRAST HIGH PRESS 72 (TUBING) ×2 IMPLANT
WIRE G VAS 035X260 STIFF (WIRE) ×2 IMPLANT
WIRE J 3MM .035X145CM (WIRE) ×2 IMPLANT

## 2018-01-21 NOTE — Care Management Note (Addendum)
Case Management Note  Patient Details  Name: Timothy SieveKenneth Pham MRN: [id number] Date of Birth: 12/22/1965  Subjective/Objective:   Admitted to [facility] with the diagnosis of headache. Lives alone. Brother is Betti CruzRoy Nygard [phone]. [facility] Iv[facility]Goeres [facility] 12/17/17. Pres[facility].  MR + for an acute infarct.      No home health.   No skilled facility. No medical equipment in the home.  Takes care of all basic activities of daily living himself, drives. 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 ([employer]).  No falls, Decreased appetite.    Action/Plan: Will continue to follow for transition of care needs   Expected Discharge Date:                  Expected Discharge Plan:     In-House Referral:     Discharge planning Services     Post Acute Care Choice:    Choice offered to:     DME Arranged:    DME Agency:     HH Arranged:    HH Agency:     Status of Service:     If discussed at [employer] Length of Stay Meetings, dates discussed:    Additional Comments:  Gwenette GreetBrenda S Saundra Gin, RN MSN CCM Care Management [phone] 01/21/2018, 9:17 AM

## 2018-01-21 NOTE — Evaluation (Signed)
Occupational Therapy Evaluation Patient Details Name: Timothy Pham MRN: [id number] DOB: 10-27-1965 Today's Date: 01/21/2018    History of Present Illness 52 y/o male here with a few days of confusion, coordination issues and generally feeling off.  EMS found his BP to be 200s/100s.  MRI + for acute frontal parietal infarct.   Clinical Impression   Pt seen for OT evaluation this date. Prior to hospital admission, pt was independent, working, driving. Pt lives by himself. Currently pt demonstrates mild impairments in cognition (problem solving, safety awareness) requiring supervision to SBA for mobility and LB ADL from sit<>stand.  Pt would benefit from skilled OT on an outpatient basis to address noted impairments and functional limitations (see below for any additional details) in order to maximize safety and independence while minimizing falls risk and caregiver burden. All further OT needs can be met on an outpatient basis.    Follow Up Recommendations  Outpatient OT;Supervision - Intermittent    Equipment Recommendations  None recommended by OT    Recommendations for Other Services       Precautions / Restrictions Precautions Precautions: None Restrictions Weight Bearing Restrictions: No      Mobility Bed Mobility Overal bed mobility: Modified Independent                Transfers Overall transfer level: Modified independent Equipment used: None                  Balance Overall balance assessment: Modified Independent                                         ADL either performed or assessed with clinical judgement   ADL Overall ADL's : Needs assistance/impaired                                       General ADL Comments: close SBA for LB ADL in standing (i.e., complete dressing over hips)     Vision Patient Visual Report: No change from baseline       Perception     Praxis      Pertinent Vitals/Pain Pain  Assessment: 0-10 Pain Score: 7  Pain Location: L great toe pain (gout) Pain Descriptors / Indicators: Aching;Grimacing Pain Intervention(s): Limited activity within patient's tolerance;Monitored during session;Repositioned;Patient requesting pain meds-RN notified     Hand Dominance Right   Extremity/Trunk Assessment Upper Extremity Assessment Upper Extremity Assessment: Overall WFL for tasks assessed   Lower Extremity Assessment Lower Extremity Assessment: Overall WFL for tasks assessed   Cervical / Trunk Assessment Cervical / Trunk Assessment: Normal   Communication Communication Communication: No difficulties   Cognition Arousal/Alertness: Awake/alert Behavior During Therapy: WFL for tasks assessed/performed Overall Cognitive Status: Impaired/Different from baseline Area of Impairment: Safety/judgement;Problem solving                         Safety/Judgement: Decreased awareness of safety   Problem Solving: Slow processing General Comments: mild cognitive deficits noted, particularly regarding safety awareness and problem solving. Pt reports over the last few days he has forgotten directions while driving, leaving stove on, confusion with minimally complex/daily tasks (job, tasks at home, etc)   General Comments       Exercises Other Exercises Other Exercises: pt participated in safety awareness/problem solving  exercise, stated "I'd check on it" in regards to several safety scenarios presented (e.g., if someone broke into your house, if you saw a fire in the house)   Shoulder Instructions      Home Living Family/patient expects to be discharged to:: Private residence Living Arrangements: Alone     Home Access: Stairs to enter Entrance Stairs-Number of Steps: 5 Entrance Stairs-Rails: [place]: One level     Bathroom Shower/Tub: Teacher, early years/pre: Standard     Home Equipment: None          Prior  Functioning/Environment Level of Independence: Independent        Comments: Pt works, drives, runs errands, etc w/o issue at baseline        OT Problem List: Decreased safety awareness      OT Treatment/Interventions:      OT Goals(Current goals can be found in the care plan section) Acute Rehab OT Goals Patient Stated Goal: get the surgery and get back to work OT Goal Formulation: All assessment and education complete, DC therapy ADL Goals Pt Will Transfer to Toilet: Independently;ambulating(LRAD for amb) Additional ADL Goal #1: Pt will complete safety awareness/problem solving activity with >90% accuracy to maximize safety. Additional ADL Goal #2: Pt will perform daily ADL routine with modified independence and no verbal cues for safety.  OT Frequency:     Barriers to D/C:            Co-evaluation              AM-PAC OT "6 Clicks" Daily Activity     Outcome Measure Help from another person eating meals?: None Help from another person taking care of personal grooming?: None Help from another person toileting, which includes using toliet, bedpan, or urinal?: A Little Help from another person bathing (including washing, rinsing, drying)?: A Little Help from another person to put on and taking off regular upper body clothing?: None Help from another person to put on and taking off regular lower body clothing?: A Little 6 Click Score: 21   End of Session Nurse Communication: Patient requests pain meds  Activity Tolerance: Patient tolerated treatment well Patient left: in bed;with call bell/phone within reach  OT Visit Diagnosis: Other symptoms and signs involving cognitive function                Time: 3716-9678 OT Time Calculation (min): 11 min Charges:  OT General Charges $OT Visit: 1 Visit OT Evaluation $OT Eval Low Complexity: 1 Low  Jeni Salles, MPH, MS, OTR/L ascom [phone] 01/21/18, 10:38 AM

## 2018-01-21 NOTE — Plan of Care (Signed)
Pt to ICU post carotid stent to right carotid, pt expresses he has no interest in smoking cessation and states he will smoke and drink beer as soon as he gets home, he expresses "I can't believe I had a stroke."  Writing RN attempted to talk to him, he states he will continue as he has in the past and that his psych meds do not help with his moods and depression.  He has no s/sx stroke at this time.  Vasc site is level 0 and he reports no soreness.  Appetite is excellent

## 2018-01-21 NOTE — Op Note (Signed)
OPERATIVE NOTE DATE: 01/21/2018  PROCEDURE: 1.  Ultrasound guidance for vascular access right common femoral artery 2.  Placement of a 10 x 8 exact stent with the use of the NAV-6 embolic protection device in the right internal carotid artery  PRE-OPERATIVE DIAGNOSIS: 1.  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 2.  Multiple acute right hemispheric infarcts  POST-OPERATIVE DIAGNOSIS:  Same as above  SURGEON: Renford DillsGregory G Schnier, MD  ASSISTANT(S): Annice NeedyJason S Dew, MD  ANESTHESIA: local/MCS  ESTIMATED BLOOD LOSS: 75 cc  CONTRAST: 90 cc  FLUORO TIME: 6.1 minutes  MODERATE CONSCIOUS SEDATION TIME:  Approximately 51 minutes using 3 mg of Versed and 100 Mcg of Fentanyl  FINDING(S): 1.   Greater than 90% right internal carotid artery stenosis; no evidence of stenosis at the origin of the common carotid artery verified in multiple views (as was described in the [facility]).  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  SPECIMEN(S):   none  INDICATIONS:   Patient is a 52 y.o. male who presents with acute right hemispheric stroke associated with greater than 90% stenosis of the right internal carotid artery.  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  Given these findings as well as his acute multiple infarcts carotid artery stenting was felt to be preferred to endarterectomy.  Risks and benefits were discussed and informed consent was obtained.   DESCRIPTION: After obtaining full informed written consent, the patient was brought back to the vascular suite and placed supine upon the table.  The patient received IV antibiotics prior to induction. Moderate conscious sedation was administered during a face to face encounter with the patient throughout the procedure with my supervision  of the RN administering medicines and monitoring the patients vital signs and mental status throughout from the start of the procedure until the patient was taken to the recovery room.  After obtaining adequate sedation, the patient was prepped and draped in the standard fashion.   A first assistant was needed for patient safety and to make the procedure more efficient.  Reasons included wire manipulations as well as assistance and imaging.  The right femoral artery was visualized with ultrasound and found to be widely patent. It was then accessed under direct ultrasound guidance without difficulty with a Seldinger needle. A permanent image was recorded. A J-wire was placed and we then placed a 6 French sheath. The patient was then heparinized and a total of 9000 units of intravenous heparin were given and an ACT was checked to confirm successful anticoagulation. A pigtail catheter was then placed into the ascending aorta.  LAO view of the arch was performed which showed a type I arch no evidence of ostial stenosis of the right common carotid, innominate, or origin of the left common carotid.  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  The detector was then positioned into the RAO at 25 degrees and an arch injection was again performed to better visualize the origin of the right common carotid.  This images demonstrated wide patency with no evidence of significant stenosis at the origin of the right common carotid.  I then selectively cannulated the innominate and then the common carotid on the right without difficulty with a H1 catheter and advanced into the mid  right common carotid artery.  Cervical and cerebral carotid angiography was then performed. 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 The carotid bifurcation demonstrated greater than 90% stenosis at the origin of the right internal carotid artery.  I then advanced into the external  carotid artery with a Glidewire and the H1 catheter and then exchanged for the Amplatz Super Stiff wire. Over the Amplatz Super Stiff wire, a 6 [place] shuttle sheath was placed into the mid common carotid artery. I then used the NAV-6  Embolic protection device and crossed the lesion and parked this in the distal internal carotid artery at the base of the skull.  I then selected a 10 x 8 exact stent. This was deployed across the lesion encompassing it in its entirety. A 5 mm wide by 30 mm length balloon was used to post dilate the stent. Only about a 20 % residual stenosis was present after angioplasty. Completion angiogram showed normal intracranial filling without new defects. At this point I elected to terminate the procedure. The sheath was removed and StarClose closure device was deployed in the right femoral artery with excellent hemostatic result. The patient was taken to the recovery room in stable condition having tolerated the procedure well.  COMPLICATIONS: none  CONDITION: stable  Levora Dredge 01/21/2018 3:05 PM   This note was created with Dragon Medical transcription system. Any errors in dictation are purely unintentional.

## 2018-01-21 NOTE — Progress Notes (Addendum)
[facility] [place] [facility]      PATIENT NAME: Timothy Pham    MR#:  [id number]  DATE OF BIRTH:  09/05/1965  SUBJECTIVE:   The patient has no complaints.  REVIEW OF SYSTEMS:    Review of Systems  Constitutional: Negative for chills and fever.  HENT: Negative for congestion and tinnitus.   Eyes: Negative for blurred vision and double vision.  Respiratory: Negative for cough, shortness of breath and wheezing.   Cardiovascular: Negative for chest pain, orthopnea and PND.  Gastrointestinal: Negative for abdominal pain, diarrhea, nausea and vomiting.  Genitourinary: Negative for dysuria and hematuria.  Musculoskeletal: Negative for joint pain.  Skin: Negative for rash.  Neurological: Negative for dizziness, sensory change and focal weakness.  Psychiatric/Behavioral: Negative for depression. The patient is not nervous/anxious.   All other systems reviewed and are negative.   Nutrition: Heart Healthy Tolerating Diet: Yes Tolerating PT: Await Eval.   DRUG ALLERGIES:   Allergies  Allergen Reactions  . Wellbutrin [Bupropion] Itching    VITALS:  Blood pressure 101/70, pulse (!) 58, temperature 98 F (36.7 C), temperature source Oral, resp. rate 15, height 5\' 5"  (1.651 m), weight 78.2 kg, SpO2 100 %.  PHYSICAL EXAMINATION:   Physical Exam  GENERAL:  52 y.o.-year-old patient lying in bed in no acute distress.  EYES: Pupils equal, round, reactive to light and accommodation. No scleral icterus. Extraocular muscles intact.  HEENT: Head atraumatic, normocephalic. Oropharynx and nasopharynx clear.  NECK:  Supple, no jugular venous distention. No thyroid enlargement, no tenderness.  LUNGS: Normal breath sounds bilaterally, no wheezing, rales, rhonchi. No use of accessory muscles of respiration.  CARDIOVASCULAR: S1, S2 normal. No murmurs, rubs, or gallops.  ABDOMEN: Soft, nontender, nondistended. Bowel sounds present. No organomegaly or mass.  EXTREMITIES:  No cyanosis, clubbing or edema b/l.    NEUROLOGIC: Cranial nerves II through XII are intact. No focal Motor or sensory deficits b/l.   PSYCHIATRIC: The patient is alert and oriented x 3.  SKIN: No obvious rash, lesion, or ulcer.    LABORATORY PANEL:   CBC Recent Labs  Lab 01/21/18 0522  WBC 8.1  HGB 13.9  HCT 41.7  PLT 278   ------------------------------------------------------------------------------------------------------------------  Chemistries  Recent Labs  Lab 01/21/18 0522  NA 142  K 3.6  CL 108  CO2 26  GLUCOSE 90  BUN 15  CREATININE 0.86  CALCIUM 9.1  MG 2.0   ------------------------------------------------------------------------------------------------------------------  Cardiac Enzymes Recent Labs  Lab 01/19/18 2347  TROPONINI <0.03   ------------------------------------------------------------------------------------------------------------------  RADIOLOGY:  Ct Angio Head W Or Wo Contrast  Result Date: 01/20/2018 CLINICAL DATA:  Headache and difficulty concentrating for 2 days. Hypertensive. EXAM: CT ANGIOGRAPHY HEAD TECHNIQUE: Multidetector CT imaging of the head was performed using the standard protocol during bolus administration of intravenous contrast. Multiplanar CT image reconstructions and MIPs were obtained to evaluate the vascular anatomy. CONTRAST:  75mL OMNIPAQUE IOHEXOL 350 MG/ML SOLN COMPARISON:  None. FINDINGS: CT HEAD BRAIN: No intraparenchymal hemorrhage, mass effect nor midline shift. Linear RIGHT frontal and RIGHT parietal encephalomalacia. Smaller RIGHT inferior frontal lobe encephalomalacia. The ventricles and sulci are normal. No acute large vascular territory infarcts. No abnormal extra-axial fluid collections. Basal cisterns are patent. VASCULAR: Unremarkable. SKULL/SOFT TISSUES: No skull fracture. No significant soft tissue swelling. ORBITS/SINUSES: The included ocular globes and orbital contents are normal.Trace paranasal sinus  mucosal thickening. Mastoid air cells are well aerated. OTHER: None. CTA HEAD ANTERIOR CIRCULATION: Diminutive LEFT petrous ICA, tine versus occluded in  the cervical portion. RIGHT ICA is dominant. No large vessel occlusion, significant stenosis, contrast extravasation or aneurysm. POSTERIOR CIRCULATION: Patent vertebral arteries, vertebrobasilar junction and basilar artery, as well as main branch vessels. Patent posterior cerebral arteries. Robust LEFT posterior communicating artery present. Fetal origin LEFT PCA. No large vessel occlusion, significant stenosis, contrast extravasation or aneurysm. VENOUS SINUSES: Major dural venous sinuses are patent though not tailored for evaluation on this angiographic examination. ANATOMIC VARIANTS: None. DELAYED PHASE: Not performed. MIP images reviewed. IMPRESSION: CT HEAD: 1. No acute intracranial process. 2. 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 Findings may be better demonstrated on non emergent MRI head. CTA HEAD: 1. No emergent large vessel occlusion or flow-limiting stenosis. 2. Asymmetrically smaller LEFT ICA, potentially occluded in the neck. Recommend CTA versus MRA neck on non emergent basis. Electronically Signed   By: Awilda Metro M.D.   On: 01/20/2018 02:15   Mr Maxine Glenn Neck W Wo Contrast  Result Date: 01/20/2018 CLINICAL DATA:  TIA.  Hypertension and headache. EXAM: MR HEAD WITHOUT CONTRAST MR CIRCLE OF WILLIS WITHOUT CONTRAST MRA OF THE NECK WITHOUT AND WITH CONTRAST TECHNIQUE: Multiplanar, multiecho pulse sequences of the brain, circle of willis and surrounding structures were obtained without intravenous contrast. Angiographic images of the neck were obtained using MRA technique without and with intravenous contrast. 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 This extends  posteriorly into the occipital lobe. No acute infarct in the left hemisphere. Negative for hemorrhage or mass. Ventricle size normal.  No midline shift. Vascular: Abnormal left cavernous carotid which is very small likely due to slow flow. Right cavernous carotid normal flow void. Remainder circle-of-Willis normal flow void. Skull and upper cervical spine: Negative Sinuses/Orbits: Retention cyst right maxillary sinus.  Normal orbit. Other: None MR CIRCLE OF WILLIS FINDINGS Minimal flow related signal in the left cervical carotid and left cavernous carotid due to severe proximal stenosis. Left cavernous carotid not well evaluated due to slow flow. Right cavernous carotid normal. Anterior and middle cerebral arteries patent bilaterally. Mild stenosis A1 segment bilaterally. Moderate stenosis in the posterior division of the right middle cerebral artery. Both vertebral arteries patent to the basilar. PICA patent bilaterally. Decreased signal distal basilar appears to be artifact from motion. Fetal origin of the posterior cerebral artery bilaterally. Hypoplastic P1 segment bilaterally. Superior cerebellar artery patent bilaterally. MRA NECK FINDINGS Normal aortic arch. Standard three-vessel branching of the aortic arch. Mild stenosis proximal right common carotid artery which could be due to atherosclerotic disease or possibly artifact. Greater than 60% diameter stenosis proximal right internal carotid artery which is then patent without additional stenosis. Critical stenosis at the origin of the left internal carotid artery. Faint flow related signal in the left cervical internal carotid artery Both vertebral arteries patent to the basilar. Decreased signal at the origin bilaterally could be due to moderate stenosis or artifact from motion IMPRESSION: 1. Acute infarct right frontal parietal lobe in a watershed territory distribution. No hemorrhage. 2. Greater than 60% diameter stenosis proximal right internal carotid  artery. 3. Critical stenosis proximal left internal carotid artery with trickle flow to the skull base. 4. Mild stenosis A1 segment bilaterally. Moderate stenosis posterior division of the right middle cerebral artery. 5. Decreased signal at the origin of the vertebral artery which could be due to stenosis versus artifact. 6. These results were called  by telephone at the time of interpretation on 01/20/2018 at 12:45 pm to Dr. Cherlynn Kaiser , who verbally acknowledged these results. Electronically Signed   By: Marlan Palau M.D.   On: 01/20/2018 12:46   Mr Brain Wo Contrast  Result Date: 01/20/2018 CLINICAL DATA:  TIA.  Hypertension and headache. EXAM: MR HEAD WITHOUT CONTRAST MR CIRCLE OF WILLIS WITHOUT CONTRAST MRA OF THE NECK WITHOUT AND WITH CONTRAST TECHNIQUE: Multiplanar, multiecho pulse sequences of the brain, circle of willis and surrounding structures were obtained without intravenous contrast. Angiographic images of the neck were obtained using MRA technique without and with intravenous contrast. 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 This extends posteriorly into the occipital lobe. No acute infarct in the left hemisphere. Negative for hemorrhage or mass. Ventricle size normal.  No midline shift. Vascular: Abnormal left cavernous carotid which is very small likely due to slow flow. Right cavernous carotid normal flow void. Remainder circle-of-Willis normal flow void. Skull and upper cervical spine: Negative Sinuses/Orbits: Retention cyst right maxillary sinus.  Normal orbit. Other: None MR CIRCLE OF WILLIS FINDINGS Minimal flow related signal in the left cervical carotid and left cavernous carotid due to severe proximal stenosis. Left cavernous carotid not well evaluated due to slow flow. Right cavernous carotid normal. Anterior and middle cerebral arteries patent  bilaterally. Mild stenosis A1 segment bilaterally. Moderate stenosis in the posterior division of the right middle cerebral artery. Both vertebral arteries patent to the basilar. PICA patent bilaterally. Decreased signal distal basilar appears to be artifact from motion. Fetal origin of the posterior cerebral artery bilaterally. Hypoplastic P1 segment bilaterally. Superior cerebellar artery patent bilaterally. MRA NECK FINDINGS Normal aortic arch. Standard three-vessel branching of the aortic arch. Mild stenosis proximal right common carotid artery which could be due to atherosclerotic disease or possibly artifact. Greater than 60% diameter stenosis proximal right internal carotid artery which is then patent without additional stenosis. Critical stenosis at the origin of the left internal carotid artery. Faint flow related signal in the left cervical internal carotid artery Both vertebral arteries patent to the basilar. Decreased signal at the origin bilaterally could be due to moderate stenosis or artifact from motion IMPRESSION: 1. Acute infarct right frontal parietal lobe in a watershed territory distribution. No hemorrhage. 2. Greater than 60% diameter stenosis proximal right internal carotid artery. 3. Critical stenosis proximal left internal carotid artery with trickle flow to the skull base. 4. Mild stenosis A1 segment bilaterally. Moderate stenosis posterior division of the right middle cerebral artery. 5. Decreased signal at the origin of the vertebral artery which could be due to stenosis versus artifact. 6. These results were called by telephone at the time of interpretation on 01/20/2018 at 12:45 pm to Dr. Cherlynn Kaiser , who verbally acknowledged these results. Electronically Signed   By: Marlan Palau M.D.   On: 01/20/2018 12:46   US Carotid Bilateral (at [facility] And [facility] Only)  Result Date: 01/20/2018 CLINICAL DATA:  TIA. Hypertension, syncope, visual disturbance, hyperlipidemia, previous tobacco abuse.  EXAM: BILATERAL CAROTID DUPLEX ULTRASOUND TECHNIQUE: Wallace Cullens scale imaging, color Doppler and duplex ultrasound were performed of bilateral carotid and vertebral arteries in the neck. COMPARISON:  None. FINDINGS: Criteria: Quantification of carotid stenosis is based on velocity parameters that correlate the residual internal carotid diameter with NASCET-based stenosis levels, using the diameter of the distal internal carotid lumen as the denominator for  stenosis measurement. The following velocity measurements were obtained: RIGHT ICA: 546/227 cm/sec CCA: 48/12 cm/sec SYSTOLIC ICA/CCA RATIO:  11.4 ECA: 84 cm/sec LEFT ICA: 36/18 cm/sec CCA: 55/11 cm/sec SYSTOLIC ICA/CCA RATIO:  0.7 ECA: 182 cm/sec RIGHT CAROTID ARTERY: Scattered partially calcified plaque through the common carotid artery. Circumferential somewhat irregular and partially calcified plaque in the bulb extending into proximal internal and external carotid arteries. High-grade stenosis in the proximal ICA with markedly elevated peak systolic velocities, spectral broadening, in focal aliasing on color Doppler. Elsewhere, normal waveforms and color Doppler signal. RIGHT VERTEBRAL ARTERY:  Normal flow direction and waveform. LEFT CAROTID ARTERY: Mild smooth plaque in the common carotid artery. Eccentric partially calcified plaque in the bulb and bifurcation. The lumen of the ICAs poorly visualized with no normal waveform. Possible string sign noted on power Doppler in the distal ICA. LEFT VERTEBRAL ARTERY:  Normal flow direction and waveform. IMPRESSION: 1. Right carotid bifurcation plaque resulting in near occlusive proximal ICA stenosis. Recommend vascular surgical or neurointerventional radiology consultation. 2. 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 See above recommendation. 3.  Antegrade bilateral vertebral arterial flow. Electronically Signed   By: Corlis Leak M.D.   On:  01/20/2018 10:58   Dg Chest Port 1 View  Result Date: 01/19/2018 CLINICAL DATA:  Headache, hypertensive. EXAM: PORTABLE CHEST 1 VIEW COMPARISON:  Chest radiograph Jun 28, 2017 FINDINGS: Cardiomediastinal silhouette is normal. No pleural effusions or focal consolidations. Mild chronic bronchitic changes. Trachea projects midline and there is no pneumothorax. Soft tissue planes and included osseous structures are non-suspicious. Old RIGHT rib fractures. IMPRESSION: Mild chronic bronchitic changes. Electronically Signed   By: Awilda Metro M.D.   On: 01/19/2018 23:49   Mr Maxine Glenn Head Wo Contrast  Result Date: 01/20/2018 CLINICAL DATA:  TIA.  Hypertension and headache. EXAM: MR HEAD WITHOUT CONTRAST MR CIRCLE OF WILLIS WITHOUT CONTRAST MRA OF THE NECK WITHOUT AND WITH CONTRAST TECHNIQUE: Multiplanar, multiecho pulse sequences of the brain, circle of willis and surrounding structures were obtained without intravenous contrast. Angiographic images of the neck were obtained using MRA technique without and with intravenous contrast. 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 This extends posteriorly into the occipital lobe. No acute infarct in the left hemisphere. Negative for hemorrhage or mass. Ventricle size normal.  No midline shift. Vascular: Abnormal left cavernous carotid which is very small likely due to slow flow. Right cavernous carotid normal flow void. Remainder circle-of-Willis normal flow void. Skull and upper cervical spine: Negative Sinuses/Orbits: Retention cyst right maxillary sinus.  Normal orbit. Other: None MR CIRCLE OF WILLIS FINDINGS Minimal flow related signal in the left cervical carotid and left cavernous carotid due to severe proximal stenosis. Left cavernous carotid not well evaluated due to slow flow. Right cavernous carotid normal. Anterior and  middle cerebral arteries patent bilaterally. Mild stenosis A1 segment bilaterally. Moderate stenosis in the posterior division of the right middle cerebral artery. Both vertebral arteries patent to the basilar. PICA patent bilaterally. Decreased signal distal basilar appears to be artifact from motion. Fetal origin of the posterior cerebral artery bilaterally. Hypoplastic P1 segment bilaterally. Superior cerebellar artery patent bilaterally. MRA NECK FINDINGS Normal aortic arch. Standard three-vessel branching of the aortic arch. Mild stenosis proximal right common carotid artery which could be due to atherosclerotic disease or possibly artifact.  Greater than 60% diameter stenosis proximal right internal carotid artery which is then patent without additional stenosis. Critical stenosis at the origin of the left internal carotid artery. Faint flow related signal in the left cervical internal carotid artery Both vertebral arteries patent to the basilar. Decreased signal at the origin bilaterally could be due to moderate stenosis or artifact from motion IMPRESSION: 1. Acute infarct right frontal parietal lobe in a watershed territory distribution. No hemorrhage. 2. Greater than 60% diameter stenosis proximal right internal carotid artery. 3. Critical stenosis proximal left internal carotid artery with trickle flow to the skull base. 4. Mild stenosis A1 segment bilaterally. Moderate stenosis posterior division of the right middle cerebral artery. 5. Decreased signal at the origin of the vertebral artery which could be due to stenosis versus artifact. 6. These results were called by telephone at the time of interpretation on 01/20/2018 at 12:45 pm to Dr. Cherlynn KaiserSainani , who verbally acknowledged these results. Electronically Signed   By: Marlan Palauharles  Clark M.D.   On: 01/20/2018 12:46     ASSESSMENT AND PLAN:   52 year old male with past medical history of hyperlipidemia, tobacco abuse, essential hypertension but noncompliant  with meds who presented to the hospital due to headache, altered mental status and accelerated hypertension.  1.  Acute CVA-this is the cause of patient's headache/accelerated hypertension. - Patient's MRI is positive for acute infarct of the right frontal parietal lobe and watershed distribution. MRA of the neck is positive for high-grade carotid artery stenosis. - Continue aspirin 81 mg daily and Lipitor 40 mg at bedtime.  2.  Carotid artery stenosis-patient's carotid ultrasound and MRA of the neck is positive for critical left internal carotid artery stenosis with 60% stenosis of the right internal carotid artery.   Placement of a 10 x 8 exact stent by Dr. Gilda CreaseSchnier today.  3.  Accelerated hypertension- patient has a history of hypertension but is noncompliant with meds.  Tolerates some element of high blood pressure given his acute CVA. Continue lisinopril, Norvasc, PRN IV hydralazine.  4.  Hyperlipidemia-patient's total cholesterol is over 200, LDL 169. -Continue Lipitor 40 mg at bedtime.  5.  Tobacco abuse. Smoking cessation was counseled for 3-4 minutes by me.  The patient does not want to quit.  6. Depression - cont. Abilify.   All the records are reviewed and case discussed with Care Management/Social Worker. Management plans discussed with the patient, family and they are in agreement.  CODE STATUS: DNR  DVT Prophylaxis: Lovenox  TOTAL TIME TAKING CARE OF THIS PATIENT: 32 minutes.   POSSIBLE D/C IN 2 DAYS, DEPENDING ON CLINICAL CONDITION.   Shaune PollackQing Ileana Chalupa M.D on 01/21/2018 at 5:37 PM  Between 7am to 6pm - Pager - [phone]  After 6pm go to [email or web address] - Social research officer, governmentpassword [email or web address] [email or web address]  [facility] [place] [facility]  Office  [phone]  CC: Primary care physician; Patient, No Pcp Per

## 2018-01-21 NOTE — H&P (Signed)
[facility] History & Physical Update  The patient was interviewed and re-examined.  The patient's previous History and Physical has been reviewed and is unchanged.  There is no change in the plan of care. We plan to proceed with the scheduled procedure.  Levora DredgeGregory Schnier, MD  01/21/2018, 2:06 PM

## 2018-01-21 NOTE — Progress Notes (Signed)
*  PRELIMINARY RESULTS* Echocardiogram 2D Echocardiogram has been performed.  Cristela BlueHege, Edwinna Rochette 01/21/2018, 10:20 AM

## 2018-01-21 NOTE — Progress Notes (Signed)
Subjective: Patient denies any concerns today. Patient report  that headache and blurry vision improved. Pending surgery today for stenting.  Objective: Current vital signs: BP (!) 118/59 (BP Location: Left Arm)   Pulse (!) 52   Temp 98.1 F (36.7 C) (Oral)   Resp 16   Ht 5\' 5"  (1.651 m)   Wt 78.2 kg   SpO2 94%   BMI 28.71 kg/m  Vital signs in last 24 hours: Temp:  [97.6 F (36.4 C)-98.1 F (36.7 C)] 98.1 F (36.7 C) (12/04 0523) Pulse Rate:  [52-62] 52 (12/04 0523) Resp:  [16-18] 16 (12/04 0523) BP: (118-166)/(59-82) 118/59 (12/04 0523) SpO2:  [94 %-97 %] 94 % (12/04 0523)  Intake/Output from previous day: 12/03 0701 - 12/04 0700 In: 385.9 [I.V.:285.9; IV Piggyback:100] Out: -  Intake/Output this shift: No intake/output data recorded. Nutritional status:  Diet Order            Diet NPO time specified Except for: Sips with Meds  Diet effective midnight             Neurological Exam  Mental Status: Alert, oriented, appears agitated and irritable. Speech fluent without evidence of aphasia. Able to follow 3 step commands without difficulty. Attention span and concentration seemed appropriate  Cranial Nerves: II: Discs flat bilaterally; Visual fields grossly normal, pupils equal, round, reactive to light and accommodation III,IV, VI: ptosis not present, extra-ocular motions intact bilaterally V,VII: smile symmetric, facial light touch sensationintact VIII: hearing normal bilaterally IX,X: gag reflex present XI: bilateral shoulder shrug XII: midline tongue extension Motor: Right :Upper extremity 5/5Without pronator driftLeft: Upper extremity 5/5 without pronator drift Right:Lower extremity 5/5Left: Lower extremity 5/5 Tone and bulk:normal tone throughout; no atrophy noted Sensory: Pinprick and light touchintact bilaterally Deep Tendon Reflexes: 2+ and symmetric throughout Plantars: Right:  downgoingLeft: downgoing Cerebellar: Finger-to-nosetesting intact bilaterally.Heel to shin testing normal bilaterally Gait: not tested due to safety concerns  Data Reviewed  Lab Results: Basic Metabolic Panel: Recent Labs  Lab 01/19/18 2347 01/21/18 0522  NA 143 142  K 3.9 3.6  CL 106 108  CO2 24 26  GLUCOSE 107* 90  BUN 15 15  CREATININE 0.88 0.86  CALCIUM 9.7 9.1  MG  --  2.0   Liver Function Tests: No results for input(s): AST, ALT, ALKPHOS, BILITOT, PROT, ALBUMIN in the last 168 hours. No results for input(s): LIPASE, AMYLASE in the last 168 hours. No results for input(s): AMMONIA in the last 168 hours.  CBC: Recent Labs  Lab 01/19/18 2347 01/21/18 0522  WBC 12.8* 8.1  HGB 15.3 13.9  HCT 45.2 41.7  MCV 93.6 94.8  PLT 299 278    Cardiac Enzymes: Recent Labs  Lab 01/19/18 2347  TROPONINI <0.03    Lipid Panel: Recent Labs  Lab 01/19/18 2347  CHOL 266*  TRIG 222*  HDL 53  CHOLHDL 5.0  VLDL 44*  LDLCALC 169*    CBG: No results for input(s): GLUCAP in the last 168 hours.  Microbiology: No results found for this or any previous visit.  Coagulation Studies: Recent Labs    01/21/18 0522  LABPROT 12.7  INR 0.96    Imaging: Ct Angio Head W Or Wo Contrast  Result Date: 01/20/2018 CLINICAL DATA:  Headache and difficulty concentrating for 2 days. Hypertensive. EXAM: CT ANGIOGRAPHY HEAD TECHNIQUE: Multidetector CT imaging of the head was performed using the standard protocol during bolus administration of intravenous contrast. Multiplanar CT image reconstructions and MIPs were obtained to evaluate the vascular anatomy.  CONTRAST:  75mL OMNIPAQUE IOHEXOL 350 MG/ML SOLN COMPARISON:  None. FINDINGS: CT HEAD BRAIN: No intraparenchymal hemorrhage, mass effect nor midline shift. Linear RIGHT frontal and RIGHT parietal encephalomalacia. Smaller RIGHT inferior frontal lobe encephalomalacia. The ventricles and sulci are normal. No  acute large vascular territory infarcts. No abnormal extra-axial fluid collections. Basal cisterns are patent. VASCULAR: Unremarkable. SKULL/SOFT TISSUES: No skull fracture. No significant soft tissue swelling. ORBITS/SINUSES: The included ocular globes and orbital contents are normal.Trace paranasal sinus mucosal thickening. Mastoid air cells are well aerated. OTHER: None. CTA HEAD ANTERIOR CIRCULATION: Diminutive LEFT petrous ICA, tine versus occluded in the cervical portion. RIGHT ICA is dominant. No large vessel occlusion, significant stenosis, contrast extravasation or aneurysm. POSTERIOR CIRCULATION: Patent vertebral arteries, vertebrobasilar junction and basilar artery, as well as main branch vessels. Patent posterior cerebral arteries. Robust LEFT posterior communicating artery present. Fetal origin LEFT PCA. No large vessel occlusion, significant stenosis, contrast extravasation or aneurysm. VENOUS SINUSES: Major dural venous sinuses are patent though not tailored for evaluation on this angiographic examination. ANATOMIC VARIANTS: None. DELAYED PHASE: Not performed. MIP images reviewed. IMPRESSION: CT HEAD: 1. No acute intracranial process. 2. 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 Findings may be better demonstrated on non emergent MRI head. CTA HEAD: 1. No emergent large vessel occlusion or flow-limiting stenosis. 2. Asymmetrically smaller LEFT ICA, potentially occluded in the neck. Recommend CTA versus MRA neck on non emergent basis. Electronically Signed   By: Awilda Metro M.D.   On: 01/20/2018 02:15   Mr Maxine Glenn Neck W Wo Contrast  Result Date: 01/20/2018 CLINICAL DATA:  TIA.  Hypertension and headache. EXAM: MR HEAD WITHOUT CONTRAST MR CIRCLE OF WILLIS WITHOUT CONTRAST MRA OF THE NECK WITHOUT AND WITH CONTRAST TECHNIQUE: Multiplanar, multiecho pulse sequences of the brain, circle of willis and surrounding structures were obtained without intravenous  contrast. Angiographic images of the neck were obtained using MRA technique without and with intravenous contrast. 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 This extends posteriorly into the occipital lobe. No acute infarct in the left hemisphere. Negative for hemorrhage or mass. Ventricle size normal.  No midline shift. Vascular: Abnormal left cavernous carotid which is very small likely due to slow flow. Right cavernous carotid normal flow void. Remainder circle-of-Willis normal flow void. Skull and upper cervical spine: Negative Sinuses/Orbits: Retention cyst right maxillary sinus.  Normal orbit. Other: None MR CIRCLE OF WILLIS FINDINGS Minimal flow related signal in the left cervical carotid and left cavernous carotid due to severe proximal stenosis. Left cavernous carotid not well evaluated due to slow flow. Right cavernous carotid normal. Anterior and middle cerebral arteries patent bilaterally. Mild stenosis A1 segment bilaterally. Moderate stenosis in the posterior division of the right middle cerebral artery. Both vertebral arteries patent to the basilar. PICA patent bilaterally. Decreased signal distal basilar appears to be artifact from motion. Fetal origin of the posterior cerebral artery bilaterally. Hypoplastic P1 segment bilaterally. Superior cerebellar artery patent bilaterally. MRA NECK FINDINGS Normal aortic arch. Standard three-vessel branching of the aortic arch. Mild stenosis proximal right common carotid artery which could be due to atherosclerotic disease or possibly artifact. Greater than 60% diameter stenosis proximal right internal carotid artery which is then patent without additional stenosis. Critical stenosis at the origin of the left internal carotid artery. Faint flow related signal in the left cervical internal carotid artery  Both vertebral  arteries patent to the basilar. Decreased signal at the origin bilaterally could be due to moderate stenosis or artifact from motion IMPRESSION: 1. Acute infarct right frontal parietal lobe in a watershed territory distribution. No hemorrhage. 2. Greater than 60% diameter stenosis proximal right internal carotid artery. 3. Critical stenosis proximal left internal carotid artery with trickle flow to the skull base. 4. Mild stenosis A1 segment bilaterally. Moderate stenosis posterior division of the right middle cerebral artery. 5. Decreased signal at the origin of the vertebral artery which could be due to stenosis versus artifact. 6. These results were called by telephone at the time of interpretation on 01/20/2018 at 12:45 pm to Dr. Cherlynn Kaiser , who verbally acknowledged these results. Electronically Signed   By: Marlan Palau M.D.   On: 01/20/2018 12:46   Mr Brain Wo Contrast  Result Date: 01/20/2018 CLINICAL DATA:  TIA.  Hypertension and headache. EXAM: MR HEAD WITHOUT CONTRAST MR CIRCLE OF WILLIS WITHOUT CONTRAST MRA OF THE NECK WITHOUT AND WITH CONTRAST TECHNIQUE: Multiplanar, multiecho pulse sequences of the brain, circle of willis and surrounding structures were obtained without intravenous contrast. Angiographic images of the neck were obtained using MRA technique without and with intravenous contrast. 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 This extends posteriorly into the occipital lobe. No acute infarct in the left hemisphere. Negative for hemorrhage or mass. Ventricle size normal.  No midline shift. Vascular: Abnormal left cavernous carotid which is very small likely due to slow flow. Right cavernous carotid normal flow void. Remainder circle-of-Willis normal flow void. Skull and upper cervical spine: Negative Sinuses/Orbits: Retention cyst right maxillary  sinus.  Normal orbit. Other: None MR CIRCLE OF WILLIS FINDINGS Minimal flow related signal in the left cervical carotid and left cavernous carotid due to severe proximal stenosis. Left cavernous carotid not well evaluated due to slow flow. Right cavernous carotid normal. Anterior and middle cerebral arteries patent bilaterally. Mild stenosis A1 segment bilaterally. Moderate stenosis in the posterior division of the right middle cerebral artery. Both vertebral arteries patent to the basilar. PICA patent bilaterally. Decreased signal distal basilar appears to be artifact from motion. Fetal origin of the posterior cerebral artery bilaterally. Hypoplastic P1 segment bilaterally. Superior cerebellar artery patent bilaterally. MRA NECK FINDINGS Normal aortic arch. Standard three-vessel branching of the aortic arch. Mild stenosis proximal right common carotid artery which could be due to atherosclerotic disease or possibly artifact. Greater than 60% diameter stenosis proximal right internal carotid artery which is then patent without additional stenosis. Critical stenosis at the origin of the left internal carotid artery. Faint flow related signal in the left cervical internal carotid artery Both vertebral arteries patent to the basilar. Decreased signal at the origin bilaterally could be due to moderate stenosis or artifact from motion IMPRESSION: 1. Acute infarct right frontal parietal lobe in a watershed territory distribution. No hemorrhage. 2. Greater than 60% diameter stenosis proximal right internal carotid artery. 3. Critical stenosis proximal left internal carotid artery with trickle flow to the skull base. 4. Mild stenosis A1 segment bilaterally. Moderate stenosis posterior division of the right middle cerebral artery. 5. Decreased signal at the origin of the vertebral artery which could be due to stenosis versus artifact. 6. These results were called by telephone at the time of interpretation on 01/20/2018 at  12:45 pm to Dr. Cherlynn Kaiser , who verbally acknowledged these  results. Electronically Signed   By: Marlan Palau M.D.   On: 01/20/2018 12:46   US Carotid Bilateral (at [facility] And [facility] Only)  Result Date: 01/20/2018 CLINICAL DATA:  TIA. Hypertension, syncope, visual disturbance, hyperlipidemia, previous tobacco abuse. EXAM: BILATERAL CAROTID DUPLEX ULTRASOUND TECHNIQUE: Wallace Cullens scale imaging, color Doppler and duplex ultrasound were performed of bilateral carotid and vertebral arteries in the neck. COMPARISON:  None. FINDINGS: Criteria: Quantification of carotid stenosis is based on velocity parameters that correlate the residual internal carotid diameter with NASCET-based stenosis levels, using the diameter of the distal internal carotid lumen as the denominator for stenosis measurement. The following velocity measurements were obtained: RIGHT ICA: 546/227 cm/sec CCA: 48/12 cm/sec SYSTOLIC ICA/CCA RATIO:  11.4 ECA: 84 cm/sec LEFT ICA: 36/18 cm/sec CCA: 55/11 cm/sec SYSTOLIC ICA/CCA RATIO:  0.7 ECA: 182 cm/sec RIGHT CAROTID ARTERY: Scattered partially calcified plaque through the common carotid artery. Circumferential somewhat irregular and partially calcified plaque in the bulb extending into proximal internal and external carotid arteries. High-grade stenosis in the proximal ICA with markedly elevated peak systolic velocities, spectral broadening, in focal aliasing on color Doppler. Elsewhere, normal waveforms and color Doppler signal. RIGHT VERTEBRAL ARTERY:  Normal flow direction and waveform. LEFT CAROTID ARTERY: Mild smooth plaque in the common carotid artery. Eccentric partially calcified plaque in the bulb and bifurcation. The lumen of the ICAs poorly visualized with no normal waveform. Possible string sign noted on power Doppler in the distal ICA. LEFT VERTEBRAL ARTERY:  Normal flow direction and waveform. IMPRESSION: 1. Right carotid bifurcation plaque resulting in near occlusive proximal ICA stenosis. Recommend  vascular surgical or neurointerventional radiology consultation. 2. 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 See above recommendation. 3.  Antegrade bilateral vertebral arterial flow. Electronically Signed   By: Corlis Leak M.D.   On: 01/20/2018 10:58   Dg Chest Port 1 View  Result Date: 01/19/2018 CLINICAL DATA:  Headache, hypertensive. EXAM: PORTABLE CHEST 1 VIEW COMPARISON:  Chest radiograph Jun 28, 2017 FINDINGS: Cardiomediastinal silhouette is normal. No pleural effusions or focal consolidations. Mild chronic bronchitic changes. Trachea projects midline and there is no pneumothorax. Soft tissue planes and included osseous structures are non-suspicious. Old RIGHT rib fractures. IMPRESSION: Mild chronic bronchitic changes. Electronically Signed   By: Awilda Metro M.D.   On: 01/19/2018 23:49   Mr Maxine Glenn Head Wo Contrast  Result Date: 01/20/2018 CLINICAL DATA:  TIA.  Hypertension and headache. EXAM: MR HEAD WITHOUT CONTRAST MR CIRCLE OF WILLIS WITHOUT CONTRAST MRA OF THE NECK WITHOUT AND WITH CONTRAST TECHNIQUE: Multiplanar, multiecho pulse sequences of the brain, circle of willis and surrounding structures were obtained without intravenous contrast. Angiographic images of the neck were obtained using MRA technique without and with intravenous contrast. 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 This extends posteriorly into the occipital lobe. No acute infarct in the left hemisphere. Negative for hemorrhage or mass. Ventricle size normal.  No midline shift. Vascular: Abnormal left cavernous carotid which is very small likely due to slow flow. Right cavernous carotid normal flow void. Remainder circle-of-Willis normal flow void. Skull and upper cervical spine: Negative Sinuses/Orbits:  Retention cyst right maxillary sinus.  Normal orbit. Other: None MR CIRCLE OF WILLIS FINDINGS Minimal flow related signal in the left cervical carotid and left cavernous carotid due to severe proximal stenosis. Left  cavernous carotid not well evaluated due to slow flow. Right cavernous carotid normal. Anterior and middle cerebral arteries patent bilaterally. Mild stenosis A1 segment bilaterally. Moderate stenosis in the posterior division of the right middle cerebral artery. Both vertebral arteries patent to the basilar. PICA patent bilaterally. Decreased signal distal basilar appears to be artifact from motion. Fetal origin of the posterior cerebral artery bilaterally. Hypoplastic P1 segment bilaterally. Superior cerebellar artery patent bilaterally. MRA NECK FINDINGS Normal aortic arch. Standard three-vessel branching of the aortic arch. Mild stenosis proximal right common carotid artery which could be due to atherosclerotic disease or possibly artifact. Greater than 60% diameter stenosis proximal right internal carotid artery which is then patent without additional stenosis. Critical stenosis at the origin of the left internal carotid artery. Faint flow related signal in the left cervical internal carotid artery Both vertebral arteries patent to the basilar. Decreased signal at the origin bilaterally could be due to moderate stenosis or artifact from motion IMPRESSION: 1. Acute infarct right frontal parietal lobe in a watershed territory distribution. No hemorrhage. 2. Greater than 60% diameter stenosis proximal right internal carotid artery. 3. Critical stenosis proximal left internal carotid artery with trickle flow to the skull base. 4. Mild stenosis A1 segment bilaterally. Moderate stenosis posterior division of the right middle cerebral artery. 5. Decreased signal at the origin of the vertebral artery which could be due to stenosis versus artifact. 6. These results were called by telephone at the time of  interpretation on 01/20/2018 at 12:45 pm to Dr. Cherlynn Kaiser , who verbally acknowledged these results. Electronically Signed   By: Marlan Palau M.D.   On: 01/20/2018 12:46    Medications:  I have reviewed the patient's current medications. Prior to Admission:  Medications Prior to Admission  Medication Sig Dispense Refill Last Dose  . ARIPiprazole (ABILIFY) 10 MG tablet Take 10 mg by mouth daily. Take 0.5 tablet for 7 day, then increase to 1 tablet for 3 weeks. Pt is now on 1 tablet last fill on 11-06-17  0 11/06/2017 at unknown  . loratadine (CLARITIN) 10 MG tablet Take 10 mg by mouth daily.  0 01/19/2018 at Unknown time  . lovastatin (MEVACOR) 10 MG tablet Take 10 mg by mouth daily.   0 01/19/2018 at Unknown time  . omeprazole (PRILOSEC) 20 MG capsule Take 20 mg by mouth 2 (two) times daily.  0 01/19/2018 at Unknown time  . lamoTRIgine (LAMICTAL) 150 MG tablet Take 150 mg by mouth daily.   Not Taking at unknown   Scheduled: .  stroke: mapping our early stages of recovery book   Does not apply Once  . amLODipine  5 mg Oral Daily  . ARIPiprazole  10 mg Oral Daily  . aspirin  300 mg Rectal Daily   Or  . aspirin  325 mg Oral Daily  . atorvastatin  40 mg Oral q1800  . docusate sodium  100 mg Oral BID  . enoxaparin (LOVENOX) injection  40 mg Subcutaneous Q24H  . lisinopril  10 mg Oral Daily  . loratadine  10 mg Oral Daily  . pantoprazole  40 mg Oral Daily   Assessment: 52 y.o. male  with past medical history of bipolar disorder, anxiety,  GERD, hyperlipidemia, hypertension, polysubstance abuse, and tobacco abuse presenting with complaints of elevated blood pressure, headache and confusion. Found to have acute infarct in the right frontoparietal lobe in a watershed territory distribution.  Etiology likely small vessel disease.  CTA head showed no emergent large vessel occlusion  or flow-limiting stenosis.  Ultrasound carotid showed right carotid bifurcation plaque resulting in near occlusive proximal  ICA stenosis, extensive occlusive disease of the left ICA with possible string sign, no normal arterial flow signal.  Vascular following for possible intervention today. Hemoglobin A1c 5.9, LDL 169.  Patient states he was not on any antiplatelet or anticoagulation prior to this event  Plan: 1. Aspirin 81 mg/day  2. Allow permissive hypertension in the setting of watershed infarct with future goal to keep systolic BP (SBP) <140 mm Hg (454130 mm Hg if diabetic) 3. Statin with goal low density lipoprotein (LDL) <70 mg/dl 4. Will give one time dose of Depakote 1000 mg IV once to run over 30 minutes, then restart his home Lamictal for mood stabilization 5. PT consult, OT consult, Speech consult 6. Echocardiogram pending 7. Smoking cessation and polysubstance abuse counseling 8. Telemetry monitoring 10. Frequent neuro checks  This patient was staffed with Dr. Jonna Munroarek Dakakni  who personally evaluated patient, reviewed documentation and agreed with assessment and plan of care as above.  Webb SilversmithElizabeth Ouma, DNP, FNP-BC Board certified Nurse Practitioner Neurology Department   LOS: 0 days   01/21/2018  10:37 AM

## 2018-01-22 ENCOUNTER — Telehealth (INDEPENDENT_AMBULATORY_CARE_PROVIDER_SITE_OTHER): Payer: Self-pay | Admitting: Nurse Practitioner

## 2018-01-22 ENCOUNTER — Encounter: Payer: Self-pay | Admitting: Vascular Surgery

## 2018-01-22 LAB — BASIC METABOLIC PANEL
Anion gap: 8 (ref 5–15)
BUN: 18 mg/dL (ref 6–20)
CO2: 24 mmol/L (ref 22–32)
Calcium: 8.3 mg/dL — ABNORMAL LOW (ref 8.9–10.3)
Chloride: 109 mmol/L (ref 98–111)
Creatinine, Ser: 0.79 mg/dL (ref 0.61–1.24)
GFR calc Af Amer: >60 mL/min (ref >60)
Glucose, Bld: 93 mg/dL (ref 70–99)
Potassium: 3.6 mmol/L (ref 3.5–5.1)
SODIUM: 141 mmol/L (ref 135–145)

## 2018-01-22 LAB — CBC
HCT: 37.4 % — ABNORMAL LOW (ref 39.0–52.0)
Hemoglobin: 12.3 g/dL — ABNORMAL LOW (ref 13.0–17.0)
MCH: 31.5 pg (ref 26.0–34.0)
MCHC: 32.9 g/dL (ref 30.0–36.0)
MCV: 95.7 fL (ref 80.0–100.0)
Platelets: 259 10*3/uL (ref 150–400)
RBC: 3.91 MIL/uL — ABNORMAL LOW (ref 4.22–5.81)
RDW: 13.5 % (ref 11.5–15.5)
WBC: 8.4 10*3/uL (ref 4.0–10.5)
nRBC: 0 % (ref 0.0–0.2)

## 2018-01-22 MED ORDER — CLOPIDOGREL BISULFATE 75 MG PO TABS: 75.0000 mg | ORAL_TABLET | Freq: Every day | ORAL | 2 refills | Status: DC

## 2018-01-22 MED ORDER — ASPIRIN EC 81 MG PO TBEC: 81.0000 mg | DELAYED_RELEASE_TABLET | Freq: Every day | ORAL | 2 refills | Status: AC

## 2018-01-22 MED ORDER — AMLODIPINE BESYLATE 5 MG PO TABS: 5.0000 mg | ORAL_TABLET | Freq: Every day | ORAL | 1 refills | Status: DC

## 2018-01-22 MED ORDER — ATORVASTATIN CALCIUM 40 MG PO TABS: 40.0000 mg | ORAL_TABLET | Freq: Every day | ORAL | 2 refills | Status: DC

## 2018-01-22 NOTE — Plan of Care (Signed)
Pt DCd home, BP meds held per MD, pt will f/u OP with psychiatry re his depression, vasc site w/old drainage, IV sites DCd, bleeding controlled.  F/u appt made and DC instructions and scrips given, understanding verbalized, he will try to quit smoking

## 2018-01-22 NOTE — Discharge Instructions (Signed)
You may shower. Do not bath or submerge in water until cleared by your physician. Remove groin dressing. Keep groins clean and dry. No dressing needed.  No driving while on pain medication. No driving until your first follow up in our office.  Smoking cessation. Diet control and exercise.

## 2018-01-22 NOTE — Progress Notes (Signed)
[facility]  Daily Progress Note   Subjective  - 1 Day Post-Op  Patient notes a little soreness in his right groin otherwise he has no complaints.  Is eating his regular breakfast without difficulty.  Objective Vitals:   01/22/18 0200 01/22/18 0300 01/22/18 0400 01/22/18 0500  BP: (!) 114/59 (!) 96/53 (!) 97/53 106/60  Pulse: (!) 47 (!) 49 (!) 51 (!) 49  Resp: 17 13 14 16   Temp:   98.5 F (36.9 C)   TempSrc:   Oral   SpO2: 95% 95% 93% 94%  Weight:      Height:        Intake/Output Summary (Last 24 hours) at 01/22/2018 0804 Last data filed at 01/22/2018 0500 Gross per 24 hour  Intake 1765.06 ml  Output 550 ml  Net 1215.06 ml    PULM  Normal effort , no use of accessory muscles CV  No JVD, RRR Abd      No distended, nontender VASC  groin clean dry and intact Neuro moves all extremities with 5 out of 5 strength.  Speech is clear.  Laboratory CBC    Component Value Date/Time   WBC 8.4 01/22/2018 0518   HGB 12.3 (L) 01/22/2018 0518   HCT 37.4 (L) 01/22/2018 0518   PLT 259 01/22/2018 0518    BMET    Component Value Date/Time   NA 141 01/22/2018 0518   K 3.6 01/22/2018 0518   CL 109 01/22/2018 0518   CO2 24 01/22/2018 0518   GLUCOSE 93 01/22/2018 0518   BUN 18 01/22/2018 0518   CREATININE 0.79 01/22/2018 0518   CALCIUM 8.3 (L) 01/22/2018 0518   GFRNONAA >60 01/22/2018 0518   GFRAA >60 01/22/2018 0518    Assessment/Planning: POD #1 s/p right internal carotid artery stent   Patient okay for discharge.  He can resume walking.  I requested he remain off of work for 1 week.  He should take Plavix and aspirin.  He will follow-up with me in 2 weeks with a duplex ultrasound of the carotid    Levora DredgeGregory Vaughn Frieze  01/22/2018, 8:04 AM

## 2018-01-22 NOTE — Discharge Summary (Signed)
[facility] [place] [facility]   PATIENT NAME: Timothy Pham    MR#:  [id number]  DATE OF BIRTH:  1965-09-07  DATE OF ADMISSION:  01/19/2018   ADMITTING PHYSICIAN: Arnaldo Natal, MD  DATE OF DISCHARGE: 01/22/2018 11:53 AM  PRIMARY CARE PHYSICIAN: Patient, No Pcp Per   ADMISSION DIAGNOSIS:  TIA (transient ischemic attack) [G45.9] DISCHARGE DIAGNOSIS:  Active Problems:   Headache  SECONDARY DIAGNOSIS:   Past Medical History:  Diagnosis Date  . Anxiety   . Bipolar 1 disorder (HCC)   . GERD (gastroesophageal reflux disease)   . Hyperlipidemia   . Hypertension   . Nephrolithiasis    HOSPITAL COURSE:  52 year old male with past medical history of hyperlipidemia, tobacco abuse, essential hypertension but noncompliant with meds who presented to the hospital due to headache, altered mental status and accelerated hypertension.  1.  Acute CVA-this is the cause of patient's headache/accelerated hypertension. - Patient's MRI is positive for acute infarct of the right frontal parietal lobe and watershed distribution. MRA of the neck is positive for high-grade carotid artery stenosis. - Continue aspirin, plavix, and Lipitor.  2.  Carotid artery stenosis-patient's carotid ultrasound and MRA of the neck is positive for critical left internal carotid artery stenosis with 60% stenosis of the right internal carotid artery.   Placement of a 10 x 8 exact stent by Dr. Gilda Crease, added plavix by Dr. Gilda Crease.  3.  Accelerated hypertension- patient has a history of hypertension but is noncompliant with meds.  Tolerates some element of high blood pressure given his acute CVA. He is treated with lisinopril, Norvasc, PRN IV hydralazine. No need for HTN meds due to low side BP and acute CVA.  4.  Hyperlipidemia-patient's total cholesterol is over 200, LDL 169. -Continue Lipitor 40 mg at bedtime.  5.  Tobacco abuse. Smoking cessation was counseled for 3-4 minutes by  me.  The patient does not want to quit.  6. Depression - cont. Abilify.   DISCHARGE CONDITIONS:  Stable, discharged to home today. CONSULTS OBTAINED:  Treatment Team:  Schnier, Latina Craver, MD Kym Groom, MD DRUG ALLERGIES:   Allergies  Allergen Reactions  . Wellbutrin [Bupropion] Itching   DISCHARGE MEDICATIONS:   Allergies as of 01/22/2018      Reactions   Wellbutrin [bupropion] Itching      Medication List    STOP taking these medications   lovastatin 10 MG tablet Commonly known as:  MEVACOR     TAKE these medications   ARIPiprazole 10 MG tablet Commonly known as:  ABILIFY Take 10 mg by mouth daily. Take 0.5 tablet for 7 day, then increase to 1 tablet for 3 weeks. Pt is now on 1 tablet last fill on 11-06-17   aspirin EC 81 MG tablet Take 1 tablet (81 mg total) by mouth daily.   atorvastatin 40 MG tablet Commonly known as:  LIPITOR Take 1 tablet (40 mg total) by mouth daily at 6 PM.   clopidogrel 75 MG tablet Commonly known as:  PLAVIX Take 1 tablet (75 mg total) by mouth daily.   lamoTRIgine 150 MG tablet Commonly known as:  LAMICTAL Take 150 mg by mouth daily.   loratadine 10 MG tablet Commonly known as:  CLARITIN Take 10 mg by mouth daily.   omeprazole 20 MG capsule Commonly known as:  PRILOSEC Take 20 mg by mouth 2 (two) times daily.        DISCHARGE INSTRUCTIONS:  See AVS.  If you experience  worsening of your admission symptoms, develop shortness of breath, life threatening emergency, suicidal or homicidal thoughts you must seek medical attention immediately by calling 911 or calling your MD immediately  if symptoms less severe.  You Must read complete instructions/literature along with all the possible adverse reactions/side effects for all the Medicines you take and that have been prescribed to you. Take any new Medicines after you have completely understood and accpet all the possible adverse reactions/side effects.   Please  note  You were cared for by a hospitalist during your hospital stay. If you have any questions about your discharge medications or the care you received while you were in the hospital after you are discharged, you can call the unit and asked to speak with the hospitalist on call if the hospitalist that took care of you is not available. Once you are discharged, your primary care physician will handle any further medical issues. Please note that NO REFILLS for any discharge medications will be authorized once you are discharged, as it is imperative that you return to your primary care physician (or establish a relationship with a primary care physician if you do not have one) for your aftercare needs so that they can reassess your need for medications and monitor your lab values.    On the day of Discharge:  VITAL SIGNS:  Blood pressure (!) 142/77, pulse (!) 56, temperature 98 F (36.7 C), temperature source Oral, resp. rate 14, height 5\' 5"  (1.651 m), weight 75.7 kg, SpO2 93 %. PHYSICAL EXAMINATION:  GENERAL:  52 y.o.-year-old patient lying in the bed with no acute distress.  EYES: Pupils equal, round, reactive to light and accommodation. No scleral icterus. Extraocular muscles intact.  HEENT: Head atraumatic, normocephalic. Oropharynx and nasopharynx clear.  NECK:  Supple, no jugular venous distention. No thyroid enlargement, no tenderness.  LUNGS: Normal breath sounds bilaterally, no wheezing, rales,rhonchi or crepitation. No use of accessory muscles of respiration.  CARDIOVASCULAR: S1, S2 normal. No murmurs, rubs, or gallops.  ABDOMEN: Soft, non-tender, non-distended. Bowel sounds present. No organomegaly or mass.  EXTREMITIES: No pedal edema, cyanosis, or clubbing.  NEUROLOGIC: Cranial nerves II through XII are intact. Muscle strength 5/5 in all extremities. Sensation intact. Gait not checked.  PSYCHIATRIC: The patient is alert and oriented x 3.  SKIN: No obvious rash, lesion, or ulcer.   DATA REVIEW:   CBC Recent Labs  Lab 01/22/18 0518  WBC 8.4  HGB 12.3*  HCT 37.4*  PLT 259    Chemistries  Recent Labs  Lab 01/21/18 0522 01/22/18 0518  NA 142 141  K 3.6 3.6  CL 108 109  CO2 26 24  GLUCOSE 90 93  BUN 15 18  CREATININE 0.86 0.79  CALCIUM 9.1 8.3*  MG 2.0  --      Microbiology Results  Results for orders placed or performed during the hospital encounter of 01/19/18  MRSA PCR Screening     Status: None   Collection Time: 01/21/18  6:28 PM  Result Value Ref Range Status   MRSA by PCR NEGATIVE NEGATIVE Final    Comment:        The GeneXpert MRSA Assay (FDA approved for NASAL specimens only), is one component of a comprehensive MRSA colonization surveillance program. It is not intended to diagnose MRSA infection nor to guide or monitor treatment for MRSA infections. Performed at [facility], [address], [place], [place] [address]     RADIOLOGY:  No results found.   Management  plans discussed with the patient, family and they are in agreement.  CODE STATUS: DNR   TOTAL TIME TAKING CARE OF THIS PATIENT: 32 minutes.    Shaune PollackQing Kesean Serviss M.D on 01/22/2018 at 2:04 PM  Between 7am to 6pm - Pager - [phone]  After 6pm go to [email or web address] - Social research officer, [email or web address] [email or web address]  [facility]  Office  [phone]  CC: Primary care physician; Patient, No Pcp Per   Note: This dictation was prepared with Dragon dictation along with smaller phrase technology. Any transcriptional errors that result from this process are unintentional.

## 2018-01-23 LAB — POCT ACTIVATED CLOTTING TIME: Activated Clotting Time: 263 seconds

## 2018-01-28 ENCOUNTER — Ambulatory Visit (INDEPENDENT_AMBULATORY_CARE_PROVIDER_SITE_OTHER): Payer: BLUE CROSS/BLUE SHIELD | Admitting: Nurse Practitioner

## 2018-01-28 ENCOUNTER — Encounter (INDEPENDENT_AMBULATORY_CARE_PROVIDER_SITE_OTHER): Payer: Self-pay | Admitting: Nurse Practitioner

## 2018-01-28 VITALS — BP 114/73 | HR 60 | Resp 18 | Ht 65.0 in | Wt 165.0 lb

## 2018-01-28 DIAGNOSIS — K219 Gastro-esophageal reflux disease without esophagitis: Secondary | ICD-10-CM | POA: Insufficient documentation

## 2018-01-28 DIAGNOSIS — Z7982 Long term (current) use of aspirin: Secondary | ICD-10-CM

## 2018-01-28 DIAGNOSIS — Z0289 Encounter for other administrative examinations: Secondary | ICD-10-CM

## 2018-01-28 DIAGNOSIS — F17210 Nicotine dependence, cigarettes, uncomplicated: Secondary | ICD-10-CM

## 2018-01-28 DIAGNOSIS — I63239 Cerebral infarction due to unspecified occlusion or stenosis of unspecified carotid arteries: Secondary | ICD-10-CM | POA: Insufficient documentation

## 2018-01-28 DIAGNOSIS — E785 Hyperlipidemia, unspecified: Secondary | ICD-10-CM | POA: Insufficient documentation

## 2018-01-28 NOTE — Progress Notes (Signed)
Subjective:    Patient ID: Timothy Pham, male    DOB: [birth year], 52 y.o.   MRN: [id number] Chief Complaint  Patient presents with  . Follow-up    [facility] f/u    HPI  Timothy Pham is a 52 y.o. male The patient is seen for follow up evaluation of carotid stenosis status post right internal carotid artery stent on 01/21/2017.  There were no post operative problems or complications related to the surgery.  The patient denies neck or incisional pain.  The patient denies interval amaurosis fugax.  Patient was recently found to have TIA-like symptoms.  There is prior evidence of old CVA on CT scan.  CT scan also revealed occlusion of the left internal carotid artery at its cervical portion, preintervention there was a greater than 90% occlusion of the right internal carotid artery.  Patient is a current smoker.  The patient denies headache.  The patient is taking enteric-coated aspirin 81 mg daily.  The patient has a history of coronary artery disease, no recent episodes of angina or shortness of breath. The patient denies PAD or claudication symptoms. There is a history of hyperlipidemia which is being treated with a statin.    Past Medical History:  Diagnosis Date  . Anxiety   . Bipolar 1 disorder (HCC)   . GERD (gastroesophageal reflux disease)   . Hyperlipidemia   . Hypertension   . Nephrolithiasis   . Stroke [facility])    December 2019    Past Surgical History:  Procedure Laterality Date  . CAROTID ANGIOGRAPHY Bilateral 01/21/2018   Procedure: CAROTID ANGIOGRAPHY;  Surgeon: Renford Dills, MD;  Location: [facility] INVASIVE CV LAB;  Service: Cardiovascular;  Laterality: Bilateral;    Social History   Socioeconomic History  . Marital status: Single    Spouse name: Not on file  . Number of children: Not on file  . Years of education: Not on file  . Highest education level: Not on file  Occupational History  . Not on file  Social Needs  . Financial resource strain: Not on  file  . Food insecurity:    Worry: Not on file    Inability: Not on file  . Transportation needs:    Medical: Not on file    Non-medical: Not on file  Tobacco Use  . Smoking status: Current Every Day Smoker    Packs/day: 1.00    Types: Cigarettes  . Smokeless tobacco: Never Used  Substance and Sexual Activity  . Alcohol use: No  . Drug use: No  . Sexual activity: Not on file  Lifestyle  . Physical activity:    Days per week: Not on file    Minutes per session: Not on file  . Stress: Not on file  Relationships  . Social connections:    Talks on phone: Not on file    Gets together: Not on file    Attends religious service: Not on file    Active member of club or organization: Not on file    Attends meetings of clubs or organizations: Not on file    Relationship status: Not on file  . Intimate partner violence:    Fear of current or ex partner: Not on file    Emotionally abused: Not on file    Physically abused: Not on file    Forced sexual activity: Not on file  Other Topics Concern  . Not on file  Social History Narrative  . Not on file  Family History  Problem Relation Age of Onset  . Heart disease Mother   . Bipolar disorder Mother   . Heart disease Father   . Diabetes Mellitus II Father   . Obesity Sister   . Heart disease Brother     Allergies  Allergen Reactions  . Wellbutrin [Bupropion] Itching     Review of Systems   Review of Systems: Negative Unless Checked Constitutional: [] Weight loss  [] Fever  [] Chills Cardiac: [] Chest pain   []  Atrial Fibrillation  [] Palpitations   [] Shortness of breath when laying flat   [] Shortness of breath with exertion. Vascular:  [] Pain in legs with walking   [] Pain in legs with standing  [] History of DVT   [] Phlebitis   [] Swelling in legs   [] Varicose veins   [] Non-healing ulcers Pulmonary:   [] Uses home oxygen   [] Productive cough   [] Hemoptysis   [] Wheeze  [] COPD   [] Asthma Neurologic:  [] Dizziness   [x] Seizures    [x] History of stroke   [x] History of TIA  [] Aphasia   [] Vissual changes   [] Weakness or numbness in arm   [] Weakness or numbness in leg Musculoskeletal:   [] Joint swelling   [] Joint pain   [] Low back pain  []  History of Knee Replacement Hematologic:  [] Easy bruising  [] Easy bleeding   [] Hypercoagulable state   [] Anemic Gastrointestinal:  [] Diarrhea   [] Vomiting  [x] Gastroesophageal reflux/heartburn   [] Difficulty swallowing. Genitourinary:  [] Chronic kidney disease   [] Difficult urination  [] Anuric   [] Blood in urine Skin:  [] Rashes   [] Ulcers  Psychological:  [x] History of anxiety   [x]  History of major depression  []  Memory Difficulties     Objective:   Physical Exam  BP 114/73 (BP Location: Right Arm, Patient Position: Sitting)   Pulse 60   Resp 18   Ht 5\' 5"  (1.651 m)   Wt 165 lb (74.8 kg)   BMI 27.46 kg/m   Gen: WD/WN, NAD Head: [place]/AT, No temporalis wasting.  Ear/Nose/Throat: Hearing grossly intact, nares w/o erythema or drainage Eyes: PER, EOMI, sclera nonicteric.  Neck: Supple, no masses.  No JVD. No bruit auscultated Pulmonary:  Good air movement, no use of accessory muscles.  Cardiac: RRR Vascular:  Vessel Right Left  Radial Palpable Palpable   Gastrointestinal: soft, non-distended. No guarding/no peritoneal signs.  Musculoskeletal: M/S 5/5 throughout.   No deformity or atrophy.  Neurologic: Pain and light touch intact in extremities.  Symmetrical.  Speech is fluent. Motor exam as listed above. Psychiatric: Judgment intact, Mood & affect appropriate for pt's clinical situation. Dermatologic: No Venous rashes. No Ulcers Noted.  No changes consistent with cellulitis. Lymph : No Cervical lymphadenopathy, no lichenification or skin changes of chronic lymphedema.      Assessment & Plan:   1. Carotid stenosis, symptomatic, with infarction [facility]) Recommend:  The patient is s/p successful right ICA stent placement. No current complaints  Continue antiplatelet therapy as  prescribed Continue management of CAD, HTN and Hyperlipidemia Healthy heart diet,  encouraged exercise at least 4 times per week  Follow up in 1-2  months with duplex ultrasound and physical exam based on the patient's carotid stent.  - VAS US CAROTID; Future  2. Gastroesophageal reflux disease, esophagitis presence not specified Continue PPI as already ordered, this medication has been reviewed and there are no changes at this time.  Avoidence of caffeine and alcohol  Moderate elevation of the head of the bed   3. Hyperlipidemia, unspecified hyperlipidemia type Continue statin as ordered and reviewed, no  changes at this time    Current Outpatient Medications on File Prior to Visit  Medication Sig Dispense Refill  . ARIPiprazole (ABILIFY) 10 MG tablet Take 10 mg by mouth daily.    Marland Kitchen. aspirin EC 81 MG tablet Take 1 tablet (81 mg total) by mouth daily. 30 tablet 2  . atorvastatin (LIPITOR) 40 MG tablet Take 1 tablet (40 mg total) by mouth daily at 6 PM. 30 tablet 2  . clopidogrel (PLAVIX) 75 MG tablet Take 1 tablet (75 mg total) by mouth daily. 30 tablet 2  . hydrocortisone cream 1 % Apply topically.    . lamoTRIgine (LAMICTAL) 150 MG tablet Take 150 mg by mouth daily.    Marland Kitchen. loratadine (CLARITIN) 10 MG tablet Take 10 mg by mouth daily.  0  . lovastatin (MEVACOR) 20 MG tablet Take by mouth.    Marland Kitchen. omeprazole (PRILOSEC) 20 MG capsule Take 20 mg by mouth 2 (two) times daily.  0   No current facility-administered medications on file prior to visit.     There are no Patient Instructions on file for this visit. Return in about 2 months (around 03/31/2018).   Georgiana SpinnerFallon E Cadin Luka, NP  This note was completed with Office managerDragon Dictation.  Any errors are purely unintentional.

## 2018-01-29 NOTE — Telephone Encounter (Signed)
Spoke with the patient and let him know the disability paperwork has been faxed and I let him know I can put the paperwork at the front desk for him to pickup. Patient declined that stating he didn't need it.

## 2018-02-05 ENCOUNTER — Ambulatory Visit (INDEPENDENT_AMBULATORY_CARE_PROVIDER_SITE_OTHER): Payer: Self-pay | Admitting: Nurse Practitioner

## 2018-02-05 ENCOUNTER — Encounter (INDEPENDENT_AMBULATORY_CARE_PROVIDER_SITE_OTHER): Payer: Self-pay

## 2018-03-20 ENCOUNTER — Encounter (INDEPENDENT_AMBULATORY_CARE_PROVIDER_SITE_OTHER): Payer: BLUE CROSS/BLUE SHIELD

## 2018-03-20 ENCOUNTER — Ambulatory Visit (INDEPENDENT_AMBULATORY_CARE_PROVIDER_SITE_OTHER): Payer: BLUE CROSS/BLUE SHIELD | Admitting: Nurse Practitioner

## 2018-03-26 ENCOUNTER — Other Ambulatory Visit (INDEPENDENT_AMBULATORY_CARE_PROVIDER_SITE_OTHER): Payer: Self-pay | Admitting: Nurse Practitioner

## 2018-03-26 ENCOUNTER — Telehealth (INDEPENDENT_AMBULATORY_CARE_PROVIDER_SITE_OTHER): Payer: Self-pay | Admitting: Nurse Practitioner

## 2018-03-27 ENCOUNTER — Other Ambulatory Visit (INDEPENDENT_AMBULATORY_CARE_PROVIDER_SITE_OTHER): Payer: Self-pay | Admitting: Nurse Practitioner

## 2018-03-27 MED ORDER — CLOPIDOGREL BISULFATE 75 MG PO TABS: 75.0000 mg | ORAL_TABLET | Freq: Every day | ORAL | 11 refills | Status: AC

## 2018-03-27 MED ORDER — ATORVASTATIN CALCIUM 40 MG PO TABS: 40.0000 mg | ORAL_TABLET | Freq: Every day | ORAL | 0 refills | Status: AC

## 2018-03-27 NOTE — Telephone Encounter (Signed)
Called patient and left on personal voicemail [phone] (mobile) the message below

## 2018-03-27 NOTE — Telephone Encounter (Signed)
I have called these in, but the lipitor is a one time prescription, as he needs to see a PCP for continued management of the lipitor

## 2018-04-03 ENCOUNTER — Encounter (INDEPENDENT_AMBULATORY_CARE_PROVIDER_SITE_OTHER): Payer: BLUE CROSS/BLUE SHIELD

## 2018-04-03 ENCOUNTER — Ambulatory Visit (INDEPENDENT_AMBULATORY_CARE_PROVIDER_SITE_OTHER): Payer: BLUE CROSS/BLUE SHIELD | Admitting: Nurse Practitioner

## 2018-04-08 ENCOUNTER — Encounter (INDEPENDENT_AMBULATORY_CARE_PROVIDER_SITE_OTHER): Payer: Self-pay | Admitting: Nurse Practitioner

## 2018-04-19 ENCOUNTER — Emergency Department
Admission: EM | Admit: 2018-04-19 | Discharge: 2018-04-19 | Disposition: A | Discharge status: Home / Self Care | Payer: BLUE CROSS/BLUE SHIELD | Attending: Emergency Medicine | Admitting: Emergency Medicine

## 2018-04-19 ENCOUNTER — Other Ambulatory Visit: Payer: Self-pay

## 2018-04-19 DIAGNOSIS — M109 Gout, unspecified: Secondary | ICD-10-CM | POA: Diagnosis not present

## 2018-04-19 DIAGNOSIS — F17210 Nicotine dependence, cigarettes, uncomplicated: Secondary | ICD-10-CM | POA: Insufficient documentation

## 2018-04-19 DIAGNOSIS — Z79899 Other long term (current) drug therapy: Secondary | ICD-10-CM | POA: Insufficient documentation

## 2018-04-19 DIAGNOSIS — I10 Essential (primary) hypertension: Secondary | ICD-10-CM | POA: Insufficient documentation

## 2018-04-19 DIAGNOSIS — Z7901 Long term (current) use of anticoagulants: Secondary | ICD-10-CM | POA: Diagnosis not present

## 2018-04-19 DIAGNOSIS — M79642 Pain in left hand: Secondary | ICD-10-CM | POA: Diagnosis present

## 2018-04-19 MED ORDER — DEXAMETHASONE SODIUM PHOSPHATE 10 MG/ML IJ SOLN
10.0000 mg | Freq: Once | INTRAMUSCULAR | Status: AC
  Administered 2018-04-19: 10 mg via INTRAMUSCULAR
  Filled 2018-04-19: qty 1

## 2018-04-19 MED ORDER — PREDNISONE 10 MG PO TABS: ORAL_TABLET | ORAL | 0 refills | Status: DC

## 2018-04-19 MED ORDER — ACETAMINOPHEN-CODEINE #3 300-30 MG PO TABS: 1.0000 | ORAL_TABLET | Freq: Four times a day (QID) | ORAL | 0 refills | Status: DC | PRN

## 2018-04-19 NOTE — Discharge Instructions (Signed)
Call your primary care provider if any continued problems.  Begin taking medication only as directed.  Not drive or operate machinery while taking the pain medication as it could cause drowsiness increase your risk for injury.  Take prednisone as directed.

## 2018-04-19 NOTE — ED Provider Notes (Signed)
[facility] Emergency Department Provider Note   ____________________________________________   None    (approximate)  I have reviewed the triage vital signs and the nursing notes.   HISTORY  Chief Complaint Hand Pain   HPI Timothy Pham is a 53 y.o. male presents to the ED with complaint of left hand and foot pain for several days.  Patient states that he has a history of gout and this feels very similar.  He states that he was placed on allopurinol daily to prevent gout but seems to be having more frequent flareups.  He denies any recent injury to his hand or foot.  In the past he has been treated with prednisone which is worked well.  Currently rates his pain as 10/10.      Past Medical History:  Diagnosis Date  . Anxiety   . Bipolar 1 disorder (HCC)   . GERD (gastroesophageal reflux disease)   . Hyperlipidemia   . Hypertension   . Nephrolithiasis   . Stroke [facility])    December 2019    Patient Active Problem List   Diagnosis Date Noted  . GERD (gastroesophageal reflux disease) 01/28/2018  . Hyperlipidemia 01/28/2018  . Carotid stenosis, symptomatic, with infarction (HCC) 01/28/2018  . Headache 01/20/2018    Past Surgical History:  Procedure Laterality Date  . CAROTID ANGIOGRAPHY Bilateral 01/21/2018   Procedure: CAROTID ANGIOGRAPHY;  Surgeon: Renford Dills, MD;  Location: [facility] INVASIVE CV LAB;  Service: Cardiovascular;  Laterality: Bilateral;    Prior to Admission medications   Medication Sig Start Date End Date Taking? Authorizing Provider  acetaminophen-codeine (TYLENOL #3) 300-30 MG tablet Take 1 tablet by mouth every 6 (six) hours as needed for moderate pain. 04/19/18   Tommi Rumps, PA-C  ARIPiprazole (ABILIFY) 10 MG tablet Take 10 mg by mouth daily.    [provider]  aspirin EC 81 MG tablet Take 1 tablet (81 mg total) by mouth daily. 01/22/18 04/22/18  Shaune Pollack, MD  atorvastatin (LIPITOR) 40 MG tablet Take 1 tablet  (40 mg total) by mouth daily at 6 PM. 03/27/18   Georgiana Spinner, NP  clopidogrel (PLAVIX) 75 MG tablet Take 1 tablet (75 mg total) by mouth daily. 03/27/18   Georgiana Spinner, NP  lamoTRIgine (LAMICTAL) 150 MG tablet Take 150 mg by mouth daily.    [provider]  loratadine (CLARITIN) 10 MG tablet Take 10 mg by mouth daily. 09/09/16   [provider]  lovastatin (MEVACOR) 20 MG tablet Take by mouth. 12/11/16   [provider]  omeprazole (PRILOSEC) 20 MG capsule Take 20 mg by mouth 2 (two) times daily. 09/09/16   [provider]  predniSONE (DELTASONE) 10 MG tablet Take 6 tablets  today, on day 2 take 5 tablets, day 3 take 4 tablets, day 4 take 3 tablets, day 5 take  2 tablets and 1 tablet the last day 04/19/18   Tommi Rumps, PA-C    Allergies Wellbutrin [bupropion]  Family History  Problem Relation Age of Onset  . Heart disease Mother   . Bipolar disorder Mother   . Heart disease Father   . Diabetes Mellitus II Father   . Obesity Sister   . Heart disease Brother     Social History Social History   Tobacco Use  . Smoking status: Current Every Day Smoker    Packs/day: 1.00    Types: Cigarettes  . Smokeless tobacco: Never Used  Substance Use Topics  .  Alcohol use: No  . Drug use: No    Review of Systems Constitutional: No fever/chills Cardiovascular: Denies chest pain. Respiratory: Denies shortness of breath. Gastrointestinal: No abdominal pain.  No nausea, no vomiting. Musculoskeletal: Positive left wrist and left foot pain. Skin: Negative for rash. Neurological: Negative for headaches, focal weakness or numbness. ____________________________________________   PHYSICAL EXAM:  VITAL SIGNS: ED Triage Vitals  Enc Vitals Group     BP 04/19/18 0514 (!) 167/89     Pulse Rate 04/19/18 0514 88     Resp 04/19/18 0514 18     Temp 04/19/18 0514 98.3 F (36.8 C)     Temp Source 04/19/18 0514 Oral     SpO2 04/19/18 0514 95 %     Weight  04/19/18 0515 165 lb (74.8 kg)     Height 04/19/18 0515 5\' 5"  (1.651 m)     Head Circumference --      Peak Flow --      Pain Score 04/19/18 0514 10     Pain Loc --      Pain Edu? --      Excl. in GC? --    Constitutional: Alert and oriented. Well appearing and in no acute distress. Eyes: Conjunctivae are normal.  Head: Atraumatic. Neck: No stridor.   Cardiovascular: Normal rate, regular rhythm. Grossly normal heart sounds.  Good peripheral circulation. Respiratory: Normal respiratory effort.  No retractions. Lungs CTAB. Musculoskeletal: On examination of left hand there is moderate soft tissue edema with warmth diffusely over the dorsal aspect.  No evidence of injury.  Patient is able to slowly move all digits without any difficulty however movement is restricted secondary to discomfort.  Capillary refill is less than 3 seconds.  Pulses present.  On examination of the left foot there is no gross deformity however there is tenderness on palpation of the dorsal aspect.  Skin is intact.  Slightly warm to touch.  No rash present. Neurologic:  Normal speech and language. No gross focal neurologic deficits are appreciated.  Skin:  Skin is warm, dry and intact.  Skin is warm and slightly erythematous on inspection. Psychiatric: Mood and affect are normal. Speech and behavior are normal.  ____________________________________________   LABS (all labs ordered are listed, but only abnormal results are displayed)  Labs Reviewed - No data to display   PROCEDURES  Procedure(s) performed (including Critical Care):  Procedures   ____________________________________________   INITIAL IMPRESSION / ASSESSMENT AND PLAN / ED COURSE  As part of my medical decision making, I reviewed the following data within the electronic MEDICAL RECORD NUMBER Notes from prior ED visits and [place] Controlled Substance Database  53 year old male presents to the ED with complaint of left wrist pain and left foot pain  without history of injury.  Patient has history of gout and states this feels similar to his gout.  Currently he is taking allopurinol which he states does not seem to be helping decrease the frequency of his attacks.  Patient is to follow-up with his PCP about the allopurinol.  Patient was given Decadron 10 mg IM while in the ED.  He was also started on a Dosepak which she has taken in the past with good relief of his gout.  He was also given a prescription for Tylenol 3 as needed for moderate pain every 6 hours.  He is aware that he cannot drive or operate machinery while taking this medication. ____________________________________________   FINAL CLINICAL IMPRESSION(S) / ED DIAGNOSES  Final diagnoses:  Acute gout of left wrist, unspecified cause  Acute gout of left foot, unspecified cause     ED Discharge Orders         Ordered    predniSONE (DELTASONE) 10 MG tablet     04/19/18 0810    acetaminophen-codeine (TYLENOL #3) 300-30 MG tablet  Every 6 hours PRN     04/19/18 0810           Note:  This document was prepared using Dragon voice recognition software and may include unintentional dictation errors.    Tommi Rumps, PA-C 04/19/18 1554    Arnaldo Natal, MD 04/19/18 [phone]

## 2018-04-19 NOTE — ED Triage Notes (Signed)
Patient reports left hand and left foot pain that he feels is gout.  Left hand with swelling and redness noted.

## 2018-04-29 ENCOUNTER — Encounter: Payer: Self-pay | Admitting: Emergency Medicine

## 2018-04-29 ENCOUNTER — Other Ambulatory Visit: Payer: Self-pay

## 2018-04-29 DIAGNOSIS — Z833 Family history of diabetes mellitus: Secondary | ICD-10-CM

## 2018-04-29 DIAGNOSIS — Z79899 Other long term (current) drug therapy: Secondary | ICD-10-CM

## 2018-04-29 DIAGNOSIS — F17210 Nicotine dependence, cigarettes, uncomplicated: Secondary | ICD-10-CM | POA: Diagnosis present

## 2018-04-29 DIAGNOSIS — K760 Fatty (change of) liver, not elsewhere classified: Secondary | ICD-10-CM | POA: Diagnosis present

## 2018-04-29 DIAGNOSIS — F319 Bipolar disorder, unspecified: Secondary | ICD-10-CM | POA: Diagnosis present

## 2018-04-29 DIAGNOSIS — R1110 Vomiting, unspecified: Secondary | ICD-10-CM | POA: Diagnosis not present

## 2018-04-29 DIAGNOSIS — Z66 Do not resuscitate: Secondary | ICD-10-CM | POA: Diagnosis present

## 2018-04-29 DIAGNOSIS — Z955 Presence of coronary angioplasty implant and graft: Secondary | ICD-10-CM

## 2018-04-29 DIAGNOSIS — Z7902 Long term (current) use of antithrombotics/antiplatelets: Secondary | ICD-10-CM

## 2018-04-29 DIAGNOSIS — K56699 Other intestinal obstruction unspecified as to partial versus complete obstruction: Principal | ICD-10-CM | POA: Diagnosis present

## 2018-04-29 DIAGNOSIS — Z8249 Family history of ischemic heart disease and other diseases of the circulatory system: Secondary | ICD-10-CM

## 2018-04-29 DIAGNOSIS — Z7952 Long term (current) use of systemic steroids: Secondary | ICD-10-CM

## 2018-04-29 DIAGNOSIS — Z8673 Personal history of transient ischemic attack (TIA), and cerebral infarction without residual deficits: Secondary | ICD-10-CM

## 2018-04-29 DIAGNOSIS — M109 Gout, unspecified: Secondary | ICD-10-CM | POA: Diagnosis present

## 2018-04-29 DIAGNOSIS — I10 Essential (primary) hypertension: Secondary | ICD-10-CM | POA: Diagnosis present

## 2018-04-29 DIAGNOSIS — K219 Gastro-esophageal reflux disease without esophagitis: Secondary | ICD-10-CM | POA: Diagnosis present

## 2018-04-29 DIAGNOSIS — E785 Hyperlipidemia, unspecified: Secondary | ICD-10-CM | POA: Diagnosis present

## 2018-04-29 DIAGNOSIS — K529 Noninfective gastroenteritis and colitis, unspecified: Secondary | ICD-10-CM | POA: Diagnosis present

## 2018-04-29 LAB — COMPREHENSIVE METABOLIC PANEL
ALT: 47 U/L — ABNORMAL HIGH (ref 0–44)
AST: 29 U/L (ref 15–41)
Albumin: 4.7 g/dL (ref 3.5–5.0)
Alkaline Phosphatase: 109 U/L (ref 38–126)
Anion gap: 15 (ref 5–15)
BUN: 19 mg/dL (ref 6–20)
CO2: 27 mmol/L (ref 22–32)
Calcium: 10.2 mg/dL (ref 8.9–10.3)
Chloride: 101 mmol/L (ref 98–111)
Creatinine, Ser: 1.11 mg/dL (ref 0.61–1.24)
GFR calc Af Amer: >60 mL/min (ref >60)
Glucose, Bld: 150 mg/dL — ABNORMAL HIGH (ref 70–99)
Potassium: 4.3 mmol/L (ref 3.5–5.1)
Sodium: 143 mmol/L (ref 135–145)
Total Bilirubin: 0.7 mg/dL (ref 0.3–1.2)
Total Protein: 8.5 g/dL — ABNORMAL HIGH (ref 6.5–8.1)

## 2018-04-29 LAB — CBC
HCT: 47.6 % (ref 39.0–52.0)
Hemoglobin: 15.5 g/dL (ref 13.0–17.0)
MCH: 31.2 pg (ref 26.0–34.0)
MCHC: 32.6 g/dL (ref 30.0–36.0)
MCV: 95.8 fL (ref 80.0–100.0)
Platelets: 389 10*3/uL (ref 150–400)
RBC: 4.97 MIL/uL (ref 4.22–5.81)
RDW: 15 % (ref 11.5–15.5)
WBC: 19.9 10*3/uL — ABNORMAL HIGH (ref 4.0–10.5)
nRBC: 0 % (ref 0.0–0.2)

## 2018-04-29 LAB — LIPASE, BLOOD: Lipase: 41 U/L (ref 11–51)

## 2018-04-29 MED ORDER — SODIUM CHLORIDE 0.9% FLUSH: 3.0000 mL | Freq: Once | INTRAVENOUS | Status: DC

## 2018-04-29 NOTE — ED Triage Notes (Signed)
Patient coming in for abd pain x 1week but got really bad to today per patient. States he had 2 episodes of vomiting, states he thinks he is constipated.

## 2018-04-30 ENCOUNTER — Inpatient Hospital Stay: Payer: BLUE CROSS/BLUE SHIELD

## 2018-04-30 ENCOUNTER — Emergency Department: Payer: BLUE CROSS/BLUE SHIELD

## 2018-04-30 ENCOUNTER — Inpatient Hospital Stay
Admission: EM | Admit: 2018-04-30 | Discharge: 2018-05-01 | DRG: 390 | Disposition: A | Discharge status: Home / Self Care | Payer: BLUE CROSS/BLUE SHIELD | Attending: Internal Medicine | Admitting: Internal Medicine

## 2018-04-30 DIAGNOSIS — Z7902 Long term (current) use of antithrombotics/antiplatelets: Secondary | ICD-10-CM | POA: Diagnosis not present

## 2018-04-30 DIAGNOSIS — R1110 Vomiting, unspecified: Secondary | ICD-10-CM | POA: Diagnosis present

## 2018-04-30 DIAGNOSIS — I10 Essential (primary) hypertension: Secondary | ICD-10-CM | POA: Diagnosis present

## 2018-04-30 DIAGNOSIS — K529 Noninfective gastroenteritis and colitis, unspecified: Secondary | ICD-10-CM | POA: Diagnosis present

## 2018-04-30 DIAGNOSIS — Z955 Presence of coronary angioplasty implant and graft: Secondary | ICD-10-CM | POA: Diagnosis not present

## 2018-04-30 DIAGNOSIS — K56699 Other intestinal obstruction unspecified as to partial versus complete obstruction: Secondary | ICD-10-CM | POA: Diagnosis present

## 2018-04-30 DIAGNOSIS — K760 Fatty (change of) liver, not elsewhere classified: Secondary | ICD-10-CM | POA: Diagnosis present

## 2018-04-30 DIAGNOSIS — E785 Hyperlipidemia, unspecified: Secondary | ICD-10-CM | POA: Diagnosis present

## 2018-04-30 DIAGNOSIS — Z66 Do not resuscitate: Secondary | ICD-10-CM | POA: Diagnosis present

## 2018-04-30 DIAGNOSIS — M109 Gout, unspecified: Secondary | ICD-10-CM | POA: Diagnosis present

## 2018-04-30 DIAGNOSIS — K219 Gastro-esophageal reflux disease without esophagitis: Secondary | ICD-10-CM | POA: Diagnosis present

## 2018-04-30 DIAGNOSIS — F319 Bipolar disorder, unspecified: Secondary | ICD-10-CM | POA: Diagnosis present

## 2018-04-30 DIAGNOSIS — K56609 Unspecified intestinal obstruction, unspecified as to partial versus complete obstruction: Secondary | ICD-10-CM | POA: Diagnosis not present

## 2018-04-30 DIAGNOSIS — Z8673 Personal history of transient ischemic attack (TIA), and cerebral infarction without residual deficits: Secondary | ICD-10-CM | POA: Diagnosis not present

## 2018-04-30 DIAGNOSIS — Z8249 Family history of ischemic heart disease and other diseases of the circulatory system: Secondary | ICD-10-CM | POA: Diagnosis not present

## 2018-04-30 DIAGNOSIS — Z79899 Other long term (current) drug therapy: Secondary | ICD-10-CM | POA: Diagnosis not present

## 2018-04-30 DIAGNOSIS — Z7952 Long term (current) use of systemic steroids: Secondary | ICD-10-CM | POA: Diagnosis not present

## 2018-04-30 DIAGNOSIS — F17210 Nicotine dependence, cigarettes, uncomplicated: Secondary | ICD-10-CM | POA: Diagnosis present

## 2018-04-30 DIAGNOSIS — Z833 Family history of diabetes mellitus: Secondary | ICD-10-CM | POA: Diagnosis not present

## 2018-04-30 LAB — BASIC METABOLIC PANEL
Anion gap: 10 (ref 5–15)
BUN: 15 mg/dL (ref 6–20)
CO2: 25 mmol/L (ref 22–32)
Calcium: 8.8 mg/dL — ABNORMAL LOW (ref 8.9–10.3)
Chloride: 105 mmol/L (ref 98–111)
Creatinine, Ser: 0.7 mg/dL (ref 0.61–1.24)
GFR calc non Af Amer: >60 mL/min (ref >60)
Glucose, Bld: 96 mg/dL (ref 70–99)
Potassium: 4 mmol/L (ref 3.5–5.1)
Sodium: 140 mmol/L (ref 135–145)

## 2018-04-30 MED ORDER — SODIUM CHLORIDE 0.9 % IV SOLN
INTRAVENOUS | Status: DC
  Administered 2018-04-30 – 2018-05-01 (×3): via INTRAVENOUS

## 2018-04-30 MED ORDER — ATORVASTATIN CALCIUM 20 MG PO TABS
40.0000 mg | ORAL_TABLET | Freq: Every day | ORAL | Status: DC
  Administered 2018-04-30: 40 mg via ORAL
  Filled 2018-04-30 (×2): qty 2

## 2018-04-30 MED ORDER — MORPHINE SULFATE (PF) 4 MG/ML IV SOLN
INTRAVENOUS | Status: AC
  Administered 2018-04-30: 04:00:00
  Filled 2018-04-30: qty 1

## 2018-04-30 MED ORDER — NICOTINE 21 MG/24HR TD PT24
21.0000 mg | MEDICATED_PATCH | Freq: Every day | TRANSDERMAL | Status: DC
  Administered 2018-04-30 – 2018-05-01 (×2): 21 mg via TRANSDERMAL
  Filled 2018-04-30 (×2): qty 1

## 2018-04-30 MED ORDER — PANTOPRAZOLE SODIUM 40 MG IV SOLR
40.0000 mg | INTRAVENOUS | Status: DC
  Administered 2018-04-30: 40 mg via INTRAVENOUS
  Filled 2018-04-30: qty 40

## 2018-04-30 MED ORDER — ONDANSETRON HCL 4 MG/2ML IJ SOLN
INTRAMUSCULAR | Status: AC
  Administered 2018-04-30: 04:00:00
  Filled 2018-04-30: qty 2

## 2018-04-30 MED ORDER — NICOTINE 14 MG/24HR TD PT24: 14.0000 mg | MEDICATED_PATCH | Freq: Every day | TRANSDERMAL | Status: DC

## 2018-04-30 MED ORDER — BUPROPION HCL 75 MG PO TABS
75.0000 mg | ORAL_TABLET | Freq: Every day | ORAL | Status: DC
  Administered 2018-04-30 – 2018-05-01 (×2): 75 mg via ORAL
  Filled 2018-04-30 (×3): qty 1

## 2018-04-30 MED ORDER — ENOXAPARIN SODIUM 40 MG/0.4ML ~~LOC~~ SOLN
40.0000 mg | SUBCUTANEOUS | Status: DC
  Filled 2018-04-30: qty 0.4

## 2018-04-30 MED ORDER — ACETAMINOPHEN-CODEINE #3 300-30 MG PO TABS: 1.0000 | ORAL_TABLET | Freq: Four times a day (QID) | ORAL | Status: DC | PRN

## 2018-04-30 MED ORDER — LEVOFLOXACIN IN D5W 500 MG/100ML IV SOLN
500.0000 mg | INTRAVENOUS | Status: DC
  Administered 2018-04-30: 500 mg via INTRAVENOUS
  Filled 2018-04-30 (×2): qty 100

## 2018-04-30 MED ORDER — MORPHINE SULFATE (PF) 2 MG/ML IV SOLN
2.0000 mg | INTRAVENOUS | Status: DC | PRN
  Administered 2018-04-30: 2 mg via INTRAVENOUS
  Filled 2018-04-30: qty 1

## 2018-04-30 MED ORDER — ALLOPURINOL 100 MG PO TABS
100.0000 mg | ORAL_TABLET | Freq: Every day | ORAL | Status: DC
  Administered 2018-04-30 – 2018-05-01 (×2): 100 mg via ORAL
  Filled 2018-04-30 (×3): qty 1

## 2018-04-30 MED ORDER — AMLODIPINE BESYLATE 5 MG PO TABS
5.0000 mg | ORAL_TABLET | Freq: Every day | ORAL | Status: DC
  Administered 2018-04-30 – 2018-05-01 (×2): 5 mg via ORAL
  Filled 2018-04-30 (×2): qty 1

## 2018-04-30 MED ORDER — IOHEXOL 300 MG/ML  SOLN
100.0000 mL | Freq: Once | INTRAMUSCULAR | Status: AC | PRN
  Administered 2018-04-30: 100 mL via INTRAVENOUS

## 2018-04-30 NOTE — Consult Note (Addendum)
[facility] SURGICAL CONSULTATION NOTE (initial) - cpt: 16109: [id number] (Outpatient/ED)   HISTORY OF PRESENT ILLNESS (HPI):  53 y.o. male presented to [facility] ED today for evaluation of abdominal pain. Patient reports that he has had about a 1 week history of generalized abdominal discomfort and fullness. He also feels his abdomen had been progressively worsening over the course of the last week. He had associated nausea and 1 episode of emesis. No complaints of fever, chills, CP, SOB, bowel or bladder changes. He has no history of previous abdominal surgeries. No history of similar pain. Work up in the ED was concerning for leukocytosis and possible SBO.   This morning, he reports that he is feeling better since placement of his NGT in the ED overnight. He feels that his abdominal distension has improved significantly. He continues to deny any symptoms this morning. He endorses passing flatus and having a BM on arrival to the ED.   Surgery is consulted by emergency medicine physician Dr. Willy EddyPatrick robinson, MD in this context for evaluation and management of small bowel obstruction.   PAST MEDICAL HISTORY (PMH):  Past Medical History:  Diagnosis Date  . Anxiety   . Bipolar 1 disorder (HCC)   . GERD (gastroesophageal reflux disease)   . Hyperlipidemia   . Hypertension   . Nephrolithiasis   . Stroke [facility](HCC)    December 2019     PAST SURGICAL HISTORY [facility](PSH):  Past Surgical History:  Procedure Laterality Date  . CAROTID ANGIOGRAPHY Bilateral 01/21/2018   Procedure: CAROTID ANGIOGRAPHY;  Surgeon: Renford DillsSchnier, Gregory G, MD;  Location: [facility] INVASIVE CV LAB;  Service: Cardiovascular;  Laterality: Bilateral;     MEDICATIONS:  Prior to Admission medications   Medication Sig Start Date End Date Taking? Authorizing Provider  acetaminophen-codeine (TYLENOL #3) 300-30 MG tablet Take 1 tablet by mouth every 6 (six) hours as needed for moderate pain. 04/19/18   Tommi RumpsSummers, Rhonda L, PA-C  ARIPiprazole  (ABILIFY) 10 MG tablet Take 10 mg by mouth daily.    [provider]  atorvastatin (LIPITOR) 40 MG tablet Take 1 tablet (40 mg total) by mouth daily at 6 PM. 03/27/18   Georgiana SpinnerBrown, Fallon E, NP  clopidogrel (PLAVIX) 75 MG tablet Take 1 tablet (75 mg total) by mouth daily. 03/27/18   Georgiana SpinnerBrown, Fallon E, NP  lamoTRIgine (LAMICTAL) 150 MG tablet Take 150 mg by mouth daily.    [provider]  loratadine (CLARITIN) 10 MG tablet Take 10 mg by mouth daily. 09/09/16   [provider]  lovastatin (MEVACOR) 20 MG tablet Take by mouth. 12/11/16   [provider]  omeprazole (PRILOSEC) 20 MG capsule Take 20 mg by mouth 2 (two) times daily. 09/09/16   [provider]  predniSONE (DELTASONE) 10 MG tablet Take 6 tablets  today, on day 2 take 5 tablets, day 3 take 4 tablets, day 4 take 3 tablets, day 5 take  2 tablets and 1 tablet the last day 04/19/18   Tommi RumpsSummers, Rhonda L, PA-C     ALLERGIES:  Allergies  Allergen Reactions  . Wellbutrin [Bupropion] Itching     SOCIAL HISTORY:  Social History   Socioeconomic History  . Marital status: Single    Spouse name: Not on file  . Number of children: Not on file  . Years of education: Not on file  . Highest education level: Not on file  Occupational History  . Not on file  Social Needs  . Financial resource strain: Not on file  .  Food insecurity:    Worry: Not on file    Inability: Not on file  . Transportation needs:    Medical: Not on file    Non-medical: Not on file  Tobacco Use  . Smoking status: Current Every Day Smoker    Packs/day: 1.00    Types: Cigarettes  . Smokeless tobacco: Never Used  Substance and Sexual Activity  . Alcohol use: No  . Drug use: No  . Sexual activity: Not on file  Lifestyle  . Physical activity:    Days per week: Not on file    Minutes per session: Not on file  . Stress: Not on file  Relationships  . Social connections:    Talks on phone: Not on file    Gets together: Not on file     Attends religious service: Not on file    Active member of club or organization: Not on file    Attends meetings of clubs or organizations: Not on file    Relationship status: Not on file  . Intimate partner violence:    Fear of current or ex partner: Not on file    Emotionally abused: Not on file    Physically abused: Not on file    Forced sexual activity: Not on file  Other Topics Concern  . Not on file  Social History Narrative  . Not on file     FAMILY HISTORY:  Family History  Problem Relation Age of Onset  . Heart disease Mother   . Bipolar disorder Mother   . Heart disease Father   . Diabetes Mellitus II Father   . Obesity Sister   . Heart disease Brother       REVIEW OF SYSTEMS:  Review of Systems  Constitutional: Negative for chills and fever.  Respiratory: Negative for cough and shortness of breath.   Cardiovascular: Negative for chest pain and palpitations.  Gastrointestinal: Positive for abdominal pain, nausea and vomiting. Negative for blood in stool, constipation and diarrhea.  Genitourinary: Negative for dysuria and urgency.  Neurological: Negative for dizziness and headaches.  All other systems reviewed and are negative.   VITAL SIGNS:  Temp:  [98.2 F (36.8 C)] 98.2 F (36.8 C) (03/11 2145) Pulse Rate:  [71-99] 76 (03/12 0700) Resp:  [14-31] 14 (03/12 0700) BP: (124-171)/(79-94) 130/86 (03/12 0700) SpO2:  [86 %-97 %] 97 % (03/12 0700) Weight:  [74.8 kg] 74.8 kg (03/11 2148)     Height: 5\' 5"  (165.1 cm) Weight: 74.8 kg BMI (Calculated): 27.46   INTAKE/OUTPUT:  This shift: No intake/output data recorded.  Last 2 shifts: @IOLAST2SHIFTS @   PHYSICAL EXAM:  Physical Exam Vitals signs and nursing note reviewed.  Constitutional:      General: He is not in acute distress.    Appearance: He is well-developed. He is obese. He is not ill-appearing.  HENT:     Head: Normocephalic and atraumatic.     Comments: NGT in left nare Eyes:     General: No  scleral icterus.    Extraocular Movements: Extraocular movements intact.  Cardiovascular:     Rate and Rhythm: Normal rate and regular rhythm.     Heart sounds: Normal heart sounds. No murmur. No friction rub. No gallop.   Pulmonary:     Effort: Pulmonary effort is normal. No respiratory distress.     Breath sounds: Normal breath sounds. No wheezing or rhonchi.  Abdominal:     General: Abdomen is protuberant. There is no distension.  Palpations: Abdomen is soft.     Tenderness: There is no abdominal tenderness. There is no guarding or rebound.  Genitourinary:    Comments: Deferred Skin:    General: Skin is warm and dry.     Coloration: Skin is not jaundiced or pale.  Neurological:     General: No focal deficit present.     Mental Status: He is alert and oriented to person, place, and time.  Psychiatric:        Mood and Affect: Mood normal.        Behavior: Behavior normal.       Labs:  CBC Latest Ref Rng & Units 04/29/2018 01/22/2018 01/21/2018  WBC 4.0 - 10.5 K/uL 19.9(H) 8.4 8.1  Hemoglobin 13.0 - 17.0 g/dL 38.8 12.3(L) 13.9  Hematocrit 39.0 - 52.0 % 47.6 37.4(L) 41.7  Platelets 150 - 400 K/uL 389 259 278   CMP Latest Ref Rng & Units 04/29/2018 01/22/2018 01/21/2018  Glucose 70 - 99 mg/dL 875(Z) 93 90  BUN 6 - 20 mg/dL 19 18 15   Creatinine 0.61 - 1.24 mg/dL 9.72 8.20 6.01  Sodium 135 - 145 mmol/L 143 141 142  Potassium 3.5 - 5.1 mmol/L 4.3 3.6 3.6  Chloride 98 - 111 mmol/L 101 109 108  CO2 22 - 32 mmol/L 27 24 26   Calcium 8.9 - 10.3 mg/dL 56.1 8.3(L) 9.1  Total Protein 6.5 - 8.1 g/dL 5.3(P) - -  Total Bilirubin 0.3 - 1.2 mg/dL 0.7 - -  Alkaline Phos 38 - 126 U/L 109 - -  AST 15 - 41 U/L 29 - -  ALT 0 - 44 U/L 47(H) - -     Imaging studies:   CT Abdomen/Pelvis (04/29/2018) personally reviewed and radiologist report reviewed:  IMPRESSION: 1. Small bowel obstruction with transition zone in the right lower quadrant. No cause is identified. 2. Fatty infiltration of  the liver. 3. Esophageal hiatal hernia with distended esophagus, likely reflux. 4. Enlarged prostate gland. 5. Aortic atherosclerosis. 6. Nonobstructive stones in both kidneys. 7. Compression of T10 and T12 vertebrae, likely chronic.   Assessment/Plan: (ICD-10's: K30.60) 53 y.o. male with leukocytosis and abdominal pain concerning for small bowel obstruction with transition point in RLQ however given lack of previous surgical history enteritis remains a possible cause rather than adhesive disease, complicated by pertinent comorbidities including anxiety/bipolar, HLD, HTN, history of CVA (01/2018), obesity, current tobacco abuse (smoking).   - NPO, IVF   - NGT decompression, monitor output  - Monitor abdominal examination, on-going bowel function  - Trend leukocytosis  - Pain control prn, antiemetics prn  - No indication for emergent surgical intervention at this time. He understands if he clinically deteriorates or does not respond he may require intervention  - Medical management of comorbidities per primary team.    All of the above findings and recommendations were discussed with the patient and his family, and all of patient's and his family's questions were answered to their expressed satisfaction.  Thank you for the opportunity to participate in this patient's care.   -- Lynden Oxford, PA-C [facility] 04/30/2018, 10:40 AM [phone] M-F: 7am - 4pm  I saw and evaluated the patient.  I agree with the above documentation, exam, and plan, which I have edited where appropriate. Duanne Guess  7:24 AM

## 2018-04-30 NOTE — ED Notes (Signed)
Secretary notified of transport  

## 2018-04-30 NOTE — Progress Notes (Addendum)
[facility] [place] [facility]   PATIENT NAME: Timothy Pham    MR#:  [id number]  DATE OF BIRTH:  Jul 11, 1965  SUBJECTIVE:  CHIEF COMPLAINT:   Chief Complaint  Patient presents with  . Abdominal Pain  Patient seen today Has abdominal distention No abdominal pain NG tube with suction noted  REVIEW OF SYSTEMS:    ROS  CONSTITUTIONAL: No documented fever. No fatigue, weakness. No weight gain, no weight loss.  EYES: No blurry or double vision.  ENT: No tinnitus. No postnasal drip. No redness of the oropharynx.  RESPIRATORY: No cough, no wheeze, no hemoptysis. No dyspnea.  CARDIOVASCULAR: No chest pain. No orthopnea. No palpitations. No syncope.  GASTROINTESTINAL: No nausea, no vomiting or diarrhea. Has abdominal pain. No melena or hematochezia.  GENITOURINARY: No dysuria or hematuria.  ENDOCRINE: No polyuria or nocturia. No heat or cold intolerance.  HEMATOLOGY: No anemia. No bruising. No bleeding.  INTEGUMENTARY: No rashes. No lesions.  MUSCULOSKELETAL: No arthritis. No swelling. No gout.  NEUROLOGIC: No numbness, tingling, or ataxia. No seizure-type activity.  PSYCHIATRIC: No anxiety. No insomnia. No ADD.   DRUG ALLERGIES:   Allergies  Allergen Reactions  . Wellbutrin [Bupropion] Itching    Patient thought he was allergic but is not. It is well tolerated    VITALS:  Blood pressure 138/78, pulse 70, temperature 97.7 F (36.5 C), temperature source Oral, resp. rate 19, height 5\' 5"  (1.651 m), weight 74.8 kg, SpO2 94 %.  PHYSICAL EXAMINATION:   Physical Exam  GENERAL:  53 y.o.-year-old patient lying in the bed with no acute distress.  EYES: Pupils equal, round, reactive to light and accommodation. No scleral icterus. Extraocular muscles intact.  HEENT: Head atraumatic, normocephalic. Oropharynx and nasopharynx clear.  Has NG tube NECK:  Supple, no jugular venous distention. No thyroid enlargement, no tenderness.  LUNGS: Normal breath sounds  bilaterally, no wheezing, rales, rhonchi. No use of accessory muscles of respiration.  CARDIOVASCULAR: S1, S2 normal. No murmurs, rubs, or gallops.  ABDOMEN: Soft, nontender, nondistended. Bowel sounds present. No organomegaly or mass.  EXTREMITIES: No cyanosis, clubbing or edema b/l.    NEUROLOGIC: Cranial nerves II through XII are intact. No focal Motor or sensory deficits b/l.   PSYCHIATRIC: The patient is alert and oriented x 3.  SKIN: No obvious rash, lesion, or ulcer.   LABORATORY PANEL:   CBC Recent Labs  Lab 04/29/18 2152  WBC 19.9*  HGB 15.5  HCT 47.6  PLT 389   ------------------------------------------------------------------------------------------------------------------ Chemistries  Recent Labs  Lab 04/29/18 2152  NA 143  K 4.3  CL 101  CO2 27  GLUCOSE 150*  BUN 19  CREATININE 1.11  CALCIUM 10.2  AST 29  ALT 47*  ALKPHOS 109  BILITOT 0.7   ------------------------------------------------------------------------------------------------------------------  Cardiac Enzymes No results for input(s): TROPONINI in the last 168 hours. ------------------------------------------------------------------------------------------------------------------  RADIOLOGY:  Dg Abdomen 1 View  Result Date: 04/30/2018 CLINICAL DATA:  Abdominal pain for 1 week. Vomiting. EXAM: ABDOMEN - 1 VIEW COMPARISON:  None. FINDINGS: Multiple dilated gas distended small bowel loops with relative decompression of gas-filled colon. Changes likely to represent small bowel obstruction. No radiopaque stones. Degenerative changes in the spine. IMPRESSION: Multiple dilated gas-filled small bowel loops suggesting small bowel obstruction. Electronically Signed   By: Burman Nieves M.D.   On: 04/30/2018 00:51   Ct Abdomen Pelvis W Contrast  Result Date: 04/30/2018 CLINICAL DATA:  Generalized abdominal pain and distention. EXAM: CT ABDOMEN AND PELVIS WITH CONTRAST TECHNIQUE: Multidetector  CT imaging  of the abdomen and pelvis was performed using the standard protocol following bolus administration of intravenous contrast. CONTRAST:  100 mL Omnipaque 300 COMPARISON:  None. FINDINGS: Lower chest: Mild dependent changes in the lung bases. Moderate esophageal hiatal hernia. Esophagus is distended and fluid-filled as is the hiatal hernia. Hepatobiliary: Diffuse fatty infiltration of the liver. Calcified granuloma. Gallbladder and bile ducts are unremarkable. Pancreas: Unremarkable. No pancreatic ductal dilatation or surrounding inflammatory changes. Spleen: Normal in size without focal abnormality. Adrenals/Urinary Tract: Scattered calcifications in both kidneys may be vascular or may represent stones. No hydronephrosis or hydroureter. Nephrograms are symmetrical and homogeneous. Bladder is unremarkable. Stomach/Bowel: Stomach and proximal small bowel are diffusely distended and fluid-filled. Terminal ileum is decompressed. Changes are consistent with mechanical small bowel obstruction. Transition zone appears to be in the right lower quadrant. No cause is identified. No wall thickening or mesenteric edema. Colon is mostly decompressed with some scattered stool. Appendix is normal. Vascular/Lymphatic: Aortic atherosclerosis. No enlarged abdominal or pelvic lymph nodes. Reproductive: Prostate gland is enlarged, measuring 5.1 cm diameter. Other: No free air or free fluid in the abdomen. Abdominal wall musculature appears intact. Musculoskeletal: Degenerative changes in the spine. Compression of T12 and T10 vertebrae. Nonspecific but likely chronic. IMPRESSION: 1. Small bowel obstruction with transition zone in the right lower quadrant. No cause is identified. 2. Fatty infiltration of the liver. 3. Esophageal hiatal hernia with distended esophagus, likely reflux. 4. Enlarged prostate gland. 5. Aortic atherosclerosis. 6. Nonobstructive stones in both kidneys. 7. Compression of T10 and T12 vertebrae, likely chronic.  Electronically Signed   By: Burman Nieves M.D.   On: 04/30/2018 03:10   Dg Abd 2 Views  Result Date: 04/30/2018 CLINICAL DATA:  Abdominal pain.  Evaluate small bowel obstruction. EXAM: ABDOMEN - 2 VIEW COMPARISON:  CT abdomen and pelvis April 30, 2018 FINDINGS: Nasogastric tube tip projects in mid stomach. Multiple loops of gas-filled small bowel measuring to 3.8 cm. Paucity of large bowel gas. No intra-abdominal mass effect or pathologic calcifications. Soft tissue planes and included osseous structures are nonacute. Old RIGHT rib fractures. IMPRESSION: 1. Nasogastric tube tip projecting in mid stomach. 2. Early versus partial small bowel obstruction. Electronically Signed   By: Awilda Metro M.D.   On: 04/30/2018 13:53     ASSESSMENT AND PLAN:  53 year old male patient with history of GERD, hyperlipidemia, hypertension, CVA, bipolar disorder currently under hospitalist service for bowel obstruction  -Acute small bowel obstruction NG tube with suction IV fluids Hold off on aspirin Plavix patient might need surgery we do not know yet Surgery follow-up Abdominal x-ray  -GERD Continue proton pump inhibitor  -DVT prophylaxis subcu Lovenox daily  -Nausea and vomiting Antiemetics  -Leukocytosis Could be stress-induced Will start on IV Levaquin antibiotic empirically  -Tobacco abuse Tobacco cessation counseled to the patient for 6 minutes Nicotine patch offered  All the records are reviewed and case discussed with Care Management/Social Worker. Management plans discussed with the patient, family and they are in agreement.  CODE STATUS: Full code  DVT Prophylaxis: SCDs  TOTAL TIME TAKING CARE OF THIS PATIENT: 45 minutes.   POSSIBLE D/C IN 3 to 4 DAYS, DEPENDING ON CLINICAL CONDITION.  Ihor Austin M.D on 04/30/2018 at 2:33 PM  Between 7am to 6pm - Pager - [phone]  After 6pm go to [email or web address] - password [email or web address] [facility]  [facility]  Office   [phone]  CC: Primary care physician; [facility]  Note: This dictation was prepared  with Dragon dictation along with smaller phrase technology. Any transcriptional errors that result from this process are unintentional.

## 2018-04-30 NOTE — ED Notes (Signed)
Pt has NG tube on Cont low suction per surgeon order

## 2018-04-30 NOTE — H&P (Addendum)
Timothy Pham is an 53 y.o. male.   Chief Complaint: Vomiting and abdominal pain  Timothy Pham:FYBOFBP [name] is  a pleasant 53 year old Caucasian male with history of multiple medical problems that will be mentioned below who presented to the emergency room with acute onset of vomiting and abdominal distention with associated constipation with no bowel movements for the last 3 days.  He has been having intermittent vomiting and distention for the last week.  He denies any fever or chills.  No bloody vomitus or coffee-ground emesis.  No melena or bright red bleeding per rectum.  He admitted to mild dyspnea without cough or wheezing or hemoptysis.  No other bleeding diathesis.  He denied any chest pain or palpitations.  Upon presentation to the emergency room, blood pressure was 150/92 with otherwise normal vital signs.  Labs were remarkable for significant leukocytosis of 19.9, anion gap of 15 and ALT of 47 with total protein of 8.5 otherwise CMP was within normal.  CT abdomen pelvis as below showed suspected small bowel obstruction with transition point in the right lower quadrant.  Dr. Duanne Pham was contacted and is aware about the patient.  The patient was kept n.p.o. and had an NG tube placed.  He will be admitted to a medical monitored bed for further evaluation and management. Past Medical History:  Diagnosis Date  . Anxiety   . Bipolar 1 disorder (HCC)   . GERD (gastroesophageal reflux disease)   . Hyperlipidemia   . Hypertension   . Nephrolithiasis   . Stroke [facility])    December 2019  .  Peripheral vascular disease with history of right carotid stenosis, status post PCI and stent.  Past Surgical History:  Procedure Laterality Date  . CAROTID ANGIOGRAPHY Bilateral 01/21/2018   Procedure: CAROTID ANGIOGRAPHY;  Surgeon: Renford Dills, MD;  Location: [facility] INVASIVE CV LAB;  Service: Cardiovascular;  Laterality: Bilateral;  He is status post right carotid artery stent per his  report.  Family History  Problem Relation Age of Onset  . Heart disease Mother   . Bipolar disorder Mother   . Heart disease Father   . Diabetes Mellitus II Father   . Obesity Sister   . Heart disease Brother    Social History:  reports that he has been smoking cigarettes. He has been smoking about 1.00 pack per day. He has never used smokeless tobacco. He reports that he does not drink alcohol or use drugs.  Allergies:  Allergies  Allergen Reactions  . Wellbutrin [Bupropion] Itching    Patient thought he was allergic but is not. It is well tolerated    (Not in a hospital admission)   Results for orders placed or performed during the hospital encounter of 04/30/18 (from the past 48 hour(s))  Lipase, blood     Status: None   Collection Time: 04/29/18  9:52 PM  Result Value Ref Range   Lipase 41 11 - 51 U/L    Comment: Performed at [facility], [address] [place], [place] [address]  Comprehensive metabolic panel     Status: Abnormal   Collection Time: 04/29/18  9:52 PM  Result Value Ref Range   Sodium 143 135 - 145 mmol/L   Potassium 4.3 3.5 - 5.1 mmol/L   Chloride 101 98 - 111 mmol/L   CO2 27 22 - 32 mmol/L   Glucose, Bld 150 (H) 70 - 99 mg/dL   BUN 19 6 - 20 mg/dL   Creatinine, Ser 5.27 0.61 - 1.24  mg/dL   Calcium 91.410.2 8.9 - 78.210.3 mg/dL   Total Protein 8.5 (H) 6.5 - 8.1 g/dL   Albumin 4.7 3.5 - 5.0 g/dL   AST 29 15 - 41 U/L   ALT 47 (H) 0 - 44 U/L   Alkaline Phosphatase 109 38 - 126 U/L   Total Bilirubin 0.7 0.3 - 1.2 mg/dL   GFR calc non Af Amer >60 >60 mL/min   GFR calc Af Amer >60 >60 mL/min   Anion gap 15 5 - 15    Comment: Performed at [facility], [address] [place], [place] [address]  CBC     Status: Abnormal   Collection Time: 04/29/18  9:52 PM  Result Value Ref Range   WBC 19.9 (H) 4.0 - 10.5 K/uL   RBC 4.97 4.22 - 5.81 MIL/uL   Hemoglobin 15.5 13.0 - 17.0 g/dL   HCT 30.847.6 65.739.0 - 84.652.0 %   MCV 95.8 80.0 - 100.0 fL   MCH  31.2 26.0 - 34.0 pg   MCHC 32.6 30.0 - 36.0 g/dL   RDW 96.215.0 95.211.5 - 84.115.5 %   Platelets 389 150 - 400 K/uL   nRBC 0.0 0.0 - 0.2 %    Comment: Performed at [facility], [address] [place], [place] [address]   Dg Abdomen 1 View  Result Date: 04/30/2018 CLINICAL DATA:  Abdominal pain for 1 week. Vomiting. EXAM: ABDOMEN - 1 VIEW COMPARISON:  None. FINDINGS: Multiple dilated gas distended small bowel loops with relative decompression of gas-filled colon. Changes likely to represent small bowel obstruction. No radiopaque stones. Degenerative changes in the spine. IMPRESSION: Multiple dilated gas-filled small bowel loops suggesting small bowel obstruction. Electronically Signed   By: Burman NievesWilliam  Stevens M.D.   On: 04/30/2018 00:51   Ct Abdomen Pelvis W Contrast  Result Date: 04/30/2018 CLINICAL DATA:  Generalized abdominal pain and distention. EXAM: CT ABDOMEN AND PELVIS WITH CONTRAST TECHNIQUE: Multidetector CT imaging of the abdomen and pelvis was performed using the standard protocol following bolus administration of intravenous contrast. CONTRAST:  100 mL Omnipaque 300 COMPARISON:  None. FINDINGS: Lower chest: Mild dependent changes in the lung bases. Moderate esophageal hiatal hernia. Esophagus is distended and fluid-filled as is the hiatal hernia. Hepatobiliary: Diffuse fatty infiltration of the liver. Calcified granuloma. Gallbladder and bile ducts are unremarkable. Pancreas: Unremarkable. No pancreatic ductal dilatation or surrounding inflammatory changes. Spleen: Normal in size without focal abnormality. Adrenals/Urinary Tract: Scattered calcifications in both kidneys may be vascular or may represent stones. No hydronephrosis or hydroureter. Nephrograms are symmetrical and homogeneous. Bladder is unremarkable. Stomach/Bowel: Stomach and proximal small bowel are diffusely distended and fluid-filled. Terminal ileum is decompressed. Changes are consistent with mechanical small bowel  obstruction. Transition zone appears to be in the right lower quadrant. No cause is identified. No wall thickening or mesenteric edema. Colon is mostly decompressed with some scattered stool. Appendix is normal. Vascular/Lymphatic: Aortic atherosclerosis. No enlarged abdominal or pelvic lymph nodes. Reproductive: Prostate gland is enlarged, measuring 5.1 cm diameter. Other: No free air or free fluid in the abdomen. Abdominal wall musculature appears intact. Musculoskeletal: Degenerative changes in the spine. Compression of T12 and T10 vertebrae. Nonspecific but likely chronic. IMPRESSION: 1. Small bowel obstruction with transition zone in the right lower quadrant. No cause is identified. 2. Fatty infiltration of the liver. 3. Esophageal hiatal hernia with distended esophagus, likely reflux. 4. Enlarged prostate gland. 5. Aortic atherosclerosis. 6. Nonobstructive stones in both kidneys. 7. Compression of T10 and T12 vertebrae,  likely chronic. Electronically Signed   By: Burman Nieves M.D.   On: 04/30/2018 03:10    ROS:  As per history of present illness. All pertinent systems were reviewed above. Constitutional, HEENT, cardiovascular, respiratory, GI, GU, musculoskeletal, neuro, psychiatric, endocrine, integumentary and hematologic systems were reviewed and are otherwise negative/unremarkable except for positive findings mentioned above in the HPI.    Blood pressure 130/86, pulse 76, temperature 98.2 F (36.8 C), temperature source Oral, resp. rate 14, height 5\' 5"  (1.651 m), weight 74.8 kg, SpO2 97 %. Physical Exam: Generally: Pleasant middle-aged Caucasian male in no acute distress Vital signs per history of present illness. Head - atraumatic, normocephalic.  Pupils - equal, round and reactive to light and accommodation. Extraocular movements are intact. No scleral icterus.  Oropharynx - moist mucous membranes and tongue. No pharyngeal erythema or exudate.  Nose: NG tube in place with minimal  brownish aspirate. Neck - supple. No JVD. Carotid pulses 2+ bilaterally. No carotid bruits. No palpable thyromegaly or lymphadenopathy. Cardiovascular - regular rate and rhythm. Normal S1 and S2. No murmurs, gallops or rubs.  Lungs - clear to auscultation bilaterally.  Abdomen - soft, mildly distended and nontender. Positive bowel sounds. No palpable organomegaly or masses.  Extremities - no pitting edema, clubbing or cyanosis.  Neuro - grossly non-focal. Skin - no rashes. GU and rectal exam - deferred.       Assessment/Plan #1.  Small bowel obstruction: The patient will be admitted medically monitored bed.  He will be kept n.p.o.  NG tube will be placed to intermittent low suction.  Surgery consult by Dr. Lady Gary was requested.  She is aware about the patient.  We will repeat two-view abdomen x-ray this a.m.  His aspirin and Plavix will be held off.  2.  Gout.  Allopurinol will be continued  3.  Bipolar disorder.  We will continue his psychiatric medications.  4.  GERD.  The patient will be placed on IV PPI therapy  5.  Hypertension.  Amlodipine will be resumed.  6.  DVT prophylaxis.  This will be provided with subcutaneous Lovenox.   The plan of care was discussed in details with the patient (and family). I answered all questions. The patient agreed to proceed with the above mentioned plan. Further management will depend upon hospital course.  This dictation has been created by [employer].  Any transcription errors that resulted from this process are unintentional.         Hannah Beat, MD 04/30/2018, 1:38 PM

## 2018-04-30 NOTE — ED Notes (Signed)
ED TO INPATIENT HANDOFF REPORT  ED Nurse Name and Phone #: [phone]  S Name/Age/Gender Timothy Pham 53 y.o. male Room/Bed: ED35A/ED35A  Code Status   Code Status: Prior  Home/SNF/Other Home Patient oriented to: self, place, time and situation Is this baseline? Yes   Triage Complete: Triage complete  Chief Complaint EMS Abd pain  Triage Note Patient coming in for abd pain x 1week but got really bad to today per patient. States he had 2 episodes of vomiting, states he thinks he is constipated.   Allergies Allergies  Allergen Reactions  . Wellbutrin [Bupropion] Itching    Patient thought he was allergic but is not. It is well tolerated    Level of Care/Admitting Diagnosis ED Disposition    ED Disposition Condition Comment   Admit  Hospital Area: [facility] [[id number]]  Level of Care: Med-Surg [16]  Diagnosis: Small bowel obstruction [facility]) [[id number]]  Admitting Physician: Hannah Beat [[id number]]  Attending Physician: Hannah Beat [[id number]]  Estimated length of stay: past midnight tomorrow  Certification:: I certify this patient will need inpatient services for at least 2 midnights  PT Class (Do Not Modify): Inpatient [101]  PT Acc Code (Do Not Modify): Private [1]       B Medical/Surgery History Past Medical History:  Diagnosis Date  . Anxiety   . Bipolar 1 disorder (HCC)   . GERD (gastroesophageal reflux disease)   . Hyperlipidemia   . Hypertension   . Nephrolithiasis   . Stroke [facility])    December 2019   Past Surgical History:  Procedure Laterality Date  . CAROTID ANGIOGRAPHY Bilateral 01/21/2018   Procedure: CAROTID ANGIOGRAPHY;  Surgeon: Renford Dills, MD;  Location: [facility] INVASIVE CV LAB;  Service: Cardiovascular;  Laterality: Bilateral;     A IV Location/Drains/Wounds Patient Lines/Drains/Airways Status   Active Line/Drains/Airways    Name:   Placement date:   Placement time:   Site:   Days:   Peripheral IV 04/30/18 Right  Antecubital   04/30/18    0204    Antecubital   less than 1   NG/OG Tube Nasogastric 16 Fr. Right nare Aucultation Measured external length of tube 57 cm   04/30/18    0430    Right nare   less than 1          Intake/Output Last 24 hours No intake or output data in the 24 hours ending 04/30/18 1320  Labs/Imaging Results for orders placed or performed during the hospital encounter of 04/30/18 (from the past 48 hour(s))  Lipase, blood     Status: None   Collection Time: 04/29/18  9:52 PM  Result Value Ref Range   Lipase 41 11 - 51 U/L    Comment: Performed at [facility], [address] [place], [place] [address]  Comprehensive metabolic panel     Status: Abnormal   Collection Time: 04/29/18  9:52 PM  Result Value Ref Range   Sodium 143 135 - 145 mmol/L   Potassium 4.3 3.5 - 5.1 mmol/L   Chloride 101 98 - 111 mmol/L   CO2 27 22 - 32 mmol/L   Glucose, Bld 150 (H) 70 - 99 mg/dL   BUN 19 6 - 20 mg/dL   Creatinine, Ser 6.23 0.61 - 1.24 mg/dL   Calcium 76.2 8.9 - 83.1 mg/dL   Total Protein 8.5 (H) 6.5 - 8.1 g/dL   Albumin 4.7 3.5 - 5.0 g/dL   AST 29 15 - 41 U/L  ALT 47 (H) 0 - 44 U/L   Alkaline Phosphatase 109 38 - 126 U/L   Total Bilirubin 0.7 0.3 - 1.2 mg/dL   GFR calc non Af Amer >60 >60 mL/min   GFR calc Af Amer >60 >60 mL/min   Anion gap 15 5 - 15    Comment: Performed at [facility], [address] [place], [place] [address]  CBC     Status: Abnormal   Collection Time: 04/29/18  9:52 PM  Result Value Ref Range   WBC 19.9 (H) 4.0 - 10.5 K/uL   RBC 4.97 4.22 - 5.81 MIL/uL   Hemoglobin 15.5 13.0 - 17.0 g/dL   HCT 58.8 50.2 - 77.4 %   MCV 95.8 80.0 - 100.0 fL   MCH 31.2 26.0 - 34.0 pg   MCHC 32.6 30.0 - 36.0 g/dL   RDW 12.8 78.6 - 76.7 %   Platelets 389 150 - 400 K/uL   nRBC 0.0 0.0 - 0.2 %    Comment: Performed at [facility], [address] [place], [place] [address]   Dg Abdomen 1 View  Result Date: 04/30/2018 CLINICAL DATA:   Abdominal pain for 1 week. Vomiting. EXAM: ABDOMEN - 1 VIEW COMPARISON:  None. FINDINGS: Multiple dilated gas distended small bowel loops with relative decompression of gas-filled colon. Changes likely to represent small bowel obstruction. No radiopaque stones. Degenerative changes in the spine. IMPRESSION: Multiple dilated gas-filled small bowel loops suggesting small bowel obstruction. Electronically Signed   By: Burman Nieves M.D.   On: 04/30/2018 00:51   Ct Abdomen Pelvis W Contrast  Result Date: 04/30/2018 CLINICAL DATA:  Generalized abdominal pain and distention. EXAM: CT ABDOMEN AND PELVIS WITH CONTRAST TECHNIQUE: Multidetector CT imaging of the abdomen and pelvis was performed using the standard protocol following bolus administration of intravenous contrast. CONTRAST:  100 mL Omnipaque 300 COMPARISON:  None. FINDINGS: Lower chest: Mild dependent changes in the lung bases. Moderate esophageal hiatal hernia. Esophagus is distended and fluid-filled as is the hiatal hernia. Hepatobiliary: Diffuse fatty infiltration of the liver. Calcified granuloma. Gallbladder and bile ducts are unremarkable. Pancreas: Unremarkable. No pancreatic ductal dilatation or surrounding inflammatory changes. Spleen: Normal in size without focal abnormality. Adrenals/Urinary Tract: Scattered calcifications in both kidneys may be vascular or may represent stones. No hydronephrosis or hydroureter. Nephrograms are symmetrical and homogeneous. Bladder is unremarkable. Stomach/Bowel: Stomach and proximal small bowel are diffusely distended and fluid-filled. Terminal ileum is decompressed. Changes are consistent with mechanical small bowel obstruction. Transition zone appears to be in the right lower quadrant. No cause is identified. No wall thickening or mesenteric edema. Colon is mostly decompressed with some scattered stool. Appendix is normal. Vascular/Lymphatic: Aortic atherosclerosis. No enlarged abdominal or pelvic lymph nodes.  Reproductive: Prostate gland is enlarged, measuring 5.1 cm diameter. Other: No free air or free fluid in the abdomen. Abdominal wall musculature appears intact. Musculoskeletal: Degenerative changes in the spine. Compression of T12 and T10 vertebrae. Nonspecific but likely chronic. IMPRESSION: 1. Small bowel obstruction with transition zone in the right lower quadrant. No cause is identified. 2. Fatty infiltration of the liver. 3. Esophageal hiatal hernia with distended esophagus, likely reflux. 4. Enlarged prostate gland. 5. Aortic atherosclerosis. 6. Nonobstructive stones in both kidneys. 7. Compression of T10 and T12 vertebrae, likely chronic. Electronically Signed   By: Burman Nieves M.D.   On: 04/30/2018 03:10    Pending Labs [employer] (From admission, onward)    Start     Ordered  04/29/18 2150  Urinalysis, Complete w Microscopic  ONCE - STAT,   STAT     04/29/18 2149          Vitals/Pain Today's Vitals   04/30/18 0545 04/30/18 0600 04/30/18 0630 04/30/18 0700  BP:  124/80 126/79 130/86  Pulse: 79 83  76  Resp: Temp:      TempSrc:      SpO2: 91% 97%  97%  Weight:      Height:      PainSc:        Isolation Precautions No active isolations  Medications Medications  ondansetron (ZOFRAN) 4 MG/2ML injection (  Given During Downtime 04/30/18 0405)  morphine 4 MG/ML injection (  Given During Downtime 04/30/18 0405)  iohexol (OMNIPAQUE) 300 MG/ML solution 100 mL (100 mLs Intravenous Contrast Given 04/30/18 0520)    Mobility walks Low fall risk   Focused Assessments    R Recommendations: See Admitting Provider Note  Report given to:   Additional Notes:

## 2018-04-30 NOTE — ED Provider Notes (Signed)
[facility] Emergency Department Provider Note    First MD Initiated Contact with Patient 04/30/18 [phone]     (approximate)  I have reviewed the triage vital signs and the nursing notes.   HISTORY  Chief Complaint Abdominal Pain    HPI Timothy Pham is a 53 y.o. male below listed past medical history presents the ER for evaluation of abdominal distention nausea and vomiting.  Denies any pain but feels like he is constipated.  Is not been able to keep anything down for the past day.  Denies any fevers.  Has had several episodes of nonbilious nonbloody emesis.  Denies any previous surgeries.    Past Medical History:  Diagnosis Date   Anxiety    Bipolar 1 disorder (HCC)    GERD (gastroesophageal reflux disease)    Hyperlipidemia    Hypertension    Nephrolithiasis    Stroke [facility])    December 2019   Family History  Problem Relation Age of Onset   Heart disease Mother    Bipolar disorder Mother    Heart disease Father    Diabetes Mellitus II Father    Obesity Sister    Heart disease Brother    Past Surgical History:  Procedure Laterality Date   CAROTID ANGIOGRAPHY Bilateral 01/21/2018   Procedure: CAROTID ANGIOGRAPHY;  Surgeon: Renford Dills, MD;  Location: [facility] INVASIVE CV LAB;  Service: Cardiovascular;  Laterality: Bilateral;   Patient Active Problem List   Diagnosis Date Noted   GERD (gastroesophageal reflux disease) 01/28/2018   Hyperlipidemia 01/28/2018   Carotid stenosis, symptomatic, with infarction (HCC) 01/28/2018   Headache 01/20/2018      Prior to Admission medications   Medication Sig Start Date End Date Taking? Authorizing Provider  acetaminophen-codeine (TYLENOL #3) 300-30 MG tablet Take 1 tablet by mouth every 6 (six) hours as needed for moderate pain. 04/19/18   Tommi Rumps, PA-C  ARIPiprazole (ABILIFY) 10 MG tablet Take 10 mg by mouth daily.    [provider]  atorvastatin (LIPITOR) 40 MG  tablet Take 1 tablet (40 mg total) by mouth daily at 6 PM. 03/27/18   Georgiana Spinner, NP  clopidogrel (PLAVIX) 75 MG tablet Take 1 tablet (75 mg total) by mouth daily. 03/27/18   Georgiana Spinner, NP  lamoTRIgine (LAMICTAL) 150 MG tablet Take 150 mg by mouth daily.    [provider]  loratadine (CLARITIN) 10 MG tablet Take 10 mg by mouth daily. 09/09/16   [provider]  lovastatin (MEVACOR) 20 MG tablet Take by mouth. 12/11/16   [provider]  omeprazole (PRILOSEC) 20 MG capsule Take 20 mg by mouth 2 (two) times daily. 09/09/16   [provider]  predniSONE (DELTASONE) 10 MG tablet Take 6 tablets  today, on day 2 take 5 tablets, day 3 take 4 tablets, day 4 take 3 tablets, day 5 take  2 tablets and 1 tablet the last day 04/19/18   Tommi Rumps, PA-C    Allergies Wellbutrin [bupropion]    Social History Social History   Tobacco Use   Smoking status: Current Every Day Smoker    Packs/day: 1.00    Types: Cigarettes   Smokeless tobacco: Never Used  Substance Use Topics   Alcohol use: No   Drug use: No    Review of Systems Patient denies headaches, rhinorrhea, blurry vision, numbness, shortness of breath, chest pain, edema, cough, abdominal pain, nausea, vomiting, diarrhea, dysuria, fevers, rashes or hallucinations unless otherwise stated  above in HPI. ____________________________________________   PHYSICAL EXAM:  VITAL SIGNS: Vitals:   04/29/18 2145  BP: (!) 171/86  Pulse: 92  Resp: 18  Temp: 98.2 F (36.8 C)  SpO2: 94%    Constitutional: Alert and oriented.  Eyes: Conjunctivae are normal.  Head: Atraumatic. Nose: No congestion/rhinnorhea. Mouth/Throat: Mucous membranes are moist.   Neck: No stridor. Painless ROM.  Cardiovascular: Normal rate, regular rhythm. Grossly normal heart sounds.  Good peripheral circulation. Respiratory: Normal respiratory effort.  No retractions. Lungs CTAB. Gastrointestinal: Soft and nontender. +++  distention. No abdominal bruits. No CVA tenderness. Genitourinary:  Musculoskeletal: No lower extremity tenderness nor edema.  No joint effusions. Neurologic:  Normal speech and language. No gross focal neurologic deficits are appreciated. No facial droop Skin:  Skin is warm, dry and intact. No rash noted. Psychiatric: Mood and affect are normal. Speech and behavior are normal.  ____________________________________________   LABS (all labs ordered are listed, but only abnormal results are displayed)  Results for orders placed or performed during the hospital encounter of 04/30/18 (from the past 24 hour(s))  Lipase, blood     Status: None   Collection Time: 04/29/18  9:52 PM  Result Value Ref Range   Lipase 41 11 - 51 U/L  Comprehensive metabolic panel     Status: Abnormal   Collection Time: 04/29/18  9:52 PM  Result Value Ref Range   Sodium 143 135 - 145 mmol/L   Potassium 4.3 3.5 - 5.1 mmol/L   Chloride 101 98 - 111 mmol/L   CO2 27 22 - 32 mmol/L   Glucose, Bld 150 (H) 70 - 99 mg/dL   BUN 19 6 - 20 mg/dL   Creatinine, Ser 1.51 0.61 - 1.24 mg/dL   Calcium 76.1 8.9 - 60.7 mg/dL   Total Protein 8.5 (H) 6.5 - 8.1 g/dL   Albumin 4.7 3.5 - 5.0 g/dL   AST 29 15 - 41 U/L   ALT 47 (H) 0 - 44 U/L   Alkaline Phosphatase 109 38 - 126 U/L   Total Bilirubin 0.7 0.3 - 1.2 mg/dL   GFR calc non Af Amer >60 >60 mL/min   GFR calc Af Amer >60 >60 mL/min   Anion gap 15 5 - 15  CBC     Status: Abnormal   Collection Time: 04/29/18  9:52 PM  Result Value Ref Range   WBC 19.9 (H) 4.0 - 10.5 K/uL   RBC 4.97 4.22 - 5.81 MIL/uL   Hemoglobin 15.5 13.0 - 17.0 g/dL   HCT 37.1 06.2 - 69.4 %   MCV 95.8 80.0 - 100.0 fL   MCH 31.2 26.0 - 34.0 pg   MCHC 32.6 30.0 - 36.0 g/dL   RDW 85.4 62.7 - 03.5 %   Platelets 389 150 - 400 K/uL   nRBC 0.0 0.0 - 0.2 %   ____________________________________________ ____________________________________________  RADIOLOGY  I personally reviewed all radiographic  images ordered to evaluate for the above acute complaints and reviewed radiology reports and findings.  These findings were personally discussed with the patient.  Please see medical record for radiology report.  ____________________________________________   PROCEDURES  Procedure(s) performed:  Procedures    Critical Care performed: no ____________________________________________   INITIAL IMPRESSION / ASSESSMENT AND PLAN / ED COURSE  Pertinent labs & imaging results that were available during my care of the patient were reviewed by me and considered in my medical decision making (see chart for details).   DDX: sbo, enteritis, gastritis, diverticulitis, mass  Timothy Pham is a 53 y.o. who presents to the ED with symptoms as described above.  Patient with evidence of leukocytosis and CT imaging showing evidence of SBO with transition point in the right lower quadrant.  I discussed the case in consultation with Dr. Lady Gary of general surgery.  Agrees with NG tube placement and admission to the hospital.  Have discussed with the patient and available family all diagnostics and treatments performed thus far and all questions were answered to the best of my ability. The patient demonstrates understanding and agreement with plan.       As part of my medical decision making, I reviewed the following data within the electronic MEDICAL RECORD NUMBER Nursing notes reviewed and incorporated, Labs reviewed, notes from prior ED visits and [place] Controlled Substance Database   ____________________________________________   FINAL CLINICAL IMPRESSION(S) / ED DIAGNOSES  Final diagnoses:  Small bowel obstruction (HCC)      NEW MEDICATIONS STARTED DURING THIS VISIT:  New Prescriptions   No medications on file     Note:  This document was prepared using Dragon voice recognition software and may include unintentional dictation errors.    Willy Eddy, MD 04/30/18 [phone]

## 2018-04-30 NOTE — Progress Notes (Signed)
Advanced care plan. Purpose of the Encounter: CODE STATUS Parties in Attendance: Patient Patient's Decision Capacity: Good Subjective/Patient's story: Timothy Pham is  a pleasant 53 year old Caucasian male with history of multiple medical problems that will be mentioned below who presented to the emergency room with acute onset of vomiting and abdominal distention with associated constipation with no bowel movements for the last 3 days. He has been having intermittent vomiting and distention for the last week. He denies any fever or chills. No bloody vomitus or coffee-ground emesis. No melena or bright red bleeding per rectum. He admitted to mild dyspnea without cough or wheezing or hemoptysis. No other bleeding diathesis. He denied any chest pain or palpitations. Objective/Medical story Patient admitted for bowel obstruction.  NG tube with suction and surgery consult.  Serial abdominal x-rays.  IV fluids. Goals of care determination:  Advance care directives goals of care and treatment plan discussed. Patient wants everything done which includes CPR, intubation and ventilator if the need arises. CODE STATUS: Full code Time spent discussing advanced care planning: 16 minutes

## 2018-05-01 ENCOUNTER — Inpatient Hospital Stay: Payer: BLUE CROSS/BLUE SHIELD

## 2018-05-01 LAB — BASIC METABOLIC PANEL
Anion gap: 8 (ref 5–15)
BUN: 14 mg/dL (ref 6–20)
CALCIUM: 8.6 mg/dL — AB (ref 8.9–10.3)
CO2: 24 mmol/L (ref 22–32)
Chloride: 108 mmol/L (ref 98–111)
Creatinine, Ser: 0.69 mg/dL (ref 0.61–1.24)
GFR calc Af Amer: >60 mL/min (ref >60)
Glucose, Bld: 83 mg/dL (ref 70–99)
Potassium: 3.9 mmol/L (ref 3.5–5.1)
Sodium: 140 mmol/L (ref 135–145)

## 2018-05-01 LAB — CBC WITH DIFFERENTIAL/PLATELET
Abs Immature Granulocytes: 0.07 10*3/uL (ref 0.00–0.07)
BASOS ABS: 0.1 10*3/uL (ref 0.0–0.1)
Basophils Relative: 1 %
EOS ABS: 0.2 10*3/uL (ref 0.0–0.5)
Eosinophils Relative: 2 %
HCT: 40.4 % (ref 39.0–52.0)
Hemoglobin: 12.9 g/dL — ABNORMAL LOW (ref 13.0–17.0)
Immature Granulocytes: 1 %
Lymphocytes Relative: 18 %
Lymphs Abs: 2 10*3/uL (ref 0.7–4.0)
MCH: 31.1 pg (ref 26.0–34.0)
MCHC: 31.9 g/dL (ref 30.0–36.0)
MCV: 97.3 fL (ref 80.0–100.0)
Monocytes Absolute: 0.9 10*3/uL (ref 0.1–1.0)
Monocytes Relative: 9 %
NEUTROS PCT: 69 %
Neutro Abs: 7.6 10*3/uL (ref 1.7–7.7)
Platelets: 316 10*3/uL (ref 150–400)
RBC: 4.15 MIL/uL — ABNORMAL LOW (ref 4.22–5.81)
RDW: 15.2 % (ref 11.5–15.5)
WBC: 10.8 10*3/uL — ABNORMAL HIGH (ref 4.0–10.5)
nRBC: 0 % (ref 0.0–0.2)

## 2018-05-01 MED ORDER — CLOPIDOGREL BISULFATE 75 MG PO TABS: 75.0000 mg | ORAL_TABLET | Freq: Every day | ORAL | Status: DC

## 2018-05-01 MED ORDER — ASPIRIN EC 81 MG PO TBEC: 81.0000 mg | DELAYED_RELEASE_TABLET | Freq: Every day | ORAL | Status: DC

## 2018-05-01 NOTE — Progress Notes (Signed)
Discharge instructions reviewed with patient including followup visits and new medications.  Understanding was verbalized and all questions were answered.  IV removed without complication; patient tolerated well.  Patient discharged home via wheelchair in stable condition escorted by volunteer staff.  

## 2018-05-01 NOTE — Progress Notes (Addendum)
[facility] PROGRESS NOTE (cpt [phone])  Hospital Day(s): 1.   Post op day(s):  Marland Kitchen   Interval History: Patient seen and examined, no acute events or new complaints overnight. Patient reports that he continues to feel significantly better. No complaints of fever, chills, nausea, emesis, abdominal pain, or distention. NGT with minimal output. Mobilizing well. He continues to endorse flatus.   Review of Systems:  Constitutional: denies fever, chills  Gastrointestinal: denies abdominal pain, N/V, or diarrhea/and bowel function as per interval history Genitourinary: denies burning with urination or urinary frequency   Vital signs in last 24 hours: [min-max] current  Temp:  [97.6 F (36.4 C)-97.7 F (36.5 C)] 97.6 F (36.4 C) (03/13 0430) Pulse Rate:  [58-70] 58 (03/13 0430) Resp:  [16-19] 18 (03/13 0430) BP: (99-138)/(59-85) 99/59 (03/13 0430) SpO2:  [94 %-95 %] 95 % (03/13 0430)     Height: 5\' 5"  (165.1 cm) Weight: 74.8 kg BMI (Calculated): 27.46   Intake/Output this shift:  No intake/output data recorded.     Physical Exam:  Constitutional: alert, cooperative and no distress  HENT: normocephalic without obvious abnormality, NGT in place  Respiratory: breathing non-labored at rest  Cardiovascular: regular rate and sinus rhythm  Gastrointestinal: soft, non-tender, and non-distended   Labs:  CBC Latest Ref Rng & Units 05/01/2018 04/29/2018 01/22/2018  WBC 4.0 - 10.5 K/uL 10.8(H) 19.9(H) 8.4  Hemoglobin 13.0 - 17.0 g/dL 12.9(L) 15.5 12.3(L)  Hematocrit 39.0 - 52.0 % 40.4 47.6 37.4(L)  Platelets 150 - 400 K/uL 316 389 259   CMP Latest Ref Rng & Units 05/01/2018 04/30/2018 04/29/2018  Glucose 70 - 99 mg/dL 83 96 832(P)  BUN 6 - 20 mg/dL 14 15 19   Creatinine 0.61 - 1.24 mg/dL 4.98 2.64 1.58  Sodium 135 - 145 mmol/L 140 140 143  Potassium 3.5 - 5.1 mmol/L 3.9 4.0 4.3  Chloride 98 - 111 mmol/L 108 105 101  CO2 22 - 32 mmol/L 24 25 27   Calcium 8.9 - 10.3 mg/dL  3.0(N) 4.0(H) 68.0  Total Protein 6.5 - 8.1 g/dL - - 8.5(H)  Total Bilirubin 0.3 - 1.2 mg/dL - - 0.7  Alkaline Phos 38 - 126 U/L - - 109  AST 15 - 41 U/L - - 29  ALT 0 - 44 U/L - - 47(H)     Imaging studies:   KUB (05/01/2018) personally reviewed and radiologist report reviewed:  IMPRESSION: Slightly decreased small bowel distension.   Assessment/Plan: (ICD-10's: K35.60) 53 y.o. male with significantly improved leukocytosis and continued signs of bowel function with small bowel obstruction with transition point in RLQ however given lack of previous surgical history enteritis remains a possible cause rather than adhesive disease, complicated by pertinent comorbidities including anxiety/bipolar, HLD, HTN, history of CVA (01/2018), obesity, current tobacco abuse (smoking).   - Advance to clear liquids (advance as tolerates), wean IVF              - NGT out             - Monitor abdominal examination, on-going bowel function             - Trend leukocytosis             - Pain control prn, antiemetics prn             - No indication for emergent surgical intervention at this time. He understands if he clinically deteriorates or does not respond he may require intervention             -  Medical management of comorbidities per primary team.     - Discharge planning: If patient tolerates a diet and advancement, okay to discharge tomorrow morning. No need for surgical follow up. General surgery will sign off.   All of the above findings and recommendations were discussed with the patient, and the medical team, and all of patient's questions were answered to her expressed satisfaction.   -- Lynden Oxford, PA-C [facility] 05/01/2018, 7:49 AM [phone] M-F: 7am - 4pm  I saw and evaluated the patient.  I agree with the above documentation, exam, and plan, which I have edited where appropriate. Duanne Guess  9:50 AM

## 2018-05-01 NOTE — Discharge Summary (Signed)
[facility] - [place] at [facility]   PATIENT NAME: Timothy Pham    MR#:  [id number]  DATE OF BIRTH:  1965/08/17  DATE OF ADMISSION:  04/30/2018 ADMITTING PHYSICIAN: Barbaraann Rondo, MD  DATE OF DISCHARGE: 05/01/2018  PRIMARY CARE PHYSICIAN: [place], [facility]   ADMISSION DIAGNOSIS:  Small bowel obstruction (HCC) [K56.609]  DISCHARGE DIAGNOSIS:  Active Problems:   Small bowel obstruction (HCC) Acute enteritis Abdominal pain Leukocytosis Nausea and vomiting  SECONDARY DIAGNOSIS:   Past Medical History:  Diagnosis Date  . Anxiety   . Bipolar 1 disorder (HCC)   . GERD (gastroesophageal reflux disease)   . Hyperlipidemia   . Hypertension   . Nephrolithiasis   . Stroke [facility])    December 2019     ADMITTING HISTORY Clever Shor is  a pleasant 53 year old Caucasian male with history of multiple medical problems that will be mentioned below who presented to the emergency room with acute onset of vomiting and abdominal distention with associated constipation with no bowel movements for the last 3 days. He has been having intermittent vomiting and distention for the last week. He denies any fever or chills. No bloody vomitus or coffee-ground emesis. No melena or bright red bleeding per rectum. He admitted to mild dyspnea without cough or wheezing or hemoptysis. No other bleeding diathesis. He denied any chest pain or palpitations. Upon presentation to the emergency room, blood pressure was 150/92 with otherwise normal vital signs.  Labs were remarkable for significant leukocytosis of 19.9, anion gap of 15 and ALT of 47 with total protein of 8.5 otherwise CMP was within normal.  CT abdomen pelvis as below showed suspected small bowel obstruction with transition point in the right lower quadrant.  Dr. Duanne Guess was contacted and is aware about the patient. The patient was kept n.p.o. and had an NG tube placed.  He will be admitted to a medical  monitored bed for further evaluation and management.  HOSPITAL COURSE:  Patient was admitted to medical floor.  NG tube was placed to suction serial abdominal x-rays were done.  Surgery consultation was done.  Abdominal pain, distention resolved.  Nausea and vomiting resolved.  Patient was hydrated with IV fluids.  Bowel obstruction resolved.  NG tube with suction was removed.  Patient was started on liquid diet and advance to oral soft diet.  He had good bowel movement.  Leukocytosis completely resolved.  Patient will be discharged home and follow-up with primary care physician in the clinic.  CONSULTS OBTAINED:  Treatment Team:  Duanne Guess, MD  DRUG ALLERGIES:   Allergies  Allergen Reactions  . Wellbutrin [Bupropion] Itching    Patient thought he was allergic but is not. It is well tolerated    DISCHARGE MEDICATIONS:   Allergies as of 05/01/2018      Reactions   Wellbutrin [bupropion] Itching   Patient thought he was allergic but is not. It is well tolerated      Medication List    STOP taking these medications   acetaminophen-codeine 300-30 MG tablet Commonly known as:  TYLENOL #3   predniSONE 10 MG tablet Commonly known as:  DELTASONE     TAKE these medications   allopurinol 100 MG tablet Commonly known as:  ZYLOPRIM Take 100 mg by mouth daily.   amLODipine 5 MG tablet Commonly known as:  NORVASC Take 5 mg by mouth daily.   aspirin EC 81 MG tablet Take 81 mg by mouth daily.   atorvastatin 40 MG  tablet Commonly known as:  LIPITOR Take 1 tablet (40 mg total) by mouth daily at 6 PM.   buPROPion 75 MG tablet Commonly known as:  WELLBUTRIN Take 75 mg by mouth daily.   clopidogrel 75 MG tablet Commonly known as:  PLAVIX Take 1 tablet (75 mg total) by mouth daily.   loratadine 10 MG tablet Commonly known as:  CLARITIN Take 10 mg by mouth daily.   omeprazole 20 MG capsule Commonly known as:  PRILOSEC Take 20 mg by mouth 2 (two) times daily.        Today  Patient seen today No complaints No abdominal pain No nausea vomiting Tolerated oral soft diet well Had bowel movement Feels good Hemodynamically stable VITAL SIGNS:  Blood pressure 116/62, pulse 64, temperature 97.9 F (36.6 C), temperature source Oral, resp. rate 18, height 5\' 5"  (1.651 m), weight 74.8 kg, SpO2 98 %.  I/O:    Intake/Output Summary (Last 24 hours) at 05/01/2018 1422 Last data filed at 05/01/2018 1138 Gross per 24 hour  Intake 2744.06 ml  Output 1200 ml  Net 1544.06 ml    PHYSICAL EXAMINATION:  Physical Exam  GENERAL:  53 y.o.-year-old patient lying in the bed with no acute distress.  LUNGS: Normal breath sounds bilaterally, no wheezing, rales,rhonchi or crepitation. No use of accessory muscles of respiration.  CARDIOVASCULAR: S1, S2 normal. No murmurs, rubs, or gallops.  ABDOMEN: Soft, non-tender, non-distended. Bowel sounds present. No organomegaly or mass.  NEUROLOGIC: Moves all 4 extremities. PSYCHIATRIC: The patient is alert and oriented x 3.  SKIN: No obvious rash, lesion, or ulcer.   DATA REVIEW:   CBC Recent Labs  Lab 05/01/18 0458  WBC 10.8*  HGB 12.9*  HCT 40.4  PLT 316    Chemistries  Recent Labs  Lab 04/29/18 2152  05/01/18 0458  NA 143   < > 140  K 4.3   < > 3.9  CL 101   < > 108  CO2 27   < > 24  GLUCOSE 150*   < > 83  BUN 19   < > 14  CREATININE 1.11   < > 0.69  CALCIUM 10.2   < > 8.6*  AST 29  --   --   ALT 47*  --   --   ALKPHOS 109  --   --   BILITOT 0.7  --   --    < > = values in this interval not displayed.    Cardiac Enzymes No results for input(s): TROPONINI in the last 168 hours.  Microbiology Results  Results for orders placed or performed during the hospital encounter of 01/19/18  MRSA PCR Screening     Status: None   Collection Time: 01/21/18  6:28 PM  Result Value Ref Range Status   MRSA by PCR NEGATIVE NEGATIVE Final    Comment:        The GeneXpert MRSA Assay (FDA approved for NASAL  specimens only), is one component of a comprehensive MRSA colonization surveillance program. It is not intended to diagnose MRSA infection nor to guide or monitor treatment for MRSA infections. Performed at [facility], [address] [place], [place] [address]     RADIOLOGY:  Cathleen CortiDg Abd 1 View  Result Date: 05/01/2018 CLINICAL DATA:  Follow-up small bowel obstruction. EXAM: ABDOMEN - 1 VIEW COMPARISON:  04/30/2018 FINDINGS: An NG tube is identified with tip overlying the mid stomach. Dilated small bowel loops have slightly decreased in caliber. No suspicious  calcifications. IMPRESSION: Slightly decreased small bowel distension. Electronically Signed   By: Harmon Pier M.D.   On: 05/01/2018 08:54   Dg Abdomen 1 View  Result Date: 04/30/2018 CLINICAL DATA:  Abdominal pain for 1 week. Vomiting. EXAM: ABDOMEN - 1 VIEW COMPARISON:  None. FINDINGS: Multiple dilated gas distended small bowel loops with relative decompression of gas-filled colon. Changes likely to represent small bowel obstruction. No radiopaque stones. Degenerative changes in the spine. IMPRESSION: Multiple dilated gas-filled small bowel loops suggesting small bowel obstruction. Electronically Signed   By: Burman Nieves M.D.   On: 04/30/2018 00:51   Ct Abdomen Pelvis W Contrast  Result Date: 04/30/2018 CLINICAL DATA:  Generalized abdominal pain and distention. EXAM: CT ABDOMEN AND PELVIS WITH CONTRAST TECHNIQUE: Multidetector CT imaging of the abdomen and pelvis was performed using the standard protocol following bolus administration of intravenous contrast. CONTRAST:  100 mL Omnipaque 300 COMPARISON:  None. FINDINGS: Lower chest: Mild dependent changes in the lung bases. Moderate esophageal hiatal hernia. Esophagus is distended and fluid-filled as is the hiatal hernia. Hepatobiliary: Diffuse fatty infiltration of the liver. Calcified granuloma. Gallbladder and bile ducts are unremarkable. Pancreas: Unremarkable. No  pancreatic ductal dilatation or surrounding inflammatory changes. Spleen: Normal in size without focal abnormality. Adrenals/Urinary Tract: Scattered calcifications in both kidneys may be vascular or may represent stones. No hydronephrosis or hydroureter. Nephrograms are symmetrical and homogeneous. Bladder is unremarkable. Stomach/Bowel: Stomach and proximal small bowel are diffusely distended and fluid-filled. Terminal ileum is decompressed. Changes are consistent with mechanical small bowel obstruction. Transition zone appears to be in the right lower quadrant. No cause is identified. No wall thickening or mesenteric edema. Colon is mostly decompressed with some scattered stool. Appendix is normal. Vascular/Lymphatic: Aortic atherosclerosis. No enlarged abdominal or pelvic lymph nodes. Reproductive: Prostate gland is enlarged, measuring 5.1 cm diameter. Other: No free air or free fluid in the abdomen. Abdominal wall musculature appears intact. Musculoskeletal: Degenerative changes in the spine. Compression of T12 and T10 vertebrae. Nonspecific but likely chronic. IMPRESSION: 1. Small bowel obstruction with transition zone in the right lower quadrant. No cause is identified. 2. Fatty infiltration of the liver. 3. Esophageal hiatal hernia with distended esophagus, likely reflux. 4. Enlarged prostate gland. 5. Aortic atherosclerosis. 6. Nonobstructive stones in both kidneys. 7. Compression of T10 and T12 vertebrae, likely chronic. Electronically Signed   By: Burman Nieves M.D.   On: 04/30/2018 03:10   Dg Abd 2 Views  Result Date: 04/30/2018 CLINICAL DATA:  Abdominal pain.  Evaluate small bowel obstruction. EXAM: ABDOMEN - 2 VIEW COMPARISON:  CT abdomen and pelvis April 30, 2018 FINDINGS: Nasogastric tube tip projects in mid stomach. Multiple loops of gas-filled small bowel measuring to 3.8 cm. Paucity of large bowel gas. No intra-abdominal mass effect or pathologic calcifications. Soft tissue planes and  included osseous structures are nonacute. Old RIGHT rib fractures. IMPRESSION: 1. Nasogastric tube tip projecting in mid stomach. 2. Early versus partial small bowel obstruction. Electronically Signed   By: Awilda Metro M.D.   On: 04/30/2018 13:53    Follow up with PCP in 1 week.  Management plans discussed with the patient, family and they are in agreement.  CODE STATUS: Full code Code Status History    Date Active Date Inactive Code Status Order ID Comments User Context   01/20/2018 0546 01/22/2018 1453 DNR [id number]  Arnaldo Natal, MD Inpatient   01/20/2018 0433 01/20/2018 0546 Full Code [id number]  Arnaldo Natal, MD ED    Questions for  Most Recent Historical Code Status (Order [id number])    Question Answer Comment   In the event of cardiac or respiratory ARREST Do not call a "code blue"    In the event of cardiac or respiratory ARREST Do not perform Intubation, CPR, defibrillation or ACLS    In the event of cardiac or respiratory ARREST Use medication by any route, position, wound care, and other measures to relive pain and suffering. May use oxygen, suction and manual treatment of airway obstruction as needed for comfort.       TOTAL TIME TAKING CARE OF THIS PATIENT ON DAY OF DISCHARGE: more than 35 minutes.   Ihor Austin M.D on 05/01/2018 at 2:22 PM  Between 7am to 6pm - Pager - [phone]  After 6pm go to [email or web address] - password [email or web address]  [facility] [place] [facility]  Office  [phone]  CC: Primary care physician; [facility]  Note: This dictation was prepared with Dragon dictation along with smaller phrase technology. Any transcriptional errors that result from this process are unintentional.

## 2018-05-01 NOTE — Progress Notes (Addendum)
[facility] - [place] at [facility]   PATIENT NAME: Timothy Pham    MR#:  [id number]  DATE OF BIRTH:  10-25-1965  SUBJECTIVE:  CHIEF COMPLAINT:   Chief Complaint  Patient presents with  . Abdominal Pain  Patient seen today Has no abdominal distention No abdominal pain NG tube with suction discontinued after reviewing abdominal x ray  REVIEW OF SYSTEMS:    ROS  CONSTITUTIONAL: No documented fever. No fatigue, weakness. No weight gain, no weight loss.  EYES: No blurry or double vision.  ENT: No tinnitus. No postnasal drip. No redness of the oropharynx.  RESPIRATORY: No cough, no wheeze, no hemoptysis. No dyspnea.  CARDIOVASCULAR: No chest pain. No orthopnea. No palpitations. No syncope.  GASTROINTESTINAL: No nausea, no vomiting or diarrhea. Has no abdominal pain. No melena or hematochezia.  GENITOURINARY: No dysuria or hematuria.  ENDOCRINE: No polyuria or nocturia. No heat or cold intolerance.  HEMATOLOGY: No anemia. No bruising. No bleeding.  INTEGUMENTARY: No rashes. No lesions.  MUSCULOSKELETAL: No arthritis. No swelling. No gout.  NEUROLOGIC: No numbness, tingling, or ataxia. No seizure-type activity.  PSYCHIATRIC: No anxiety. No insomnia. No ADD.   DRUG ALLERGIES:   Allergies  Allergen Reactions  . Wellbutrin [Bupropion] Itching    Patient thought he was allergic but is not. It is well tolerated    VITALS:  Blood pressure 131/74, pulse 62, temperature 97.6 F (36.4 C), temperature source Oral, resp. rate 18, height 5\' 5"  (1.651 m), weight 74.8 kg, SpO2 95 %.  PHYSICAL EXAMINATION:   Physical Exam  GENERAL:  53 y.o.-year-old patient lying in the bed with no acute distress.  EYES: Pupils equal, round, reactive to light and accommodation. No scleral icterus. Extraocular muscles intact.  HEENT: Head atraumatic, normocephalic. Oropharynx and nasopharynx clear.  Has NG tube NECK:  Supple, no jugular venous distention. No thyroid enlargement, no  tenderness.  LUNGS: Normal breath sounds bilaterally, no wheezing, rales, rhonchi. No use of accessory muscles of respiration.  CARDIOVASCULAR: S1, S2 normal. No murmurs, rubs, or gallops.  ABDOMEN: Soft, nontender, nondistended. Bowel sounds present. No organomegaly or mass.  EXTREMITIES: No cyanosis, clubbing or edema b/l.    NEUROLOGIC: Cranial nerves II through XII are intact. No focal Motor or sensory deficits b/l.   PSYCHIATRIC: The patient is alert and oriented x 3.  SKIN: No obvious rash, lesion, or ulcer.   LABORATORY PANEL:   CBC Recent Labs  Lab 05/01/18 0458  WBC 10.8*  HGB 12.9*  HCT 40.4  PLT 316   ------------------------------------------------------------------------------------------------------------------ Chemistries  Recent Labs  Lab 04/29/18 2152  05/01/18 0458  NA 143   < > 140  K 4.3   < > 3.9  CL 101   < > 108  CO2 27   < > 24  GLUCOSE 150*   < > 83  BUN 19   < > 14  CREATININE 1.11   < > 0.69  CALCIUM 10.2   < > 8.6*  AST 29  --   --   ALT 47*  --   --   ALKPHOS 109  --   --   BILITOT 0.7  --   --    < > = values in this interval not displayed.   ------------------------------------------------------------------------------------------------------------------  Cardiac Enzymes No results for input(s): TROPONINI in the last 168 hours. ------------------------------------------------------------------------------------------------------------------  RADIOLOGY:  Dg Abd 1 View  Result Date: 05/01/2018 CLINICAL DATA:  Follow-up small bowel obstruction. EXAM: ABDOMEN - 1 VIEW COMPARISON:  04/30/2018 FINDINGS: An NG tube is identified with tip overlying the mid stomach. Dilated small bowel loops have slightly decreased in caliber. No suspicious calcifications. IMPRESSION: Slightly decreased small bowel distension. Electronically Signed   By: Harmon Pier M.D.   On: 05/01/2018 08:54   Dg Abdomen 1 View  Result Date: 04/30/2018 CLINICAL DATA:   Abdominal pain for 1 week. Vomiting. EXAM: ABDOMEN - 1 VIEW COMPARISON:  None. FINDINGS: Multiple dilated gas distended small bowel loops with relative decompression of gas-filled colon. Changes likely to represent small bowel obstruction. No radiopaque stones. Degenerative changes in the spine. IMPRESSION: Multiple dilated gas-filled small bowel loops suggesting small bowel obstruction. Electronically Signed   By: Burman Nieves M.D.   On: 04/30/2018 00:51   Ct Abdomen Pelvis W Contrast  Result Date: 04/30/2018 CLINICAL DATA:  Generalized abdominal pain and distention. EXAM: CT ABDOMEN AND PELVIS WITH CONTRAST TECHNIQUE: Multidetector CT imaging of the abdomen and pelvis was performed using the standard protocol following bolus administration of intravenous contrast. CONTRAST:  100 mL Omnipaque 300 COMPARISON:  None. FINDINGS: Lower chest: Mild dependent changes in the lung bases. Moderate esophageal hiatal hernia. Esophagus is distended and fluid-filled as is the hiatal hernia. Hepatobiliary: Diffuse fatty infiltration of the liver. Calcified granuloma. Gallbladder and bile ducts are unremarkable. Pancreas: Unremarkable. No pancreatic ductal dilatation or surrounding inflammatory changes. Spleen: Normal in size without focal abnormality. Adrenals/Urinary Tract: Scattered calcifications in both kidneys may be vascular or may represent stones. No hydronephrosis or hydroureter. Nephrograms are symmetrical and homogeneous. Bladder is unremarkable. Stomach/Bowel: Stomach and proximal small bowel are diffusely distended and fluid-filled. Terminal ileum is decompressed. Changes are consistent with mechanical small bowel obstruction. Transition zone appears to be in the right lower quadrant. No cause is identified. No wall thickening or mesenteric edema. Colon is mostly decompressed with some scattered stool. Appendix is normal. Vascular/Lymphatic: Aortic atherosclerosis. No enlarged abdominal or pelvic lymph nodes.  Reproductive: Prostate gland is enlarged, measuring 5.1 cm diameter. Other: No free air or free fluid in the abdomen. Abdominal wall musculature appears intact. Musculoskeletal: Degenerative changes in the spine. Compression of T12 and T10 vertebrae. Nonspecific but likely chronic. IMPRESSION: 1. Small bowel obstruction with transition zone in the right lower quadrant. No cause is identified. 2. Fatty infiltration of the liver. 3. Esophageal hiatal hernia with distended esophagus, likely reflux. 4. Enlarged prostate gland. 5. Aortic atherosclerosis. 6. Nonobstructive stones in both kidneys. 7. Compression of T10 and T12 vertebrae, likely chronic. Electronically Signed   By: Burman Nieves M.D.   On: 04/30/2018 03:10   Dg Abd 2 Views  Result Date: 04/30/2018 CLINICAL DATA:  Abdominal pain.  Evaluate small bowel obstruction. EXAM: ABDOMEN - 2 VIEW COMPARISON:  CT abdomen and pelvis April 30, 2018 FINDINGS: Nasogastric tube tip projects in mid stomach. Multiple loops of gas-filled small bowel measuring to 3.8 cm. Paucity of large bowel gas. No intra-abdominal mass effect or pathologic calcifications. Soft tissue planes and included osseous structures are nonacute. Old RIGHT rib fractures. IMPRESSION: 1. Nasogastric tube tip projecting in mid stomach. 2. Early versus partial small bowel obstruction. Electronically Signed   By: Awilda Metro M.D.   On: 04/30/2018 13:53     ASSESSMENT AND PLAN:  53 year old male patient with history of GERD, hyperlipidemia, hypertension, CVA, bipolar disorder currently under hospitalist service for bowel obstruction  -Acute small bowel obstruction improving NG tube with suction to be discontinued Start liquid diet and advance to oral soft diet once tolerated IV fluids Resume aspirin,  plavix Surgery follow-up appreciated Conservative mgmt only Abdominal x-ray reviewed  -GERD Continue proton pump inhibitor  -DVT prophylaxis subcu Lovenox daily  -Nausea and  vomiting Antiemetics  -Leukocytosis resolved Could be stress-induced Will discontinue abx  -Tobacco abuse Tobacco cessation counseled to the patient for 6 minutes Nicotine patch offered  All the records are reviewed and case discussed with Care Management/Social Worker. Management plans discussed with the patient, family and they are in agreement.  CODE STATUS: Full code  DVT Prophylaxis: SCDs  TOTAL TIME TAKING CARE OF THIS PATIENT: 35 minutes.   POSSIBLE D/C IN 3 to 4 DAYS, DEPENDING ON CLINICAL CONDITION.  Ihor Austin M.D on 05/01/2018 at 12:45 PM  Between 7am to 6pm - Pager - [phone]  After 6pm go to [email or web address] - password [email or web address]  [facility] [place] [facility]  Office  [phone]  CC: Primary care physician; [facility]  Note: This dictation was prepared with Dragon dictation along with smaller phrase technology. Any transcriptional errors that result from this process are unintentional.

## 2018-05-01 NOTE — Progress Notes (Signed)
[facility] - [place] at [facility]    Timothy Pham was admitted to the Hospital on 04/30/2018 and Discharged  05/01/2018 and should be excused from work/school from 04/30/2018 to 05/02/2018.  Call Ihor Austin MD with questions.  Ihor Austin M.D on 05/01/2018,at 2:43 PM  [facility] - [place] at [facility]  [phone]

## 2018-11-06 ENCOUNTER — Encounter: Payer: Self-pay | Admitting: Emergency Medicine

## 2018-11-06 ENCOUNTER — Emergency Department: Payer: Self-pay

## 2018-11-06 ENCOUNTER — Other Ambulatory Visit: Payer: Self-pay

## 2018-11-06 ENCOUNTER — Emergency Department
Admission: EM | Admit: 2018-11-06 | Discharge: 2018-11-07 | Disposition: A | Discharge status: Home / Self Care | Payer: Self-pay | Attending: Emergency Medicine | Admitting: Emergency Medicine

## 2018-11-06 DIAGNOSIS — Z7901 Long term (current) use of anticoagulants: Secondary | ICD-10-CM | POA: Insufficient documentation

## 2018-11-06 DIAGNOSIS — F17210 Nicotine dependence, cigarettes, uncomplicated: Secondary | ICD-10-CM | POA: Insufficient documentation

## 2018-11-06 DIAGNOSIS — Z7982 Long term (current) use of aspirin: Secondary | ICD-10-CM | POA: Insufficient documentation

## 2018-11-06 DIAGNOSIS — X501XXA Overexertion from prolonged static or awkward postures, initial encounter: Secondary | ICD-10-CM | POA: Insufficient documentation

## 2018-11-06 DIAGNOSIS — Z8673 Personal history of transient ischemic attack (TIA), and cerebral infarction without residual deficits: Secondary | ICD-10-CM | POA: Insufficient documentation

## 2018-11-06 DIAGNOSIS — I10 Essential (primary) hypertension: Secondary | ICD-10-CM | POA: Insufficient documentation

## 2018-11-06 DIAGNOSIS — M25462 Effusion, left knee: Secondary | ICD-10-CM | POA: Insufficient documentation

## 2018-11-06 DIAGNOSIS — Y929 Unspecified place or not applicable: Secondary | ICD-10-CM | POA: Insufficient documentation

## 2018-11-06 DIAGNOSIS — Y939 Activity, unspecified: Secondary | ICD-10-CM | POA: Insufficient documentation

## 2018-11-06 DIAGNOSIS — Y999 Unspecified external cause status: Secondary | ICD-10-CM | POA: Insufficient documentation

## 2018-11-06 DIAGNOSIS — Z79899 Other long term (current) drug therapy: Secondary | ICD-10-CM | POA: Insufficient documentation

## 2018-11-06 DIAGNOSIS — S8392XA Sprain of unspecified site of left knee, initial encounter: Secondary | ICD-10-CM | POA: Insufficient documentation

## 2018-11-06 NOTE — ED Triage Notes (Signed)
Patient states that he was getting ready for work this morning and he felt a pop in his left knee. Patient states that he was able to work but that the pain ha become worse.

## 2018-11-07 MED ORDER — TRAMADOL HCL 50 MG PO TABS: 50.0000 mg | ORAL_TABLET | Freq: Four times a day (QID) | ORAL | 0 refills | Status: DC | PRN

## 2018-11-07 MED ORDER — TRAMADOL HCL 50 MG PO TABS
50.0000 mg | ORAL_TABLET | Freq: Once | ORAL | Status: AC
  Administered 2018-11-07: 50 mg via ORAL
  Filled 2018-11-07: qty 1

## 2018-11-07 NOTE — ED Provider Notes (Signed)
[facility] Emergency Department Provider Note   ____________________________________________    I have reviewed the triage vital signs and the nursing notes.   HISTORY  Chief Complaint Knee Pain     HPI Timothy Pham is a 53 y.o. male with history as noted below who presents with complaints of left knee pain.  Patient reports he felt a pop in his knee this morning when he was putting on his left shoe, he was able to work all day and ambulate with some discomfort.  When he got home he was walking up the steps and "tweaked it again "now he reports it is very painful to walk on.  Has not take anything for this  Past Medical History:  Diagnosis Date  . Anxiety   . Bipolar 1 disorder (HCC)   . GERD (gastroesophageal reflux disease)   . Hyperlipidemia   . Hypertension   . Nephrolithiasis   . Stroke [facility](HCC)    December 2019    Patient Active Problem List   Diagnosis Date Noted  . Small bowel obstruction (HCC) 04/30/2018  . GERD (gastroesophageal reflux disease) 01/28/2018  . Hyperlipidemia 01/28/2018  . Carotid stenosis, symptomatic, with infarction (HCC) 01/28/2018  . Headache 01/20/2018    Past Surgical History:  Procedure Laterality Date  . CAROTID ANGIOGRAPHY Bilateral 01/21/2018   Procedure: CAROTID ANGIOGRAPHY;  Surgeon: Renford DillsSchnier, Gregory G, MD;  Location: [facility] INVASIVE CV LAB;  Service: Cardiovascular;  Laterality: Bilateral;    Prior to Admission medications   Medication Sig Start Date End Date Taking? Authorizing Provider  allopurinol (ZYLOPRIM) 100 MG tablet Take 100 mg by mouth daily. 04/14/18   [provider]  amLODipine (NORVASC) 5 MG tablet Take 5 mg by mouth daily. 04/19/18   [provider]  aspirin EC 81 MG tablet Take 81 mg by mouth daily. 01/22/18   [provider]  atorvastatin (LIPITOR) 40 MG tablet Take 1 tablet (40 mg total) by mouth daily at 6 PM. 03/27/18   Georgiana SpinnerBrown, Fallon E, NP  buPROPion  (WELLBUTRIN) 75 MG tablet Take 75 mg by mouth daily.     [provider]  clopidogrel (PLAVIX) 75 MG tablet Take 1 tablet (75 mg total) by mouth daily. 03/27/18   Georgiana SpinnerBrown, Fallon E, NP  loratadine (CLARITIN) 10 MG tablet Take 10 mg by mouth daily. 09/09/16   [provider]  omeprazole (PRILOSEC) 20 MG capsule Take 20 mg by mouth 2 (two) times daily. 09/09/16   [provider]  traMADol (ULTRAM) 50 MG tablet Take 1 tablet (50 mg total) by mouth every 6 (six) hours as needed. 11/07/18 11/07/19  Jene EveryKinner, Errin Whitelaw, MD     Allergies Wellbutrin [bupropion]  Family History  Problem Relation Age of Onset  . Heart disease Mother   . Bipolar disorder Mother   . Heart disease Father   . Diabetes Mellitus II Father   . Obesity Sister   . Heart disease Brother     Social History Social History   Tobacco Use  . Smoking status: Current Every Day Smoker    Packs/day: 1.00    Types: Cigarettes  . Smokeless tobacco: Never Used  Substance Use Topics  . Alcohol use: Not Currently  . Drug use: No    Review of Systems  Constitutional: No fever/chills        Musculoskeletal: As above Skin: Negative for rash. Neurological: Negative for numbness    ____________________________________________   PHYSICAL EXAM:  VITAL SIGNS: ED Triage Vitals [  11/06/18 2317]  Enc Vitals Group     BP (!) 142/73     Pulse Rate 67     Resp 18     Temp 98.2 F (36.8 C)     Temp Source Oral     SpO2 97 %     Weight 69.9 kg (154 lb)     Height 1.651 m (5\' 5" )     Head Circumference      Peak Flow      Pain Score 10     Pain Loc      Pain Edu?      Excl. in [place]?      Constitutional: Alert and oriented.   Nose: No congestion/rhinnorhea. Mouth/Throat: Mucous membranes are moist.   Cardiovascular: Normal rate, regular rhythm.  Respiratory: Normal respiratory effort.  No retractions. Genitourinary: deferred Musculoskeletal: Left knee: Mild tenderness palpation on the medial  aspect of the knee, minimal effusion, normal pulses distally Neurologic:  Normal speech and language. No gross focal neurologic deficits are appreciated.   Skin:  Skin is warm, dry and intact. No rash noted.   ____________________________________________   LABS (all labs ordered are listed, but only abnormal results are displayed)  Labs Reviewed - No data to display ____________________________________________  EKG   ____________________________________________  RADIOLOGY  X-ray moderate knee effusion ____________________________________________   PROCEDURES  Procedure(s) performed: No  Procedures   Critical Care performed: No ____________________________________________   INITIAL IMPRESSION / ASSESSMENT AND PLAN / ED COURSE  Pertinent labs & imaging results that were available during my care of the patient were reviewed by me and considered in my medical decision making (see chart for details).  Patient with likely meniscal versus ligamentous injury, knee immobilizer placed, crutches, tramadol here in the emergency department, follow-up with orthopedics.   ____________________________________________   FINAL CLINICAL IMPRESSION(S) / ED DIAGNOSES  Final diagnoses:  Effusion of left knee  Sprain of left knee, unspecified ligament, initial encounter      NEW MEDICATIONS STARTED DURING THIS VISIT:  Discharge Medication List as of 11/07/2018 12:36 AM    START taking these medications   Details  traMADol (ULTRAM) 50 MG tablet Take 1 tablet (50 mg total) by mouth every 6 (six) hours as needed., Starting Sat 11/07/2018, Until Sun 11/07/2019, Normal         Note:  This document was prepared using Dragon voice recognition software and may include unintentional dictation errors.   Lavonia Drafts, MD 11/07/18 [phone]

## 2018-11-10 ENCOUNTER — Encounter: Payer: Self-pay | Admitting: Emergency Medicine

## 2018-11-10 ENCOUNTER — Other Ambulatory Visit: Payer: Self-pay

## 2018-11-10 ENCOUNTER — Inpatient Hospital Stay
Admission: EM | Admit: 2018-11-10 | Discharge: 2018-11-14 | DRG: 517 | Disposition: A | Discharge status: Home Health | Payer: Self-pay | Attending: Internal Medicine | Admitting: Internal Medicine

## 2018-11-10 ENCOUNTER — Emergency Department: Payer: Self-pay

## 2018-11-10 DIAGNOSIS — F32A Depression, unspecified: Secondary | ICD-10-CM | POA: Diagnosis present

## 2018-11-10 DIAGNOSIS — E785 Hyperlipidemia, unspecified: Secondary | ICD-10-CM | POA: Diagnosis present

## 2018-11-10 DIAGNOSIS — F319 Bipolar disorder, unspecified: Secondary | ICD-10-CM | POA: Diagnosis present

## 2018-11-10 DIAGNOSIS — Y92010 Kitchen of single-family (private) house as the place of occurrence of the external cause: Secondary | ICD-10-CM

## 2018-11-10 DIAGNOSIS — W1830XA Fall on same level, unspecified, initial encounter: Secondary | ICD-10-CM | POA: Diagnosis present

## 2018-11-10 DIAGNOSIS — Z7982 Long term (current) use of aspirin: Secondary | ICD-10-CM

## 2018-11-10 DIAGNOSIS — Z8673 Personal history of transient ischemic attack (TIA), and cerebral infarction without residual deficits: Secondary | ICD-10-CM

## 2018-11-10 DIAGNOSIS — Z888 Allergy status to other drugs, medicaments and biological substances status: Secondary | ICD-10-CM

## 2018-11-10 DIAGNOSIS — S32010A Wedge compression fracture of first lumbar vertebra, initial encounter for closed fracture: Principal | ICD-10-CM | POA: Diagnosis present

## 2018-11-10 DIAGNOSIS — M109 Gout, unspecified: Secondary | ICD-10-CM | POA: Diagnosis present

## 2018-11-10 DIAGNOSIS — R509 Fever, unspecified: Secondary | ICD-10-CM

## 2018-11-10 DIAGNOSIS — S32020A Wedge compression fracture of second lumbar vertebra, initial encounter for closed fracture: Secondary | ICD-10-CM

## 2018-11-10 DIAGNOSIS — Z818 Family history of other mental and behavioral disorders: Secondary | ICD-10-CM

## 2018-11-10 DIAGNOSIS — Z8249 Family history of ischemic heart disease and other diseases of the circulatory system: Secondary | ICD-10-CM

## 2018-11-10 DIAGNOSIS — F17210 Nicotine dependence, cigarettes, uncomplicated: Secondary | ICD-10-CM | POA: Diagnosis present

## 2018-11-10 DIAGNOSIS — F329 Major depressive disorder, single episode, unspecified: Secondary | ICD-10-CM | POA: Diagnosis present

## 2018-11-10 DIAGNOSIS — Z833 Family history of diabetes mellitus: Secondary | ICD-10-CM

## 2018-11-10 DIAGNOSIS — Z419 Encounter for procedure for purposes other than remedying health state, unspecified: Secondary | ICD-10-CM

## 2018-11-10 DIAGNOSIS — Z7902 Long term (current) use of antithrombotics/antiplatelets: Secondary | ICD-10-CM

## 2018-11-10 DIAGNOSIS — F4320 Adjustment disorder, unspecified: Secondary | ICD-10-CM | POA: Diagnosis present

## 2018-11-10 DIAGNOSIS — F419 Anxiety disorder, unspecified: Secondary | ICD-10-CM | POA: Diagnosis present

## 2018-11-10 DIAGNOSIS — I10 Essential (primary) hypertension: Secondary | ICD-10-CM | POA: Diagnosis present

## 2018-11-10 DIAGNOSIS — Z20828 Contact with and (suspected) exposure to other viral communicable diseases: Secondary | ICD-10-CM | POA: Diagnosis present

## 2018-11-10 DIAGNOSIS — K219 Gastro-esophageal reflux disease without esophagitis: Secondary | ICD-10-CM | POA: Diagnosis present

## 2018-11-10 LAB — SURGICAL PCR SCREEN
MRSA, PCR: NEGATIVE
Staphylococcus aureus: NEGATIVE

## 2018-11-10 LAB — SARS CORONAVIRUS 2 BY RT PCR (HOSPITAL ORDER, PERFORMED IN ~~LOC~~ HOSPITAL LAB): SARS Coronavirus 2: NEGATIVE

## 2018-11-10 MED ORDER — HYDROMORPHONE HCL 1 MG/ML IJ SOLN
1.0000 mg | INTRAMUSCULAR | Status: DC | PRN
  Administered 2018-11-10 – 2018-11-12 (×7): 1 mg via INTRAVENOUS
  Filled 2018-11-10 (×7): qty 1

## 2018-11-10 MED ORDER — AMLODIPINE BESYLATE 5 MG PO TABS
5.0000 mg | ORAL_TABLET | Freq: Every day | ORAL | Status: DC
  Administered 2018-11-10 – 2018-11-14 (×5): 5 mg via ORAL
  Filled 2018-11-10 (×5): qty 1

## 2018-11-10 MED ORDER — OXYCODONE HCL 5 MG PO TABS
5.0000 mg | ORAL_TABLET | ORAL | Status: DC | PRN
  Administered 2018-11-11 – 2018-11-13 (×9): 5 mg via ORAL
  Filled 2018-11-10 (×9): qty 1

## 2018-11-10 MED ORDER — ACETAMINOPHEN 325 MG PO TABS
650.0000 mg | ORAL_TABLET | Freq: Four times a day (QID) | ORAL | Status: DC | PRN
  Administered 2018-11-12 – 2018-11-14 (×3): 650 mg via ORAL
  Filled 2018-11-10 (×3): qty 2

## 2018-11-10 MED ORDER — KETOROLAC TROMETHAMINE 30 MG/ML IJ SOLN
30.0000 mg | Freq: Once | INTRAMUSCULAR | Status: AC
  Administered 2018-11-10: 30 mg via INTRAVENOUS
  Filled 2018-11-10: qty 1

## 2018-11-10 MED ORDER — ACETAMINOPHEN 650 MG RE SUPP: 650.0000 mg | Freq: Four times a day (QID) | RECTAL | Status: DC | PRN

## 2018-11-10 MED ORDER — ONDANSETRON HCL 4 MG/2ML IJ SOLN: 4.0000 mg | Freq: Four times a day (QID) | INTRAMUSCULAR | Status: DC | PRN

## 2018-11-10 MED ORDER — ORPHENADRINE CITRATE 30 MG/ML IJ SOLN: 60.0000 mg | Freq: Two times a day (BID) | INTRAMUSCULAR | Status: DC

## 2018-11-10 MED ORDER — KETOROLAC TROMETHAMINE 60 MG/2ML IM SOLN: 60.0000 mg | Freq: Once | INTRAMUSCULAR | Status: DC

## 2018-11-10 MED ORDER — POLYETHYLENE GLYCOL 3350 17 G PO PACK: 17.0000 g | PACK | Freq: Every day | ORAL | Status: DC | PRN

## 2018-11-10 MED ORDER — ONDANSETRON HCL 4 MG PO TABS: 4.0000 mg | ORAL_TABLET | Freq: Four times a day (QID) | ORAL | Status: DC | PRN

## 2018-11-10 MED ORDER — BUPROPION HCL ER (XL) 150 MG PO TB24
150.0000 mg | ORAL_TABLET | Freq: Every day | ORAL | Status: DC
  Administered 2018-11-10 – 2018-11-14 (×5): 150 mg via ORAL
  Filled 2018-11-10 (×5): qty 1

## 2018-11-10 MED ORDER — LORATADINE 10 MG PO TABS: 10.0000 mg | ORAL_TABLET | Freq: Every day | ORAL | Status: DC | PRN

## 2018-11-10 MED ORDER — ALLOPURINOL 100 MG PO TABS
100.0000 mg | ORAL_TABLET | Freq: Every day | ORAL | Status: DC
  Administered 2018-11-10 – 2018-11-14 (×5): 100 mg via ORAL
  Filled 2018-11-10 (×5): qty 1

## 2018-11-10 MED ORDER — ASPIRIN EC 81 MG PO TBEC
81.0000 mg | DELAYED_RELEASE_TABLET | Freq: Every day | ORAL | Status: DC
  Administered 2018-11-10 – 2018-11-14 (×4): 81 mg via ORAL
  Filled 2018-11-10 (×5): qty 1

## 2018-11-10 MED ORDER — PANTOPRAZOLE SODIUM 40 MG PO TBEC
40.0000 mg | DELAYED_RELEASE_TABLET | Freq: Every day | ORAL | Status: DC
  Administered 2018-11-10 – 2018-11-14 (×5): 40 mg via ORAL
  Filled 2018-11-10 (×5): qty 1

## 2018-11-10 MED ORDER — ATORVASTATIN CALCIUM 20 MG PO TABS
40.0000 mg | ORAL_TABLET | Freq: Every day | ORAL | Status: DC
  Administered 2018-11-10 – 2018-11-13 (×4): 40 mg via ORAL
  Filled 2018-11-10 (×4): qty 2

## 2018-11-10 MED ORDER — HYDROMORPHONE HCL 1 MG/ML IJ SOLN: 1.0000 mg | Freq: Once | INTRAMUSCULAR | Status: DC

## 2018-11-10 MED ORDER — MORPHINE SULFATE (PF) 4 MG/ML IV SOLN
4.0000 mg | INTRAVENOUS | Status: DC | PRN
  Administered 2018-11-10: 4 mg via INTRAVENOUS
  Filled 2018-11-10 (×2): qty 1

## 2018-11-10 MED ORDER — MORPHINE SULFATE (PF) 4 MG/ML IV SOLN
4.0000 mg | Freq: Once | INTRAVENOUS | Status: AC
  Administered 2018-11-10: 11:00:00 4 mg via INTRAVENOUS
  Filled 2018-11-10: qty 1

## 2018-11-10 MED ORDER — MORPHINE SULFATE (PF) 4 MG/ML IV SOLN
4.0000 mg | Freq: Once | INTRAVENOUS | Status: AC
  Administered 2018-11-10: 4 mg via INTRAVENOUS
  Filled 2018-11-10: qty 1

## 2018-11-10 MED ORDER — MUPIROCIN 2 % EX OINT
1.0000 "application " | TOPICAL_OINTMENT | Freq: Two times a day (BID) | CUTANEOUS | Status: DC
  Filled 2018-11-10: qty 22

## 2018-11-10 NOTE — ED Notes (Signed)
Encourage pt to try to sit up  Pt refused   states he want to try to eat or drink   I informed pt that he could not eat something while lying flat

## 2018-11-10 NOTE — ED Provider Notes (Signed)
[facility] Emergency Department Provider Note   ____________________________________________   First MD Initiated Contact with Patient 11/10/18 [phone]     (approximate)  I have reviewed the triage vital signs and the nursing notes.   HISTORY  Chief Complaint Fall and Back Pain    HPI Timothy Pham is a 53 y.o. male patient complain of back pain with inability sit up status post falling backwards yesterday.  Patient that he lost his balance secondary to meniscus injury to the left knee.  Patient denies LOC or head injury.  Patient states has not voided or had a bowel movement since the fall.  Patient rates his pain as a 10/10.  Patient state he took 100 mg of tramadol prior to arrival for EMS for transport to hospital.         Past Medical History:  Diagnosis Date  . Anxiety   . Bipolar 1 disorder ([place])   . GERD (gastroesophageal reflux disease)   . Hyperlipidemia   . Hypertension   . Nephrolithiasis   . Stroke [facility])    December 2019    Patient Active Problem List   Diagnosis Date Noted  . Small bowel obstruction ([place]) 04/30/2018  . GERD (gastroesophageal reflux disease) 01/28/2018  . Hyperlipidemia 01/28/2018  . Carotid stenosis, symptomatic, with infarction ([place]) 01/28/2018  . Headache 01/20/2018    Past Surgical History:  Procedure Laterality Date  . CAROTID ANGIOGRAPHY Bilateral 01/21/2018   Procedure: CAROTID ANGIOGRAPHY;  Surgeon: Katha Cabal, MD;  Location: [place] CV LAB;  Service: Cardiovascular;  Laterality: Bilateral;    Prior to Admission medications   Medication Sig Start Date End Date Taking? Authorizing Provider  allopurinol (ZYLOPRIM) 100 MG tablet Take 100 mg by mouth daily. 04/14/18  Yes [provider]  amLODipine (NORVASC) 5 MG tablet Take 5 mg by mouth daily. 04/19/18  Yes [provider]  aspirin EC 81 MG tablet Take 81 mg by mouth daily. 01/22/18  Yes [provider]   atorvastatin (LIPITOR) 40 MG tablet Take 1 tablet (40 mg total) by mouth daily at 6 PM. 03/27/18  Yes Kris Hartmann, NP  buPROPion (WELLBUTRIN XL) 150 MG 24 hr tablet Take 150 mg by mouth daily. 05/13/18 05/13/19 Yes [provider]  clopidogrel (PLAVIX) 75 MG tablet Take 1 tablet (75 mg total) by mouth daily. 03/27/18  Yes Kris Hartmann, NP  colchicine 0.6 MG tablet Take 0.6 mg by mouth daily. 05/13/18  Yes [provider]  loratadine (CLARITIN) 10 MG tablet Take 10 mg by mouth daily. 05/11/18  Yes [provider]  omeprazole (PRILOSEC) 20 MG capsule Take 20 mg by mouth 2 (two) times daily. 05/08/18  Yes [provider]  traMADol (ULTRAM) 50 MG tablet Take 1 tablet (50 mg total) by mouth every 6 (six) hours as needed. 11/07/18 11/07/19 Yes Lavonia Drafts, MD    Allergies Wellbutrin [bupropion]  Family History  Problem Relation Age of Onset  . Heart disease Mother   . Bipolar disorder Mother   . Heart disease Father   . Diabetes Mellitus II Father   . Obesity Sister   . Heart disease Brother     Social History Social History   Tobacco Use  . Smoking status: Current Every Day Smoker    Packs/day: 1.00    Types: Cigarettes  . Smokeless tobacco: Never Used  Substance Use Topics  . Alcohol use: Not Currently  . Drug use: No    Review of  Systems Constitutional: No fever/chills Eyes: No visual changes. ENT: No sore throat. Cardiovascular: Denies chest pain. Respiratory: Denies shortness of breath. Gastrointestinal: No abdominal pain.  No nausea, no vomiting.  No diarrhea.  No constipation. Genitourinary: Negative for dysuria. Musculoskeletal: Negative for back pain. Skin: Negative for rash. Neurological: Negative for headaches, focal weakness or numbness. Psychiatric: Anxiety and bipolar.  Endocrine:  Hyperlipidemia and hypertension. Allergic/Immunilogical: Wellbutrin. ____________________________________________   PHYSICAL EXAM:  VITAL  SIGNS: ED Triage Vitals [11/10/18 0913]  Enc Vitals Group     BP 130/63     Pulse Rate (!) 56     Resp 18     Temp 97.7 F (36.5 C)     Temp Source Oral     SpO2 93 %     Weight 152 lb (68.9 kg)     Height 5\' 5"  (1.651 m)     Head Circumference      Peak Flow      Pain Score 10     Pain Loc      Pain Edu?      Excl. in [place]?    Constitutional: Alert and oriented.  Moderate distress.   Neck: No cervical spine tenderness to palpation. Hematological/Lymphatic/Immunilogical: No cervical lymphadenopathy. Cardiovascular: Normal rate, regular rhythm. Grossly normal heart sounds.  Good peripheral circulation. Respiratory: Normal respiratory effort.  No retractions. Lungs CTAB. Gastrointestinal: Soft and nontender. No distention. No abdominal bruits. No CVA tenderness. Musculoskeletal: Examination of back limited back patient complain of pain.  No obvious deformity.  Moderate guarding palpation of L3-S1.  Patient also has moderate guarding palpation of the anterior left knee.   Neurologic:  Normal speech and language. No gross focal neurologic deficits are appreciated. No gait instability. Skin:  Skin is warm, dry and intact. No rash noted. Psychiatric: Mood and affect are normal. Speech and behavior are normal.  ____________________________________________   LABS (all labs ordered are listed, but only abnormal results are displayed)  Labs Reviewed  SARS CORONAVIRUS 2 (HOSPITAL ORDER, PERFORMED IN [place] [facility])   ____________________________________________  EKG   ____________________________________________  RADIOLOGY  ED MD interpretation:    Official radiology report(s): Ct Lumbar Spine Wo Contrast  Result Date: 11/10/2018 CLINICAL DATA:  Severe low back pain since the patient suffered a fall from standing yesterday. Initial encounter. EXAM: CT LUMBAR SPINE WITHOUT CONTRAST TECHNIQUE: Multidetector CT imaging of the lumbar spine was performed without  intravenous contrast administration. Multiplanar CT image reconstructions were also generated. COMPARISON:  CT abdomen and pelvis 04/30/2018. FINDINGS: Segmentation: Standard. Alignment: Maintained. Vertebrae: Since the prior CT, the patient has suffered an L3 superior endplate compression fracture with vertebral body height loss of up to approximately 30%. The fracture does not involve the posterior elements. Remote, severe compression fracture of T12 is unchanged. No other fracture is identified. Paraspinal and other soft tissues: Small bilateral nonobstructing renal stones versus vascular calcifications are unchanged. Atherosclerosis is noted. Disc levels: T11-12: Negative. T12-L1: Mild bony retropulsion off the superior endplate of T12 without stenosis. L1-2: Minimal retropulsion off the superior endplate of L2 without stenosis. L2-3: Negative. L3-4: Negative. L4-5: Shallow disc bulge and mild facet arthropathy.  No stenosis. L5-S1: Minimal disc bulge without stenosis. IMPRESSION: The examination is positive for an acute superior endplate compression fracture of L2 with vertebral body height loss of up to approximately 30%. There is minimal retropulsion off the superior endplate of L2 but no central canal stenosis. Remote, severe T12 compression fracture appears unchanged. Electronically Signed   By:  Drusilla Kanner M.D.   On: 11/10/2018 10:05    ____________________________________________   PROCEDURES  Procedure(s) performed (including Critical Care):  Procedures   ____________________________________________   INITIAL IMPRESSION / ASSESSMENT AND PLAN / ED COURSE  As part of my medical decision making, I reviewed the following data within the electronic MEDICAL RECORD NUMBER         Timothy Pham was evaluated in Emergency Department on 11/10/2018 for the symptoms described in the history of present illness. He was evaluated in the context of the global COVID-19 pandemic, which necessitated  consideration that the patient might be at risk for infection with the SARS-CoV-2 virus that causes COVID-19. Institutional protocols and algorithms that pertain to the evaluation of patients at risk for COVID-19 are in a state of rapid change based on information released by regulatory bodies including the CDC and federal and state organizations. These policies and algorithms were followed during the patient's care in the ED.    Patient was arrived via EMS back pain stating cannot sit up secondary to falling yesterday.  CT shows a compression fracture L2 approximate 30% of body height loss.  Patient was placed in a back brace and was still unable to sit up without moderate discomfort.  Discussed patient with hospitalist who will  evaluated for admission.  ____________________________________________   FINAL CLINICAL IMPRESSION(S) / ED DIAGNOSES  Final diagnoses:  Compression fracture of L2 lumbar vertebra, closed, initial encounter [facility])     ED Discharge Orders    None       Note:  This document was prepared using Dragon voice recognition software and may include unintentional dictation errors.    Joni Reining, PA-C 11/10/18 1503    Phineas Semen, MD 11/11/18 [phone]

## 2018-11-10 NOTE — ED Notes (Signed)
Resting quietly  Awaiting room assignment

## 2018-11-10 NOTE — ED Notes (Signed)
Pt was placed in back brace   Encouraged pt to sit up after being placed in brace  Pt refuses  States he is not able to so   Provider made aware

## 2018-11-10 NOTE — ED Triage Notes (Addendum)
Presents via EMS  States he lost his balance yesterday afternoon  [place] [address] backwards  having pain in back   States he took 100 mg of Tramadol at 8 am   States his brother helped up off the floor  Placed him on the sofa. pt presents with knee immobilizer in placed from a previous visit

## 2018-11-10 NOTE — ED Notes (Signed)
Called to give report  States that they may change bed assignment

## 2018-11-10 NOTE — Social Work (Addendum)
CSW received phone call from [place], [place] regarding rehab. Patient currently does not have insurance. Will see if possible that patient receive home health PT, if there are charity slots available.   UPDATE: Patient will be admitted to the hospital.   Fredric Mare, [place] [facility] [phone]

## 2018-11-10 NOTE — H&P (Addendum)
[facility]   PATIENT NAME: Timothy Pham    MR#:  [id number]  DATE OF BIRTH:  09-Jan-1966  DATE OF ADMISSION:  11/10/2018  PRIMARY CARE PHYSICIAN: [facility]   REQUESTING/REFERRING PHYSICIAN: Nona Dell, PA-C  CHIEF COMPLAINT:   Chief Complaint  Patient presents with  . Fall  . Back Pain    HISTORY OF PRESENT ILLNESS:  Timothy Pham  is a 53 y.o. male with a known history of hypertension, hyperlipidemia, history of stroke, bipolar 1 disorder, anxiety who presented to the ED with lower back pain.  Patient states that last Friday, he was putting his shoes on when he heard something pop in his left knee.  He then went to work, where he walked around a lot.  When he got home, he noticed that his left knee was swollen.  He twisted his knee for a second time while walking up his stairs.  The ED on 9/18 for left knee pain.  He was prescribed some tramadol and was placed in a knee immobilizer.  Knee x-ray in the ED showed a moderate knee effusion. He was advised to follow-up with orthopedic surgery as an outpatient.  He was told that he would have an MRI for further evaluation.  This is supposed to be scheduled this week.  Yesterday, patient was trying to get some ice from the refrigerator to apply to his knee, when he suddenly fell backwards onto the floor of his kitchen.  He he denies any syncope or loss of consciousness.  He did hit his head on the floor.  He laid on the ground for about 1 hour, before his brother found him.  He had instant low back pain.  The back pain did not radiate.  He denies any numbness or tingling of his legs.  No bowel or bladder incontinence.  In the ED, vitals were unremarkable.  Labs were significant for WBC 10.8.  CT lumbar spine showed an acute superior endplate compression fracture of L2 with vertebral body height loss of approximately 30%, as well as a remote severe T12 compression fracture.  Hospitalists  were called for admission  PAST MEDICAL HISTORY:   Past Medical History:  Diagnosis Date  . Anxiety   . Bipolar 1 disorder (HCC)   . GERD (gastroesophageal reflux disease)   . Hyperlipidemia   . Hypertension   . Nephrolithiasis   . Stroke [facility])    December 2019    PAST SURGICAL HISTORY:   Past Surgical History:  Procedure Laterality Date  . CAROTID ANGIOGRAPHY Bilateral 01/21/2018   Procedure: CAROTID ANGIOGRAPHY;  Surgeon: Renford Dills, MD;  Location: [facility] INVASIVE CV LAB;  Service: Cardiovascular;  Laterality: Bilateral;    SOCIAL HISTORY:   Social History   Tobacco Use  . Smoking status: Current Every Day Smoker    Packs/day: 1.00    Types: Cigarettes  . Smokeless tobacco: Never Used  Substance Use Topics  . Alcohol use: Not Currently    FAMILY HISTORY:   Family History  Problem Relation Age of Onset  . Heart disease Mother   . Bipolar disorder Mother   . Heart disease Father   . Diabetes Mellitus II Father   . Obesity Sister   . Heart disease Brother     DRUG ALLERGIES:   Allergies  Allergen Reactions  . Wellbutrin [Bupropion] Itching    Patient thought he was allergic but is not. It is well tolerated  REVIEW OF SYSTEMS:   Review of Systems  Constitutional: Negative for chills and fever.  HENT: Negative for congestion and sore throat.   Eyes: Negative for blurred vision and double vision.  Respiratory: Negative for cough and shortness of breath.   Cardiovascular: Negative for palpitations.  Gastrointestinal: Negative for nausea and vomiting.  Genitourinary: Negative for dysuria and urgency.  Musculoskeletal: Positive for back pain, falls and joint pain.  Neurological: Positive for weakness. Negative for dizziness, loss of consciousness and headaches.  Psychiatric/Behavioral: Negative for depression. The patient is not nervous/anxious.     MEDICATIONS AT HOME:   Prior to Admission medications   Medication Sig Start Date End Date  Taking? Authorizing Provider  allopurinol (ZYLOPRIM) 100 MG tablet Take 100 mg by mouth daily. 04/14/18  Yes [provider]  amLODipine (NORVASC) 5 MG tablet Take 5 mg by mouth daily. 04/19/18  Yes [provider]  aspirin EC 81 MG tablet Take 81 mg by mouth daily. 01/22/18  Yes [provider]  atorvastatin (LIPITOR) 40 MG tablet Take 1 tablet (40 mg total) by mouth daily at 6 PM. 03/27/18  Yes Kris Hartmann, NP  buPROPion (WELLBUTRIN XL) 150 MG 24 hr tablet Take 150 mg by mouth daily. 05/13/18 05/13/19 Yes [provider]  clopidogrel (PLAVIX) 75 MG tablet Take 1 tablet (75 mg total) by mouth daily. 03/27/18  Yes Kris Hartmann, NP  colchicine 0.6 MG tablet Take 0.6 mg by mouth daily. 05/13/18  Yes [provider]  loratadine (CLARITIN) 10 MG tablet Take 10 mg by mouth daily. 05/11/18  Yes [provider]  omeprazole (PRILOSEC) 20 MG capsule Take 20 mg by mouth 2 (two) times daily. 05/08/18  Yes [provider]  traMADol (ULTRAM) 50 MG tablet Take 1 tablet (50 mg total) by mouth every 6 (six) hours as needed. 11/07/18 11/07/19 Yes Lavonia Drafts, MD      VITAL SIGNS:  Blood pressure 128/60, pulse 60, temperature 97.7 F (36.5 C), temperature source Oral, resp. rate 18, height 5\' 5"  (1.651 m), weight 68.9 kg, SpO2 94 %.  PHYSICAL EXAMINATION:  Physical Exam  GENERAL:  53 y.o.-year-old patient lying in the bed with no acute distress.  EYES: Pupils equal, round, reactive to light and accommodation. No scleral icterus. Extraocular muscles intact.  HEENT: Head atraumatic, normocephalic. Oropharynx and nasopharynx clear.  NECK:  Supple, no jugular venous distention. No thyroid enlargement, no tenderness.  LUNGS: Normal breath sounds bilaterally, no wheezing, rales,rhonchi or crepitation. No use of accessory muscles of respiration.  CARDIOVASCULAR: RRR, S1, S2 normal. No murmurs, rubs, or gallops.  ABDOMEN: Soft, nontender, nondistended. Bowel  sounds present. No organomegaly or mass.  BACK: Back brace in place. EXTREMITIES: No pedal edema, cyanosis, or clubbing. + Knee immobilizer present over the left knee. NEUROLOGIC: Cranial nerves II through XII are intact.  Unable to assess full muscle strength in lower extremity secondary to pain. Sensation intact throughout the legs. Gait not checked.  PSYCHIATRIC: The patient is alert and oriented x 3.  SKIN: No obvious rash, lesion, or ulcer.   LABORATORY PANEL:   CBC No results for input(s): WBC, HGB, HCT, PLT in the last 168 hours. ------------------------------------------------------------------------------------------------------------------  Chemistries  No results for input(s): NA, K, CL, CO2, GLUCOSE, BUN, CREATININE, CALCIUM, MG, AST, ALT, ALKPHOS, BILITOT in the last 168 hours.  Invalid input(s): GFRCGP ------------------------------------------------------------------------------------------------------------------  Cardiac Enzymes No results for input(s): TROPONINI in the last 168 hours. ------------------------------------------------------------------------------------------------------------------  RADIOLOGY:  Ct Lumbar Spine Wo Contrast  Result Date: 11/10/2018 CLINICAL DATA:  Severe low back pain since the patient suffered a fall from standing yesterday. Initial encounter. EXAM: CT LUMBAR SPINE WITHOUT CONTRAST TECHNIQUE: Multidetector CT imaging of the lumbar spine was performed without intravenous contrast administration. Multiplanar CT image reconstructions were also generated. COMPARISON:  CT abdomen and pelvis 04/30/2018. FINDINGS: Segmentation: Standard. Alignment: Maintained. Vertebrae: Since the prior CT, the patient has suffered an L3 superior endplate compression fracture with vertebral body height loss of up to approximately 30%. The fracture does not involve the posterior elements. Remote, severe compression fracture of T12 is unchanged. No other fracture is  identified. Paraspinal and other soft tissues: Small bilateral nonobstructing renal stones versus vascular calcifications are unchanged. Atherosclerosis is noted. Disc levels: T11-12: Negative. T12-L1: Mild bony retropulsion off the superior endplate of T12 without stenosis. L1-2: Minimal retropulsion off the superior endplate of L2 without stenosis. L2-3: Negative. L3-4: Negative. L4-5: Shallow disc bulge and mild facet arthropathy.  No stenosis. L5-S1: Minimal disc bulge without stenosis. IMPRESSION: The examination is positive for an acute superior endplate compression fracture of L2 with vertebral body height loss of up to approximately 30%. There is minimal retropulsion off the superior endplate of L2 but no central canal stenosis. Remote, severe T12 compression fracture appears unchanged. Electronically Signed   By: Drusilla Kanner M.D.   On: 11/10/2018 10:05      IMPRESSION AND PLAN:   Acute L2 compression fracture s/p mechanical fall -Orthopedic surgery consult- discussed with Dr. Rosita Kea- possibly kyphoplasty on Thursday (last took plavix 9/21 at 0800) -Pain control -Patient has a back brace in place -Obtain PT consult when appropriate  Left knee injury- likely ligamentous tear. -Left knee x-rays with a moderate knee effusion -Will obtain MRI left knee -Orthopedic consult as above  Leukocytosis- likely a stress reaction.  No signs of infection. -Will continue to monitor  History of stroke- no residual deficits -Continue aspirin -Hold Plavix until evaluated by orthopedic surgery  Hypertension-BP normal in the ED -Continue home Norvasc  Hyperlipidemia-stable -Continue home Lipitor  History of gout-no acute flare -Continue home allopurinol  Bipolar 1 disorder-stable -Continue home Wellbutrin  DVT prophylaxis- SCDs until evaluated by surgery  All the records are reviewed and case discussed with ED provider. Management plans discussed with the patient, family and they are in  agreement.  CODE STATUS: Full  TOTAL TIME TAKING CARE OF THIS PATIENT: 45 minutes.    Jinny Blossom Laquasha Groome M.D on 11/10/2018 at 3:11 PM  Between 7am to 6pm - Pager - [phone]  After 6pm go to [email or web address] - Social research officer, government  [facility] [place] [facility]  Office  [phone]  CC: Primary care physician; [facility]   Note: This dictation was prepared with Dragon dictation along with smaller phrase technology. Any transcriptional errors that result from this process are unintentional.

## 2018-11-11 ENCOUNTER — Inpatient Hospital Stay: Payer: Self-pay

## 2018-11-11 LAB — CBC
HCT: 40 % (ref 39.0–52.0)
Hemoglobin: 13.1 g/dL (ref 13.0–17.0)
MCH: 30.4 pg (ref 26.0–34.0)
MCHC: 32.8 g/dL (ref 30.0–36.0)
MCV: 92.8 fL (ref 80.0–100.0)
Platelets: 358 10*3/uL (ref 150–400)
RBC: 4.31 MIL/uL (ref 4.22–5.81)
RDW: 13.4 % (ref 11.5–15.5)
WBC: 11 10*3/uL — ABNORMAL HIGH (ref 4.0–10.5)
nRBC: 0 % (ref 0.0–0.2)

## 2018-11-11 LAB — BASIC METABOLIC PANEL
Anion gap: 12 (ref 5–15)
BUN: 22 mg/dL — ABNORMAL HIGH (ref 6–20)
CO2: 22 mmol/L (ref 22–32)
Calcium: 9.4 mg/dL (ref 8.9–10.3)
Chloride: 106 mmol/L (ref 98–111)
Creatinine, Ser: 0.91 mg/dL (ref 0.61–1.24)
GFR calc Af Amer: >60 mL/min (ref >60)
GFR calc non Af Amer: >60 mL/min (ref >60)
Glucose, Bld: 98 mg/dL (ref 70–99)
Potassium: 4 mmol/L (ref 3.5–5.1)
Sodium: 140 mmol/L (ref 135–145)

## 2018-11-11 MED ORDER — CHLORHEXIDINE GLUCONATE CLOTH 2 % EX PADS
6.0000 | MEDICATED_PAD | Freq: Every day | CUTANEOUS | Status: DC
  Administered 2018-11-11 – 2018-11-14 (×4): 6 via TOPICAL

## 2018-11-11 MED ORDER — DIAZEPAM 5 MG PO TABS
10.0000 mg | ORAL_TABLET | Freq: Once | ORAL | Status: AC
  Administered 2018-11-11: 10 mg via ORAL
  Filled 2018-11-11: qty 2

## 2018-11-11 MED ORDER — ENSURE ENLIVE PO LIQD
237.0000 mL | Freq: Two times a day (BID) | ORAL | Status: DC
  Administered 2018-11-11 – 2018-11-14 (×3): 237 mL via ORAL

## 2018-11-11 NOTE — Progress Notes (Signed)
[facility] [place] NAME: Timothy Pham    MR#:  [id number]  DATE OF BIRTH:  1965/11/29  SUBJECTIVE:  CHIEF COMPLAINT: Timothy Pham is complaining of back pain and left knee pain Feeling sad but at the same time very anxious to get MRI REVIEW OF SYSTEMS:  CONSTITUTIONAL: No fever, fatigue or weakness.  EYES: No blurred or double vision.  EARS, NOSE, AND THROAT: No tinnitus or ear pain.  RESPIRATORY: No cough, shortness of breath, wheezing or hemoptysis.  CARDIOVASCULAR: No chest pain, orthopnea, edema.  GASTROINTESTINAL: No nausea, vomiting, diarrhea or abdominal pain.  GENITOURINARY: No dysuria, hematuria.  ENDOCRINE: No polyuria, nocturia,  HEMATOLOGY: No anemia, easy bruising or bleeding SKIN: No rash or lesion. MUSCULOSKELETAL: Back pain and knee pain on the left side NEUROLOGIC: No tingling, numbness, weakness.  PSYCHIATRY: No anxiety or depression.   DRUG ALLERGIES:   Allergies  Allergen Reactions  . Wellbutrin [Bupropion] Itching    Timothy Pham thought he was allergic but is not. It is well tolerated    VITALS:  Blood pressure (!) 141/92, pulse 79, temperature 97.7 F (36.5 C), temperature source Oral, resp. rate 17, height 5\' 5"  (1.651 m), weight 68.9 kg, SpO2 93 %.  PHYSICAL EXAMINATION:  GENERAL:  53 y.o.-year-old Timothy Pham lying in the bed with no acute distress.  EYES: Pupils equal, round, reactive to light and accommodation. No scleral icterus. Extraocular muscles intact.  HEENT: Head atraumatic, normocephalic. Oropharynx and nasopharynx clear.  NECK:  Supple, no jugular venous distention. No thyroid enlargement, no tenderness.  LUNGS: Normal breath sounds bilaterally, no wheezing, rales,rhonchi or crepitation. No use of accessory muscles of respiration.  CARDIOVASCULAR: S1, S2 normal. No murmurs, rubs, or gallops.  ABDOMEN: Soft, nontender, nondistended. Bowel sounds present.  EXTREMITIES: Left knee is extended and  edematous, no pedal edema, cyanosis, or clubbing.  Lumbar vertebral area is tender to touch NEUROLOGIC: Cranial nerves II through XII are intact. Muscle strength weak in all extremities mainly left knee area is very tender sensation intact. Gait not checked.  PSYCHIATRIC: The Timothy Pham is alert and oriented x 3.  SKIN: No obvious rash, lesion, or ulcer.    LABORATORY PANEL:   CBC Recent Labs  Lab 11/11/18 0606  WBC 11.0*  HGB 13.1  HCT 40.0  PLT 358   ------------------------------------------------------------------------------------------------------------------  Chemistries  Recent Labs  Lab 11/11/18 0606  NA 140  K 4.0  CL 106  CO2 22  GLUCOSE 98  BUN 22*  CREATININE 0.91  CALCIUM 9.4   ------------------------------------------------------------------------------------------------------------------  Cardiac Enzymes No results for input(s): TROPONINI in the last 168 hours. ------------------------------------------------------------------------------------------------------------------  RADIOLOGY:  Ct Lumbar Spine Wo Contrast  Result Date: 11/10/2018 CLINICAL DATA:  Severe low back pain since the Timothy Pham suffered a fall from standing yesterday. Initial encounter. EXAM: CT LUMBAR SPINE WITHOUT CONTRAST TECHNIQUE: Multidetector CT imaging of the lumbar spine was performed without intravenous contrast administration. Multiplanar CT image reconstructions were also generated. COMPARISON:  CT abdomen and pelvis 04/30/2018. FINDINGS: Segmentation: Standard. Alignment: Maintained. Vertebrae: Since the prior CT, the Timothy Pham has suffered an L3 superior endplate compression fracture with vertebral body height loss of up to approximately 30%. The fracture does not involve the posterior elements. Remote, severe compression fracture of T12 is unchanged. No other fracture is identified. Paraspinal and other soft tissues: Small bilateral nonobstructing renal stones versus vascular  calcifications are unchanged. Atherosclerosis is noted. Disc levels: T11-12: Negative. T12-L1: Mild bony retropulsion off the superior endplate of N62 without  stenosis. L1-2: Minimal retropulsion off the superior endplate of L2 without stenosis. L2-3: Negative. L3-4: Negative. L4-5: Shallow disc bulge and mild facet arthropathy.  No stenosis. L5-S1: Minimal disc bulge without stenosis. IMPRESSION: The examination is positive for an acute superior endplate compression fracture of L2 with vertebral body height loss of up to approximately 30%. There is minimal retropulsion off the superior endplate of L2 but no central canal stenosis. Remote, severe T12 compression fracture appears unchanged. Electronically Signed   By: Drusilla Kanner M.D.   On: 11/10/2018 10:05    EKG:   Orders placed or performed during the hospital encounter of 01/19/18  . ED EKG  . ED EKG  . ED EKG  . ED EKG  . EKG 12-Lead  . EKG 12-Lead    ASSESSMENT AND PLAN:    Acute L2 compression fracture s/p mechanical fall -Orthopedic surgery consult- discussed with Dr. Jasmine Pang for kyphoplasty on Thursday (last took plavix 9/21 at 0800) -Pain control as needed -Timothy Pham has a back brace in place -Obtain PT consult  after kyphoplasty -MRI of the lumbar spine done results are pending -N.p.o. after midnight  Left knee injury- likely ligamentous tear. -Left knee x-rays with a moderate knee effusion -Timothy Pham is refusing MRI left knee -Orthopedic consult as above  Leukocytosis- likely a stress reaction.  No signs of infection. -Will continue to monitor  History of stroke- no residual deficits -Continue aspirin -Hold Plavix until evaluated by orthopedic surgery  Hypertension-BP normal in the ED -Continue home Norvasc  Hyperlipidemia-stable -Continue home Lipitor  History of gout-no acute flare -Continue home allopurinol  Bipolar 1 disorder-stable -Continue home Wellbutrin  DVT prophylaxis- SCDs until  evaluated by surgery    All the records are reviewed and case discussed with Care Management/Social Workerr. Management plans discussed with the Timothy Pham, he is in agreement.  CODE STATUS: fc   TOTAL TIME TAKING CARE OF THIS Timothy Pham: 35  minutes.   POSSIBLE D/C IN 2 DAYS, DEPENDING ON CLINICAL CONDITION.  Note: This dictation was prepared with Dragon dictation along with smaller phrase technology. Any transcriptional errors that result from this process are unintentional.   Ramonita Lab M.D on 11/11/2018 at 9:47 PM  Between 7am to 6pm - Pager - [phone] After 6pm go to [email or web address] - password [email or web address] [facility]  [facility]  Office  [phone]  CC: Primary care physician; [facility]

## 2018-11-11 NOTE — TOC Initial Note (Signed)
Transition of Care [facility]) - Initial/Assessment Note    Patient Details  Name: Falcon Mccaskey MRN: [id number] Date of Birth: Mar 13, 1965  Transition of Care [facility]) CM/SW Contact:    Gift Rueckert, Lenice Llamas Phone Number: [phone]  11/11/2018, 2:29 PM  Clinical Narrative: Clinical Social Worker (CSW) met with patient to provide resources and discuss D/C plan. Patient was alert and oriented X4 and was laying in the bed. Patient reported that he is in pain. RN aware of above and was at bedside. CSW provided emotional support. CSW introduced self and explained role of CSW department. Per patient he lives alone in a mobile home in [place]. Patient reported that he has no social support and has no children. Patient reported that he divorced his wife several years ago and lost his house in [place]. Patient reported that he moved to [place] to be with a lady however their relationship ended. [place] reported that he has a brother Carloyn Manner that lives locally however he can't depend on him. Per patient his brother Carloyn Manner has "mental problems." Patient reported that he recently started a new job at [employer] in [place] and he has only been on the job for 3 days. Patient reported that he currently has no health insurance and his insurance through his employer is going to start Oct. 1st. 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 Patient reported that he needs to apply for disability. CSW explained that a financial counselor will screen patient to determine if a medicaid and disability application is appropriate. CSW emailed Lizbeth Bark [facility] financial counselor. Patient reported that he drives and has a car. Patient reported that he has no income and no savings. Patient is concerned about not being able to work and pay his bills. CSW provided emotional support and a list of [employer] and shelter.  Patient reported that he had a stroke 1 year ago and stated the he wished that he died after the stroke so he did not have to deal with what is going on now. Patient reported that he is not having thoughts of hurting himself and will never hurt himself because he does not like pain. CSW requested MD to order psych consult for depression. Per RN patient also has Bipolar disorder. Patent reported that he does not drink alcohol and does not use drugs. Patient reported that he goes to [facility] in [place] to see his PCP. Patient reported that he receives his medications from [facility] in [place] at a reduced cost. Patient reported that he doesn't know what he is going to do after he leaves [facility] because he has nobody to help him at home. CSW explained that PT will evaluate patient and make a recommendation. Patient will need a rolling walker and bedside commode when he goes home. CSW will continue to follow and assist as needed.                  Expected Discharge Plan: [place] Barriers to Discharge: Continued Medical Work up, Inadequate or no insurance   Patient Goals and CMS Choice Patient states their goals for this hospitalization and ongoing recovery are:: Pain control.      Expected Discharge Plan and Services Expected Discharge Plan: [place] In-house Referral: Clinical Social Work     Living arrangements for the past 2  months: Mobile Home                                      Prior Living Arrangements/Services Living arrangements for the past 2 months: Mobile Home Lives with:: Self Patient language and need for interpreter reviewed:: No Do you feel safe going back to the place where you live?: Yes      Need for Family Participation in Patient Care: Yes (Comment) Care giver support system in place?: No (comment)   Criminal Activity/Legal Involvement Pertinent to Current Situation/Hospitalization: No - Comment as  needed  Activities of Daily Living Home Assistive Devices/Equipment: Blood pressure cuff, Eyeglasses, Other (Comment)(crutches) ADL Screening (condition at time of admission) Patient's cognitive ability adequate to safely complete daily activities?: Yes Is the patient deaf or have difficulty hearing?: No Does the patient have difficulty seeing, even when wearing glasses/contacts?: No Does the patient have difficulty concentrating, remembering, or making decisions?: No Patient able to express need for assistance with ADLs?: Yes Does the patient have difficulty dressing or bathing?: Yes Independently performs ADLs?: Yes (appropriate for developmental age) Does the patient have difficulty walking or climbing stairs?: Yes Weakness of Legs: Left Weakness of Arms/Hands: Both  Permission Sought/Granted                  Emotional Assessment Appearance:: Appears stated age Attitude/Demeanor/Rapport: Engaged Affect (typically observed): Calm, Pleasant Orientation: : Oriented to Self, Oriented to Place, Oriented to  Time, Oriented to Situation Alcohol / Substance Use: Not Applicable Psych Involvement: Yes (comment)(CSW requested a psych consult for depression and bipolar disorder.)  Admission diagnosis:  Compression fracture of L2 lumbar vertebra, closed, initial encounter [facility]) [S32.020A] Patient Active Problem List   Diagnosis Date Noted  . Compression fracture of L2 ([place]) 11/10/2018  . Small bowel obstruction ([place]) 04/30/2018  . GERD (gastroesophageal reflux disease) 01/28/2018  . Hyperlipidemia 01/28/2018  . Carotid stenosis, symptomatic, with infarction ([place]) 01/28/2018  . Headache 01/20/2018   PCP:  [facility]:   [id number] [id number]: [phone]: [phone]    Social Determinants of Health (SDOH) Interventions    Readmission Risk Interventions No flowsheet data  found.

## 2018-11-11 NOTE — Progress Notes (Signed)
Initial Nutrition Assessment  DOCUMENTATION CODES:   Not applicable  INTERVENTION:  Recommend liberalizing diet to regular.  Provide Ensure Enlive po BID, each supplement provides 350 kcal and 20 grams of protein.  NUTRITION DIAGNOSIS:   Inadequate oral intake related to decreased appetite as evidenced by per patient/family report.  GOAL:   Patient will meet greater than or equal to 90% of their needs  MONITOR:   PO intake, Supplement acceptance, Labs, Weight trends, Skin, I & O's  REASON FOR ASSESSMENT:   Malnutrition Screening Tool    ASSESSMENT:   53 year old male with PMHx of bipolar 1 disorder, anxiety, GERD, HlD, HTN, hx CVA, nephrolithiasis admitted with acute L2 compression fracture, left knee injury.   Met with patient at bedside. He was not very engaged in assessment and it was difficult to obtain much history from him. He reports he has had a decreased appetite and intake since he switched to 3rd shift around March of this year. He is unable to describe his usual intake. He reports that now from his pain he is having difficulty eating because he cannot sit up. He has tried some chips and gelatin. He likes Ensure and is willing to drink these as well.   Patient reports he has lost around 25 lbs but he cannot recall his UBW. Per chart he was 75.7 kg on 01/21/2018. He is now 68.9 kg (152 lbs). He has lost 6.8 kg (9% body weight) over the past 9 months, which is not significant for time frame.  Medications reviewed and include: allopurinol, pantoprazole.  Labs reviewed.  Patient does not meet criteria for malnutrition at this time.  NUTRITION - FOCUSED PHYSICAL EXAM:    Most Recent Value  Orbital Region  No depletion  Upper Arm Region  Mild depletion  Thoracic and Lumbar Region  No depletion  Buccal Region  No depletion  Temple Region  No depletion  Clavicle Bone Region  No depletion  Clavicle and Acromion Bone Region  No depletion  Scapular Bone Region  No  depletion  Dorsal Hand  No depletion  Patellar Region  No depletion  Anterior Thigh Region  No depletion  Posterior Calf Region  No depletion  Edema (RD Assessment)  None  Hair  Reviewed  Eyes  Reviewed  Mouth  Reviewed  Skin  Reviewed  Nails  Reviewed     Diet Order:   Diet Order            Diet Heart Room service appropriate? Yes; Fluid consistency: Thin  Diet effective now             EDUCATION NEEDS:   No education needs have been identified at this time  Skin:  Skin Assessment: Reviewed RN Assessment  Last BM:  11/09/2018 per chart  Height:   Ht Readings from Last 1 Encounters:  11/10/18 '5\' 5"'  (1.651 m)   Weight:   Wt Readings from Last 1 Encounters:  11/10/18 68.9 kg   Ideal Body Weight:  61.8 kg  BMI:  Body mass index is 25.29 kg/m.  Estimated Nutritional Needs:   Kcal:  6015-6153  Protein:  87-97 grams  Fluid:  1.7-1.9 L/day  Willey Blade, MS, RD, LDN Office: [phone] Pager: [phone] After Hours/Weekend Pager: [phone]

## 2018-11-11 NOTE — Progress Notes (Signed)
0700 received bedside report, introduced myself to pt. Updated the board, call light with in reach 0800 gave pt fresh ice water, assessment and vs completed and charted, pt refuses to be turned. Pt was put on 2LNC due to oxygen dropping to 87% 0900 gave pain meds 1000 gave morning meds, pt refuses to be turned 1100 assist pt with eating 1200 pt refuses to be turned 1300 pt went to MRI, refused to move over on the table due to pain, ortho was notified more meds where written for and pt will go back down later 1400 gave pain meds, pt still refusing to be turned, pt was educated on the importance of turning and still refuses 1500 text ortho to come and speak with patient 1600 gave ensure 1700 gave pain meds

## 2018-11-11 NOTE — Consult Note (Signed)
Reason for Consult: L2 compression fracture and possible meniscus tear Referring Physician: Dr. Kevin Fenton Timothy Pham is an 53 y.o. male.  HPI: Patient reported suffering injury to his knee last Friday at work.  He walked on it all day and subsequent went to the emergency room with a knee brace applied.  He has having pain in the knee and yesterday try to get ice for his knee fell backwards on the floor and had severe pain in the back with CT in the ER today showing L2 acute impression compression fracture.  There is an old T12 fracture that was evident on prior CT of the abdomen a few years ago.  He is having severe back pain and will not get out of bed or turn because of the severity of his back pain.  He also is complaining of his knee hurting a great deal.  Past Medical History:  Diagnosis Date  . Anxiety   . Bipolar 1 disorder ([place])   . GERD (gastroesophageal reflux disease)   . Hyperlipidemia   . Hypertension   . Nephrolithiasis   . Stroke [facility])    December 2019    Past Surgical History:  Procedure Laterality Date  . CAROTID ANGIOGRAPHY Bilateral 01/21/2018   Procedure: CAROTID ANGIOGRAPHY;  Surgeon: Katha Cabal, MD;  Location: [facility];  Service: Cardiovascular;  Laterality: Bilateral;    Family History  Problem Relation Age of Onset  . Heart disease Mother   . Bipolar disorder Mother   . Heart disease Father   . Diabetes Mellitus II Father   . Obesity Sister   . Heart disease Brother     Social History:  reports that he has been smoking cigarettes. He has been smoking about 1.00 pack per day. He has never used smokeless tobacco. He reports previous alcohol use. He reports that he does not use drugs.  Allergies:  Allergies  Allergen Reactions  . Wellbutrin [Bupropion] Itching    Patient thought he was allergic but is not. It is well tolerated    Medications: I have reviewed the patient's current medications.  Results for orders placed or performed  during the hospital encounter of 11/10/18 (from the past 48 hour(s))  SARS Coronavirus 2 [facility] order, Performed in [facility] hospital lab) Nasopharyngeal Nasopharyngeal Swab     Status: None   Collection Time: 11/10/18  2:27 PM   Specimen: Nasopharyngeal Swab  Result Value Ref Range   SARS Coronavirus 2 NEGATIVE NEGATIVE    Comment: (NOTE) If result is NEGATIVE SARS-CoV-2 target nucleic acids are NOT DETECTED. The SARS-CoV-2 RNA is generally detectable in upper and lower  respiratory specimens during the acute phase of infection. The lowest  concentration of SARS-CoV-2 viral copies this assay can detect is 250  copies / mL. A negative result does not preclude SARS-CoV-2 infection  and should not be used as the sole basis for treatment or other  patient management decisions.  A negative result may occur with  improper specimen collection / handling, submission of specimen other  than nasopharyngeal swab, presence of viral mutation(s) within the  areas targeted by this assay, and inadequate number of viral copies  (<250 copies / mL). A negative result must be combined with clinical  observations, patient history, and epidemiological information. If result is POSITIVE SARS-CoV-2 target nucleic acids are DETECTED. The SARS-CoV-2 RNA is generally detectable in upper and lower  respiratory specimens dur ing the acute phase of infection.  Positive  results are  indicative of active infection with SARS-CoV-2.  Clinical  correlation with patient history and other diagnostic information is  necessary to determine patient infection status.  Positive results do  not rule out bacterial infection or co-infection with other viruses. If result is PRESUMPTIVE POSTIVE SARS-CoV-2 nucleic acids MAY BE PRESENT.   A presumptive positive result was obtained on the submitted specimen  and confirmed on repeat testing.  While 2019 novel coronavirus  (SARS-CoV-2) nucleic acids may be present in the  submitted sample  additional confirmatory testing may be necessary for epidemiological  and / or clinical management purposes  to differentiate between  SARS-CoV-2 and other Sarbecovirus currently known to infect humans.  If clinically indicated additional testing with an alternate test  methodology [phone]) is advised. The SARS-CoV-2 RNA is generally  detectable in upper and lower respiratory sp ecimens during the acute  phase of infection. The expected result is Negative. Fact Sheet for Patients:  BoilerBrush.com.cy Fact Sheet for Healthcare Providers: https://pope.com/ This test is not yet approved or cleared by the [place] FDA and has been authorized for detection and/or diagnosis of SARS-CoV-2 by FDA under an Emergency Use Authorization (EUA).  This EUA will remain in effect (meaning this test can be used) for the duration of the COVID-19 declaration under Section 564(b)(1) of the Act, 21 U.S.C. section 360bbb-3(b)(1), unless the authorization is terminated or revoked sooner. Performed at [facility], [address] [place], [place] [address]   Surgical PCR screen     Status: None   Collection Time: 11/10/18 10:25 PM   Specimen: Nasal Mucosa; Nasal Swab  Result Value Ref Range   MRSA, PCR NEGATIVE NEGATIVE   Staphylococcus aureus NEGATIVE NEGATIVE    Comment: (NOTE) The Xpert SA Assay (FDA approved for NASAL specimens in patients 46 years of age and older), is one component of a comprehensive surveillance program. It is not intended to diagnose infection nor to guide or monitor treatment. Performed at [facility], [address] [place], [place] [address]   Basic metabolic panel     Status: Abnormal   Collection Time: 11/11/18  6:06 AM  Result Value Ref Range   Sodium 140 135 - 145 mmol/L   Potassium 4.0 3.5 - 5.1 mmol/L   Chloride 106 98 - 111 mmol/L   CO2 22 22 - 32 mmol/L   Glucose, Bld  98 70 - 99 mg/dL   BUN 22 (H) 6 - 20 mg/dL   Creatinine, Ser 5.18 0.61 - 1.24 mg/dL   Calcium 9.4 8.9 - 84.1 mg/dL   GFR calc non Af Amer >60 >60 mL/min   GFR calc Af Amer >60 >60 mL/min   Anion gap 12 5 - 15    Comment: Performed at [facility], [address] [place], [place] [address]  CBC     Status: Abnormal   Collection Time: 11/11/18  6:06 AM  Result Value Ref Range   WBC 11.0 (H) 4.0 - 10.5 K/uL   RBC 4.31 4.22 - 5.81 MIL/uL   Hemoglobin 13.1 13.0 - 17.0 g/dL   HCT 01.6 01.0 - 93.2 %   MCV 92.8 80.0 - 100.0 fL   MCH 30.4 26.0 - 34.0 pg   MCHC 32.8 30.0 - 36.0 g/dL   RDW 35.5 73.2 - 20.2 %   Platelets 358 150 - 400 K/uL   nRBC 0.0 0.0 - 0.2 %    Comment: Performed at [facility], [address], [place], [place] [address]  Ct Lumbar Spine Wo Contrast  Result Date: 11/10/2018 CLINICAL DATA:  Severe low back pain since the patient suffered a fall from standing yesterday. Initial encounter. EXAM: CT LUMBAR SPINE WITHOUT CONTRAST TECHNIQUE: Multidetector CT imaging of the lumbar spine was performed without intravenous contrast administration. Multiplanar CT image reconstructions were also generated. COMPARISON:  CT abdomen and pelvis 04/30/2018. FINDINGS: Segmentation: Standard. Alignment: Maintained. Vertebrae: Since the prior CT, the patient has suffered an L3 superior endplate compression fracture with vertebral body height loss of up to approximately 30%. The fracture does not involve the posterior elements. Remote, severe compression fracture of T12 is unchanged. No other fracture is identified. Paraspinal and other soft tissues: Small bilateral nonobstructing renal stones versus vascular calcifications are unchanged. Atherosclerosis is noted. Disc levels: T11-12: Negative. T12-L1: Mild bony retropulsion off the superior endplate of T12 without stenosis. L1-2: Minimal retropulsion off the superior endplate of L2 without stenosis. L2-3: Negative. L3-4:  Negative. L4-5: Shallow disc bulge and mild facet arthropathy.  No stenosis. L5-S1: Minimal disc bulge without stenosis. IMPRESSION: The examination is positive for an acute superior endplate compression fracture of L2 with vertebral body height loss of up to approximately 30%. There is minimal retropulsion off the superior endplate of L2 but no central canal stenosis. Remote, severe T12 compression fracture appears unchanged. Electronically Signed   By: Drusilla Kanner M.D.   On: 11/10/2018 10:05    ROS Blood pressure 119/68, pulse 62, temperature 98.3 F (36.8 C), temperature source Oral, resp. rate 17, height 5\' 5"  (1.651 m), weight 68.9 kg, SpO2 92 %. Physical Exam he is neurologically intact able to flex extend the ankles and has no hyperreflexia.  He would not turn to have the spine palpated but he describes the pain to be in the mid back consistent with his L2 fracture.  His knee has an effusion and pain with any attempted motion  Assessment/Plan: #1 L2 compression fracture causing severe pain and no neurologic deficit. #2 possible displaced meniscus tear and plans for MRI of that today.  Will review these results with him later today and discuss treatment options.  Kyphoplasty brochure and model were reviewed today and again we will discuss this with him later today.  Elastics Plavix on Monday and it would be tomorrow before any procedure could be done.  Tuesday 11/11/2018, 8:05 AM

## 2018-11-11 NOTE — Consult Note (Signed)
[facility]   Reason for Consult:  Depression, bipolar disorder Referring Physician:  Gouru Patient Identification: Timothy SieveKenneth Pham MRN:  [id number] Principal Diagnosis: adjustment disorder Diagnosis:  Active Problems:   Compression fracture of L2 (HCC)   Total Time spent with patient: 45 minutes  Subjective:   Timothy Pham is a 53 y.o. male patient admitted with compression fracture.  There were some concerns for his depression after discussing his social situation with Child psychotherapistsocial worker.  HPI: Patient is a 53 year old male who presented to the hospital with a compression fracture in his spine and will likely need surgery in the coming days.  He recently expressed some depression to Child psychotherapistsocial worker when discussing his social situation.  Patient reports that he has been somewhat depressed since his wife left him 3 years ago.  Patient reports that since that time he had gotten into another relationship but that also failed and he found himself without a home.  Patient reports that his brother recently had some bad words for him and told him "you messed up your marriage and now you messed up your back".  Patient recently got a new job at [employer] however he is concerned that he will lose the job following his time here in the hospital.  Patient denies feeling depressed out of context.  He states that anybody would feel sad if given his circumstances.  Patient denies suicidal ideation intent or plan.  He states that he has no prior attempts and would never be able to do something like that.  Patient states that he has been eating and sleeping fine except that the pain medication makes him a little bit constipated.  Patient denies any history of homicidal thoughts or any current homicidal thoughts.  Patient does carry a diagnosis of bipolar disorder however patient does not endorse any of the past symptoms of mania or psychosis including insomnia or audio or visual hallucinations.   Patient reports that he has been taking medication from his primary care doctor.  Patient states that he feels as if the medication does help his mood and at this time he is not interested in increasing his dosing.  He is interested on working on his mental health on an outpatient basis.  Past Psychiatric History: Patient carries a diagnosis of bipolar disorder.  However he does not endorse any of the symptoms of bipolar disorder and states that in the past if anything he has been depressed.  Patient does endorse one prior psychiatric admission at age 53 for bulimia.  Patient states that he had been engaged in psychiatric care on and off throughout his life most recently he has been receiving medications from his primary care doctor.  Risk to Self:  no Risk to Others:  no Prior Inpatient Therapy:  yes Prior Outpatient Therapy:  yes  Past Medical History:  Past Medical History:  Diagnosis Date  . Anxiety   . Bipolar 1 disorder (HCC)   . GERD (gastroesophageal reflux disease)   . Hyperlipidemia   . Hypertension   . Nephrolithiasis   . Stroke [facility](HCC)    December 2019    Past Surgical History:  Procedure Laterality Date  . CAROTID ANGIOGRAPHY Bilateral 01/21/2018   Procedure: CAROTID ANGIOGRAPHY;  Surgeon: Renford DillsSchnier, Gregory G, MD;  Location: [facility] INVASIVE CV LAB;  Service: Cardiovascular;  Laterality: Bilateral;   Family History: Family History  Problem Relation Age of Onset  . Heart disease Mother   . Bipolar disorder Mother   . Heart  disease Father   . Diabetes Mellitus II Father   . Obesity Sister   . Heart disease Brother    Family Psychiatric  History:  reports having a brother with bipolar disorder. Social History: patient was previously married for 25 years.  Social History   Substance and Sexual Activity  Alcohol Use Not Currently     Social History   Substance and Sexual Activity  Drug Use No    Social History   Socioeconomic History  . Marital status: Single     Spouse name: Not on file  . Number of children: Not on file  . Years of education: Not on file  . Highest education level: Not on file  Occupational History  . Not on file  Social Needs  . Financial resource strain: Not on file  . Food insecurity    Worry: Not on file    Inability: Not on file  . Transportation needs    Medical: Not on file    Non-medical: Not on file  Tobacco Use  . Smoking status: Current Every Day Smoker    Packs/day: 1.00    Types: Cigarettes  . Smokeless tobacco: Never Used  Substance and Sexual Activity  . Alcohol use: Not Currently  . Drug use: No  . Sexual activity: Not on file  Lifestyle  . Physical activity    Days per week: Not on file    Minutes per session: Not on file  . Stress: Not on file  Relationships  . Social Herbalist on phone: Not on file    Gets together: Not on file    Attends religious service: Not on file    Active member of club or organization: Not on file    Attends meetings of clubs or organizations: Not on file    Relationship status: Not on file  Other Topics Concern  . Not on file  Social History Narrative  . Not on file   Additional Social History:    Allergies:   Allergies  Allergen Reactions  . Wellbutrin [Bupropion] Itching    Patient thought he was allergic but is not. It is well tolerated    Labs:  Results for orders placed or performed during the hospital encounter of 11/10/18 (from the past 48 hour(s))  SARS Coronavirus 2 [facility] order, Performed in [facility] hospital lab) Nasopharyngeal Nasopharyngeal Swab     Status: None   Collection Time: 11/10/18  2:27 PM   Specimen: Nasopharyngeal Swab  Result Value Ref Range   SARS Coronavirus 2 NEGATIVE NEGATIVE    Comment: (NOTE) If result is NEGATIVE SARS-CoV-2 target nucleic acids are NOT DETECTED. The SARS-CoV-2 RNA is generally detectable in upper and lower  respiratory specimens during the acute phase of infection. The lowest   concentration of SARS-CoV-2 viral copies this assay can detect is 250  copies / mL. A negative result does not preclude SARS-CoV-2 infection  and should not be used as the sole basis for treatment or other  patient management decisions.  A negative result may occur with  improper specimen collection / handling, submission of specimen other  than nasopharyngeal swab, presence of viral mutation(s) within the  areas targeted by this assay, and inadequate number of viral copies  (<250 copies / mL). A negative result must be combined with clinical  observations, patient history, and epidemiological information. If result is POSITIVE SARS-CoV-2 target nucleic acids are DETECTED. The SARS-CoV-2 RNA is generally detectable in upper and lower  respiratory specimens dur ing the acute phase of infection.  Positive  results are indicative of active infection with SARS-CoV-2.  Clinical  correlation with patient history and other diagnostic information is  necessary to determine patient infection status.  Positive results do  not rule out bacterial infection or co-infection with other viruses. If result is PRESUMPTIVE POSTIVE SARS-CoV-2 nucleic acids MAY BE PRESENT.   A presumptive positive result was obtained on the submitted specimen  and confirmed on repeat testing.  While 2019 novel coronavirus  (SARS-CoV-2) nucleic acids may be present in the submitted sample  additional confirmatory testing may be necessary for epidemiological  and / or clinical management purposes  to differentiate between  SARS-CoV-2 and other Sarbecovirus currently known to infect humans.  If clinically indicated additional testing with an alternate test  methodology [phone]) is advised. The SARS-CoV-2 RNA is generally  detectable in upper and lower respiratory sp ecimens during the acute  phase of infection. The expected result is Negative. Fact Sheet for Patients:  BoilerBrush.com.cy Fact Sheet  for Healthcare Providers: https://pope.com/ This test is not yet approved or cleared by the [place] FDA and has been authorized for detection and/or diagnosis of SARS-CoV-2 by FDA under an Emergency Use Authorization (EUA).  This EUA will remain in effect (meaning this test can be used) for the duration of the COVID-19 declaration under Section 564(b)(1) of the Act, 21 U.S.C. section 360bbb-3(b)(1), unless the authorization is terminated or revoked sooner. Performed at [facility], [address], [place], [place] [address]   Surgical PCR screen     Status: None   Collection Time: 11/10/18 10:25 PM   Specimen: Nasal Mucosa; Nasal Swab  Result Value Ref Range   MRSA, PCR NEGATIVE NEGATIVE   Staphylococcus aureus NEGATIVE NEGATIVE    Comment: (NOTE) The Xpert SA Assay (FDA approved for NASAL specimens in patients 83 years of age and older), is one component of a comprehensive surveillance program. It is not intended to diagnose infection nor to guide or monitor treatment. Performed at [facility], [address] [place], [place] [address]   Basic metabolic panel     Status: Abnormal   Collection Time: 11/11/18  6:06 AM  Result Value Ref Range   Sodium 140 135 - 145 mmol/L   Potassium 4.0 3.5 - 5.1 mmol/L   Chloride 106 98 - 111 mmol/L   CO2 22 22 - 32 mmol/L   Glucose, Bld 98 70 - 99 mg/dL   BUN 22 (H) 6 - 20 mg/dL   Creatinine, Ser 9.62 0.61 - 1.24 mg/dL   Calcium 9.4 8.9 - 95.2 mg/dL   GFR calc non Af Amer >60 >60 mL/min   GFR calc Af Amer >60 >60 mL/min   Anion gap 12 5 - 15    Comment: Performed at [facility], [address] [place], [place] [address]  CBC     Status: Abnormal   Collection Time: 11/11/18  6:06 AM  Result Value Ref Range   WBC 11.0 (H) 4.0 - 10.5 K/uL   RBC 4.31 4.22 - 5.81 MIL/uL   Hemoglobin 13.1 13.0 - 17.0 g/dL   HCT 44.0 10.2 - 72.5 %   MCV 92.8 80.0 - 100.0 fL   MCH 30.4 26.0 -  34.0 pg   MCHC 32.8 30.0 - 36.0 g/dL   RDW 36.6 44.0 - 34.7 %   Platelets 358 150 - 400 K/uL   nRBC 0.0 0.0 - 0.2 %  Comment: Performed at [facility], [address] [place], [place] [address]    Current Facility-Administered Medications  Medication Dose Route Frequency Provider Last Rate Last Dose  . acetaminophen (TYLENOL) tablet 650 mg  650 mg Oral Q6H PRN Mayo, Allyn Kenner, MD       Or  . acetaminophen (TYLENOL) suppository 650 mg  650 mg Rectal Q6H PRN Mayo, Allyn Kenner, MD      . allopurinol (ZYLOPRIM) tablet 100 mg  100 mg Oral Daily Mayo, Allyn Kenner, MD   100 mg at 11/11/18 0954  . amLODipine (NORVASC) tablet 5 mg  5 mg Oral Daily Mayo, Allyn Kenner, MD   5 mg at 11/11/18 0955  . aspirin EC tablet 81 mg  81 mg Oral Daily Mayo, Allyn Kenner, MD   81 mg at 11/11/18 0954  . atorvastatin (LIPITOR) tablet 40 mg  40 mg Oral q1800 Mayo, Allyn Kenner, MD   40 mg at 11/10/18 2118  . buPROPion (WELLBUTRIN XL) 24 hr tablet 150 mg  150 mg Oral Daily Mayo, Allyn Kenner, MD   150 mg at 11/11/18 0955  . Chlorhexidine Gluconate Cloth 2 % PADS 6 each  6 each Topical Daily Gouru, Aruna, MD   6 each at 11/11/18 1246  . diazepam (VALIUM) tablet 10 mg  10 mg Oral Once Kennedy Bucker, MD      . HYDROmorphone (DILAUDID) injection 1 mg  1 mg Intravenous Q4H PRN Oralia Manis, MD   1 mg at 11/11/18 1356  . loratadine (CLARITIN) tablet 10 mg  10 mg Oral Daily PRN Mayo, Allyn Kenner, MD      . ondansetron [facility]) tablet 4 mg  4 mg Oral Q6H PRN Mayo, Allyn Kenner, MD       Or  . ondansetron [facility]) injection 4 mg  4 mg Intravenous Q6H PRN Mayo, Allyn Kenner, MD      . oxyCODONE (Oxy IR/ROXICODONE) immediate release tablet 5 mg  5 mg Oral Q4H PRN Mayo, Allyn Kenner, MD   5 mg at 11/11/18 1123  . pantoprazole (PROTONIX) EC tablet 40 mg  40 mg Oral Daily Mayo, Allyn Kenner, MD   40 mg at 11/11/18 0954  . polyethylene glycol (MIRALAX / GLYCOLAX) packet 17 g  17 g Oral Daily PRN Mayo, Allyn Kenner, MD         Musculoskeletal: Strength & Muscle Tone: within normal limits and decreased Gait & Station: unable to stand Patient leans: N/A  Psychiatric Specialty Exam: Physical Exam  Review of Systems  Constitutional: Positive for malaise/fatigue.  Eyes: Negative for photophobia.  Cardiovascular: Negative for chest pain.  Neurological: Positive for seizures. Negative for headaches.  Psychiatric/Behavioral: Negative for suicidal ideas. The patient does not have insomnia.     Blood pressure (!) 141/92, pulse 79, temperature 97.7 F (36.5 C), temperature source Oral, resp. rate 17, height 5\' 5"  (1.651 m), weight 68.9 kg, SpO2 93 %.Body mass index is 25.29 kg/m.  General Appearance: Fairly Groomed  Eye Contact:  Fair  Speech:  Normal Rate  Volume:  Normal  Mood:  Dysphoric  Affect:  Congruent  Thought Process:  Coherent  Orientation:  Full (Time, Place, and Person)  Thought Content:  Logical  Suicidal Thoughts:  No  Homicidal Thoughts:  No  Memory:  Recent;   Good  Judgement:  Intact  Insight:  Fair  Psychomotor Activity:  Decreased  Concentration:  Concentration: Good  Recall:  Good  Fund of Knowledge:  Good  Language:  Good  Akathisia:  No  Handed:  Right  AIMS (if indicated):     Assets:  Desire for Improvement  ADL's:  Impaired  Cognition:  WNL  Sleep:        Treatment Plan Summary: Daily contact with patient to assess and evaluate symptoms and progress in treatment.  No further management at this time patient's slightly dysphoric mood seems appropriate given his circumstances he is adamantly denying signs of depression including insomnia hopelessness and guilt or suicidal ideation.  Continue with the same dose of bupropion and arrange for outpatient follow-up  Disposition: Supportive therapy provided about ongoing stressors.  Clement Sayres, MD 11/11/2018 3:17 PM

## 2018-11-12 ENCOUNTER — Ambulatory Visit: Admission: RE | Admit: 2018-11-12 | Payer: Self-pay | Source: Home / Self Care | Admitting: Orthopedic Surgery

## 2018-11-12 ENCOUNTER — Encounter: Payer: Self-pay | Admitting: Anesthesiology

## 2018-11-12 ENCOUNTER — Inpatient Hospital Stay: Payer: Self-pay

## 2018-11-12 ENCOUNTER — Encounter
Admission: EM | Disposition: A | Discharge status: Home Health | Payer: Self-pay | Source: Home / Self Care | Attending: Internal Medicine

## 2018-11-12 LAB — URINE DRUG SCREEN, QUALITATIVE (ARMC ONLY)
Amphetamines, Ur Screen: NOT DETECTED
Barbiturates, Ur Screen: NOT DETECTED
Benzodiazepine, Ur Scrn: POSITIVE — AB
Cannabinoid 50 Ng, Ur ~~LOC~~: POSITIVE — AB
Cocaine Metabolite,Ur ~~LOC~~: NOT DETECTED
MDMA (Ecstasy)Ur Screen: NOT DETECTED
Methadone Scn, Ur: NOT DETECTED
Opiate, Ur Screen: POSITIVE — AB
Phencyclidine (PCP) Ur S: NOT DETECTED
Tricyclic, Ur Screen: NOT DETECTED

## 2018-11-12 LAB — BASIC METABOLIC PANEL
Anion gap: 15 (ref 5–15)
BUN: 19 mg/dL (ref 6–20)
CO2: 23 mmol/L (ref 22–32)
Calcium: 9.2 mg/dL (ref 8.9–10.3)
Chloride: 100 mmol/L (ref 98–111)
Creatinine, Ser: 0.81 mg/dL (ref 0.61–1.24)
GFR calc Af Amer: >60 mL/min (ref >60)
GFR calc non Af Amer: >60 mL/min (ref >60)
Glucose, Bld: 105 mg/dL — ABNORMAL HIGH (ref 70–99)
Potassium: 3.8 mmol/L (ref 3.5–5.1)
Sodium: 138 mmol/L (ref 135–145)

## 2018-11-12 LAB — CBC
HCT: 40.5 % (ref 39.0–52.0)
HCT: 44.8 % (ref 39.0–52.0)
Hemoglobin: 13.6 g/dL (ref 13.0–17.0)
Hemoglobin: 14 g/dL (ref 13.0–17.0)
MCH: 30.8 pg (ref 26.0–34.0)
MCH: 30.3 pg (ref 26.0–34.0)
MCHC: 33.6 g/dL (ref 30.0–36.0)
MCHC: 31.3 g/dL (ref 30.0–36.0)
MCV: 91.8 fL (ref 80.0–100.0)
MCV: 97 fL (ref 80.0–100.0)
Platelets: 344 10*3/uL (ref 150–400)
Platelets: 326 10*3/uL (ref 150–400)
RBC: 4.41 MIL/uL (ref 4.22–5.81)
RBC: 4.62 MIL/uL (ref 4.22–5.81)
RDW: 13.1 % (ref 11.5–15.5)
RDW: 13.4 % (ref 11.5–15.5)
WBC: 9.7 10*3/uL (ref 4.0–10.5)
WBC: 10.2 10*3/uL (ref 4.0–10.5)
nRBC: 0 % (ref 0.0–0.2)
nRBC: 0 % (ref 0.0–0.2)

## 2018-11-12 LAB — LACTIC ACID, PLASMA
Lactic Acid, Venous: 0.9 mmol/L (ref 0.5–1.9)
Lactic Acid, Venous: 1.4 mmol/L (ref 0.5–1.9)

## 2018-11-12 LAB — SARS CORONAVIRUS 2 BY RT PCR (HOSPITAL ORDER, PERFORMED IN ~~LOC~~ HOSPITAL LAB): SARS Coronavirus 2: NEGATIVE

## 2018-11-12 LAB — HIV ANTIBODY (ROUTINE TESTING W REFLEX): HIV Screen 4th Generation wRfx: NONREACTIVE

## 2018-11-12 LAB — INFLUENZA PANEL BY PCR (TYPE A & B)
Influenza A By PCR: NEGATIVE
Influenza B By PCR: NEGATIVE

## 2018-11-12 LAB — PROCALCITONIN: Procalcitonin: <0.1 ng/mL

## 2018-11-12 SURGERY — KYPHOPLASTY
Anesthesia: Monitor Anesthesia Care

## 2018-11-12 MED ORDER — FENTANYL CITRATE (PF) 100 MCG/2ML IJ SOLN
INTRAMUSCULAR | Status: AC
  Filled 2018-11-12: qty 2

## 2018-11-12 MED ORDER — SODIUM CHLORIDE FLUSH 0.9 % IV SOLN
INTRAVENOUS | Status: AC
  Administered 2018-11-12: 18:00:00
  Filled 2018-11-12: qty 10

## 2018-11-12 MED ORDER — CEFAZOLIN SODIUM-DEXTROSE 1-4 GM/50ML-% IV SOLN
1.0000 g | Freq: Once | INTRAVENOUS | Status: AC
  Administered 2018-11-13: 1 g via INTRAVENOUS
  Filled 2018-11-12: qty 50

## 2018-11-12 MED ORDER — CEFAZOLIN SODIUM-DEXTROSE 1-4 GM/50ML-% IV SOLN
INTRAVENOUS | Status: AC
  Filled 2018-11-12: qty 50

## 2018-11-12 MED ORDER — SODIUM CHLORIDE 0.9 % IV SOLN
INTRAVENOUS | Status: DC
  Administered 2018-11-12 – 2018-11-13 (×2): via INTRAVENOUS

## 2018-11-12 MED ORDER — COLCHICINE 0.6 MG PO TABS
0.6000 mg | ORAL_TABLET | Freq: Two times a day (BID) | ORAL | Status: DC
  Administered 2018-11-12 – 2018-11-13 (×3): 0.6 mg via ORAL
  Filled 2018-11-12 (×4): qty 1

## 2018-11-12 MED ORDER — MIDAZOLAM HCL 2 MG/2ML IJ SOLN
INTRAMUSCULAR | Status: AC
  Filled 2018-11-12: qty 2

## 2018-11-12 SURGICAL SUPPLY — 17 items
COVER WAND RF STERILE (DRAPES) ×2 IMPLANT
DERMABOND ADVANCED (GAUZE/BANDAGES/DRESSINGS) ×1
DERMABOND ADVANCED .7 DNX12 (GAUZE/BANDAGES/DRESSINGS) ×1 IMPLANT
DEVICE BIOPSY BONE KYPHX (INSTRUMENTS) ×2 IMPLANT
DRAPE C-ARM XRAY 36X54 (DRAPES) ×2 IMPLANT
DURAPREP 26ML APPLICATOR (WOUND CARE) ×2 IMPLANT
GLOVE SURG SYN 9.0  PF PI (GLOVE) ×1
GLOVE SURG SYN 9.0 PF PI (GLOVE) ×1 IMPLANT
GOWN SRG 2XL LVL 4 RGLN SLV (GOWNS) ×1 IMPLANT
GOWN STRL NON-REIN 2XL LVL4 (GOWNS) ×1
GOWN STRL REUS W/ TWL LRG LVL3 (GOWN DISPOSABLE) ×1 IMPLANT
GOWN STRL REUS W/TWL LRG LVL3 (GOWN DISPOSABLE) ×1
PACK KYPHOPLASTY (MISCELLANEOUS) ×2 IMPLANT
RENTAL RFA GENERATOR (MISCELLANEOUS) IMPLANT
STRAP SAFETY 5IN WIDE (MISCELLANEOUS) ×2 IMPLANT
TRAY KYPHOPAK 15/3 EXPRESS 1ST (MISCELLANEOUS) ×2 IMPLANT
TRAY KYPHOPAK 20/3 EXPRESS 1ST (MISCELLANEOUS) ×2 IMPLANT

## 2018-11-12 NOTE — Consult Note (Signed)
Writer visited patient but interview was limited as patient was scheduled for surgery shortly.  Patient stated that he was feeling slightly nervous but ready to have the procedure.  Patient states that his mood was okay although complicated by the pain he was experiencing.  Patient denied any suicidal ideation.  He did acknowledge that his mood could use work and he was now open to the idea of increasing his antidepressant.  It was discussed with patient that we would talk about this further after his surgery.  Review of systems Patient denies hallucinations Patient denies insomnia Patient denies suicidal ideation Patient endorses back pain Patient denies cough Patient denies bloody nose Patient endorses muscle pains  Mental status exam: Patient is alert and oriented laying down in bed makes good eye contact.  Mood is somewhat nervous affect is congruent mood. speech is normal with regular rhythm and rate and volume.  Thought processes linear thought content is appropriate for conversation.  Patient denies any suicidal ideation  We will continue to follow-up with patient and assess need for increase in medication. Brief psycho supportive therapy offered

## 2018-11-12 NOTE — Progress Notes (Signed)
Radiology attempted to do chest x-ray. Pt refused.

## 2018-11-12 NOTE — Progress Notes (Signed)
Patient returned to unit from procedural area where kyphoplasty was scheduled. Procedural area reported tympanic temp of 102.2. Patient temp checked upon return to unit and Oral temp found to be 100.1. The correct temp was reported to procedural area to see if they wanted to continue. Radiologist requested that hospitalist conduct "a full evaluation" of why patient is febrile. Hospitalist contacted with wishes from Radiology and if any further questions regarding "full workup", to contact Radiology.  Patient has been laying in bed since admit, not turning or repositioning. Patient has been instructed to practice deep breathing. Pt remarks his back hurts and he can't deep breathe. Incentive spirometry at bedside and patient has been instructed on use. Advised patient that his temp is coming from being in bed, not deep breathing, no turning, no repositioning and that he needs to do IS.   Patient has maintained that he wanted his knee surgery before his back was repaired. Explained that today's procedure was for his back.  Then patient stated his gout was flairing and wanted med for that.  Explained to patient that he could not have had med at this time due to close proximity of intended kyphoplasty.  Dr. Rudene Christians in to see patient and told patient he did not need knee surgery.  At this time, tentative plan is to have kyphoplasty tomorrow.

## 2018-11-12 NOTE — Progress Notes (Signed)
MRI shows L1 and L2 compression fractures.  Plan kyphoplasty at both levels today.

## 2018-11-12 NOTE — OR Nursing (Signed)
Patient with tympanic temp of 102.2 checked twice. Dr. Rudene Christians and Dr. Ronelle Nigh notified. Dr. Ronelle Nigh reports surgery is cancelled until reason for temp. Can be investigated.

## 2018-11-12 NOTE — Progress Notes (Signed)
In to do flu A and B as ordered and Repeat Covid test.  Explained to patient procedure for both were similar but one was deeper. Pt states he just had Covid testing. Advised patient that since he had low grade temp of 100.1 that Md gave orders to repeat Covid testing.  Patient screams out as testing is done. Pt comments that we need to be fired. Patient verbally abusive to this scriber with "F" word.  Testing completed. Patient states he requested pain med. Unit secretary checked her call list and no call identified. No call to this nurses phone.    CN checked and currently not time for pain med to be repeated. Patient states his nose is broken from previous Covid testing and now its made worse.  Patient demonstrated with his entire arm the method he believes was used in obtaining his nasal swabs.(Vigirous "digging in and out " patient states).  X-ray in to obtain chest xray ordered as a result of his earlier temp of 100.1. He declined to allow the x-ray team to slide the plate under him. Pt stated "if I have pneumonia, it's not my fault"  X-ray stated to nursing that they would come back later.  Call bell at pt side.

## 2018-11-12 NOTE — Progress Notes (Signed)
Patient was brought to surgery and found to have a temperature over 102 and anesthesia will not give anesthesia with a elevated temperature.  Will cancel surgery for today and hope that he is improved tomorrow.

## 2018-11-12 NOTE — Progress Notes (Signed)
[facility] [place] [facility]   PATIENT NAME: Timothy SieveKenneth Pham    MR#:  [id number]  DATE OF BIRTH:  07/18/1965  SUBJECTIVE:  CHIEF COMPLAINT: Patient is complaining of back pain and left knee pain Kyphoplasty canceled today as patient was febrile prior to the procedure with tympanic temperature 102.2 REVIEW OF SYSTEMS:  CONSTITUTIONAL: He has fever, fatigue or weakness.  EYES: No blurred or double vision.  EARS, NOSE, AND THROAT: No tinnitus or ear pain.  RESPIRATORY: No cough, shortness of breath, wheezing or hemoptysis.  CARDIOVASCULAR: No chest pain, orthopnea, edema.  GASTROINTESTINAL: No nausea, vomiting, diarrhea or abdominal pain.  GENITOURINARY: No dysuria, hematuria.  ENDOCRINE: No polyuria, nocturia,  HEMATOLOGY: No anemia, easy bruising or bleeding SKIN: No rash or lesion. MUSCULOSKELETAL: Back pain and knee pain on the left side NEUROLOGIC: No tingling, numbness, weakness.  PSYCHIATRY: No anxiety or depression.   DRUG ALLERGIES:   No Active Allergies  VITALS:  Blood pressure 130/77, pulse 73, temperature 100.1 F (37.8 C), temperature source Oral, resp. rate 17, height 5\' 5"  (1.651 m), weight 68.9 kg, SpO2 99 %.  PHYSICAL EXAMINATION:  GENERAL:  53 y.o.-year-old patient lying in the bed with no acute distress.  EYES: Pupils equal, round, reactive to light and accommodation. No scleral icterus. Extraocular muscles intact.  HEENT: Head atraumatic, normocephalic. Oropharynx and nasopharynx clear.  NECK:  Supple, no jugular venous distention. No thyroid enlargement, no tenderness.  LUNGS: Normal breath sounds bilaterally, no wheezing, rales,rhonchi or crepitation. No use of accessory muscles of respiration.  CARDIOVASCULAR: S1, S2 normal. No murmurs, rubs, or gallops.  ABDOMEN: Soft, nontender, nondistended. Bowel sounds present.  EXTREMITIES: Left knee is extended and edematous, no pedal edema, cyanosis, or clubbing.  Lumbar vertebral area  is tender to touch NEUROLOGIC: Cranial nerves II through XII are intact. Muscle strength weak in all extremities mainly left knee area is very tender sensation intact. Gait not checked.  PSYCHIATRIC: The patient is alert and oriented x 3.  SKIN: No obvious rash, lesion, or ulcer.    LABORATORY PANEL:   CBC Recent Labs  Lab 11/12/18 0432  WBC 9.7  HGB 13.6  HCT 40.5  PLT 344   ------------------------------------------------------------------------------------------------------------------  Chemistries  Recent Labs  Lab 11/12/18 0432  NA 138  K 3.8  CL 100  CO2 23  GLUCOSE 105*  BUN 19  CREATININE 0.81  CALCIUM 9.2   ------------------------------------------------------------------------------------------------------------------  Cardiac Enzymes No results for input(s): TROPONINI in the last 168 hours. ------------------------------------------------------------------------------------------------------------------  RADIOLOGY:  Mr Lumbar Spine Wo Contrast  Result Date: 11/11/2018 CLINICAL DATA:  Initial evaluation for acute traumatic injury, fracture. EXAM: MRI LUMBAR SPINE WITHOUT CONTRAST TECHNIQUE: Multiplanar, multisequence MR imaging of the lumbar spine was performed. No intravenous contrast was administered. COMPARISON:  Prior CT from 11/10/2018. FINDINGS: Segmentation: Standard. Lowest well-formed disc labeled the L5-S1 level. Alignment: Physiologic with preservation of the normal lumbar lordosis. No listhesis or subluxation. Vertebrae: Acute compression fracture involving the superior endplate of L2 with approximately 30% height loss and trace bony retropulsion, stable from prior CT. This is benign/mechanical in appearance. There is an additional compression fracture extending through the central and left aspect of the inferior endplate of L1, not visible on prior CT. No significant height loss or bony retropulsion. Chronic severe compression deformity of T12 again  noted, unchanged. Vertebral body heights otherwise maintained with no other acute or subacute fracture. Underlying bone marrow signal intensity within normal limits. No discrete or worrisome osseous lesions. Conus  medullaris and cauda equina: Conus extends to the L1-2 level. Conus and cauda equina appear normal. Paraspinal and other soft tissues: Paraspinous soft tissues within normal limits. Visualized visceral structures unremarkable. Disc levels: T11-12: Mild bony retropulsion related to the chronic T12 compression fracture, flattening the ventral thecal sac, greater on the right. No significant spinal stenosis. Foramina remain patent. T12-L1: Chronic 5 mm bony retropulsion related to the T12 compression fracture flattening and partially effacing the ventral thecal sac. Mild spinal stenosis without cord impingement. Foramina remain patent. L1-2: Mild diffuse disc bulge, asymmetric to the left. Mild left-sided reactive endplate changes. No significant spinal stenosis. Moderate left L1 foraminal narrowing. L2-3: Mild disc bulge with disc desiccation. No canal or foraminal stenosis. L3-4: Mild disc bulge with disc desiccation. Mild facet hypertrophy. No canal or foraminal stenosis. L4-5: Disc desiccation without significant disc bulge. Mild facet hypertrophy. No canal or foraminal stenosis. L5-S1: Negative interspace. Mild facet hypertrophy. No canal or foraminal stenosis. IMPRESSION: 1. Acute compression fracture involving the superior endplate of L2 with 30% height loss and trace bony retropulsion, with no significant spinal stenosis. This is benign/mechanical in appearance. 2. Additional compression deformity involving the central and left aspect of the L1 inferior endplate without significant height loss or bony retropulsion. 3. Severe chronic compression deformity involving T12, stable. Electronically Signed   By: Rise Mu M.D.   On: 11/11/2018 22:17   Mr Knee Left Wo Contrast  Result Date:  11/12/2018 CLINICAL DATA:  Injury to the knee last week at work EXAM: MRI OF THE LEFT KNEE WITHOUT CONTRAST TECHNIQUE: Multiplanar, multisequence MR imaging of the knee was performed. No intravenous contrast was administered. COMPARISON:  None. FINDINGS: MENISCI Medial: There is an undersurface nondisplaced horizontal tear seen of the posterior horn of the medial meniscus. Lateral: Intact. LIGAMENTS Cruciates: The ACL is intact. The PCL is intact. Collaterals: The MCL is intact. There is intrasubstance signal changes seen at the popliteus tendon at the insertion site. The remainder of the lateral collateral ligamentous complex is intact. CARTILAGE Patellofemoral: Mild chondral fissuring seen throughout the patellar surface. Medial compartment: Mild chondral thinning seen the weight-bearing surface of the medial femoral condyle. Lateral compartment: Mild chondral thinning seen weight-bearing surface of the lateral femoral condyle. BONES: There is increased marrow signal seen throughout the lateral femoral condyle. There is also mildly increased signal seen lateral tibial plateau. No avascular necrosis. No pathologic marrow infiltration. JOINT: A moderate knee joint effusion is seen with debris edema seen within Hoffa's fat pad. No plical thickening. EXTENSOR MECHANISM: The patellar and quadriceps tendon are intact. The retinaculum is unremarkable. POPLITEAL FOSSA: No popliteal cyst. OTHER: There is also a mildly feathery signal seen in the popliteus muscle belly. Anterolateral and posterior lateral subcutaneous edema is noted. IMPRESSION: 1. Nondisplaced under surface tear of the posterior medial meniscus. 2. Intrasubstance sprain of the popliteus tendon with edema around the posterolateral corner of the knee. 3. Mild muscular edema of the popliteus 4. Osseous contusion involving the lateral femoral condyle and lateral tibial plateau. 5. Intact cruciate ligaments 6. Moderate knee joint effusion with debris 7. Mild  tricompartmental chondral disease Electronically Signed   By: Jonna Clark M.D.   On: 11/12/2018 08:06    EKG:   Orders placed or performed during the hospital encounter of 01/19/18  . ED EKG  . ED EKG  . ED EKG  . ED EKG  . EKG 12-Lead  . EKG 12-Lead    ASSESSMENT AND PLAN:    Acute L2  compression fracture with 30% weight loss, L1 compression fracture and chronic T12 compression fracture s/p mechanical fall -Orthopedic surgery consult- discussed with Dr. Vernona Rieger for kyphoplasty on Thursday (last took plavix 9/21 at 0800) -Pain control as needed -Patient has a back brace in place -Obtain PT consult  after kyphoplasty -MRI of the lumbar spine done results noted -His procedure is canceled 9 [id number] as patient was febrile with temperature 102.2   Fever-could be from atelectasis patient is not taking deep breaths from back pain No leukocytosis, not hypotensive no tachycardia no tachypnea We will check lactic acid, procalcitonin, CBC Blood cultures, urine cultures and chest x-ray Incentive spirometry Not considering antibiotics at this time   Left knee injury- likely ligamentous tear. -Left knee x-rays with a moderate knee effusion -MRI of the left knee has revealed nondisplaced tear of the posterior medial meniscus -Orthopedic consult as above  Leukocytosis- likely a stress reaction.  Resolved   History of stroke- no residual deficits -Continue aspirin -Hold Plavix until evaluated by orthopedic surgery  Hypertension-BP normal in the ED -Continue home Norvasc  Hyperlipidemia-stable -Continue home Lipitor  History of gout-no acute flare -Continue home allopurinol  Bipolar 1 disorder-stable -Continue home Wellbutrin  DVT prophylaxis- SCDs until evaluated by surgery    All the records are reviewed and case discussed with Care Management/Social Workerr. Management plans discussed with the patient, he is in agreement.  CODE STATUS: fc   TOTAL TIME  TAKING CARE OF THIS PATIENT: 35  minutes.   POSSIBLE D/C IN 2 DAYS, DEPENDING ON CLINICAL CONDITION.  Note: This dictation was prepared with Dragon dictation along with smaller phrase technology. Any transcriptional errors that result from this process are unintentional.   Nicholes Mango M.D on 11/12/2018 at 3:32 PM  Between 7am to 6pm - Pager - [phone] After 6pm go to [email or web address] - password [email or web address] [facility]  [place] [facility]  Office  [phone]  CC: Primary care physician; [facility]

## 2018-11-13 ENCOUNTER — Inpatient Hospital Stay: Payer: Self-pay

## 2018-11-13 ENCOUNTER — Encounter
Admission: EM | Disposition: A | Discharge status: Home Health | Payer: Self-pay | Source: Home / Self Care | Attending: Internal Medicine

## 2018-11-13 ENCOUNTER — Inpatient Hospital Stay: Payer: Self-pay | Admitting: Anesthesiology

## 2018-11-13 ENCOUNTER — Encounter: Payer: Self-pay | Admitting: Certified Registered Nurse Anesthetist

## 2018-11-13 HISTORY — PX: KYPHOPLASTY: SHX5884

## 2018-11-13 LAB — CBC WITH DIFFERENTIAL/PLATELET
Abs Immature Granulocytes: 0.04 10*3/uL (ref 0.00–0.07)
Basophils Absolute: 0 10*3/uL (ref 0.0–0.1)
Basophils Relative: 0 %
Eosinophils Absolute: 0.3 10*3/uL (ref 0.0–0.5)
Eosinophils Relative: 3 %
HCT: 41 % (ref 39.0–52.0)
Hemoglobin: 13.5 g/dL (ref 13.0–17.0)
Immature Granulocytes: 0 %
Lymphocytes Relative: 14 %
Lymphs Abs: 1.3 10*3/uL (ref 0.7–4.0)
MCH: 30.5 pg (ref 26.0–34.0)
MCHC: 32.9 g/dL (ref 30.0–36.0)
MCV: 92.8 fL (ref 80.0–100.0)
Monocytes Absolute: 1.1 10*3/uL — ABNORMAL HIGH (ref 0.1–1.0)
Monocytes Relative: 12 %
Neutro Abs: 6.7 10*3/uL (ref 1.7–7.7)
Neutrophils Relative %: 71 %
Platelets: 350 10*3/uL (ref 150–400)
RBC: 4.42 MIL/uL (ref 4.22–5.81)
RDW: 13.2 % (ref 11.5–15.5)
WBC: 9.5 10*3/uL (ref 4.0–10.5)
nRBC: 0 % (ref 0.0–0.2)

## 2018-11-13 LAB — BASIC METABOLIC PANEL
Anion gap: 11 (ref 5–15)
BUN: 20 mg/dL (ref 6–20)
CO2: 27 mmol/L (ref 22–32)
Calcium: 9.3 mg/dL (ref 8.9–10.3)
Chloride: 101 mmol/L (ref 98–111)
Creatinine, Ser: 0.95 mg/dL (ref 0.61–1.24)
GFR calc Af Amer: >60 mL/min (ref >60)
GFR calc non Af Amer: >60 mL/min (ref >60)
Glucose, Bld: 114 mg/dL — ABNORMAL HIGH (ref 70–99)
Potassium: 3.5 mmol/L (ref 3.5–5.1)
Sodium: 139 mmol/L (ref 135–145)

## 2018-11-13 LAB — PROCALCITONIN: Procalcitonin: <0.1 ng/mL

## 2018-11-13 SURGERY — KYPHOPLASTY
Anesthesia: General

## 2018-11-13 MED ORDER — LIDOCAINE HCL 1 % IJ SOLN
INTRAMUSCULAR | Status: DC | PRN
  Administered 2018-11-13: 30 mL

## 2018-11-13 MED ORDER — OXYCODONE HCL 5 MG/5ML PO SOLN: 5.0000 mg | Freq: Once | ORAL | Status: AC | PRN

## 2018-11-13 MED ORDER — CEFAZOLIN SODIUM-DEXTROSE 1-4 GM/50ML-% IV SOLN
INTRAVENOUS | Status: AC
  Filled 2018-11-13: qty 50

## 2018-11-13 MED ORDER — CLOPIDOGREL BISULFATE 75 MG PO TABS
75.0000 mg | ORAL_TABLET | Freq: Every day | ORAL | Status: DC
  Administered 2018-11-14: 75 mg via ORAL
  Filled 2018-11-13: qty 1

## 2018-11-13 MED ORDER — OXYCODONE HCL 5 MG PO TABS
ORAL_TABLET | ORAL | Status: AC
  Filled 2018-11-13: qty 1

## 2018-11-13 MED ORDER — BUPIVACAINE-EPINEPHRINE (PF) 0.5% -1:200000 IJ SOLN
INTRAMUSCULAR | Status: DC | PRN
  Administered 2018-11-13: 30 mL

## 2018-11-13 MED ORDER — METHOCARBAMOL 500 MG PO TABS
500.0000 mg | ORAL_TABLET | Freq: Four times a day (QID) | ORAL | Status: DC | PRN
  Administered 2018-11-13: 500 mg via ORAL
  Filled 2018-11-13: qty 1

## 2018-11-13 MED ORDER — MAGNESIUM HYDROXIDE 400 MG/5ML PO SUSP: 30.0000 mL | Freq: Every day | ORAL | Status: DC | PRN

## 2018-11-13 MED ORDER — ZOLPIDEM TARTRATE 5 MG PO TABS: 5.0000 mg | ORAL_TABLET | Freq: Every evening | ORAL | Status: DC | PRN

## 2018-11-13 MED ORDER — LIDOCAINE HCL (CARDIAC) PF 100 MG/5ML IV SOSY
PREFILLED_SYRINGE | INTRAVENOUS | Status: DC | PRN
  Administered 2018-11-13: 100 mg via INTRAVENOUS

## 2018-11-13 MED ORDER — OXYCODONE HCL 5 MG PO TABS
5.0000 mg | ORAL_TABLET | Freq: Once | ORAL | Status: AC | PRN
  Administered 2018-11-13: 5 mg via ORAL

## 2018-11-13 MED ORDER — PROPOFOL 10 MG/ML IV BOLUS
INTRAVENOUS | Status: DC | PRN
  Administered 2018-11-13: 100 mg via INTRAVENOUS

## 2018-11-13 MED ORDER — FENTANYL CITRATE (PF) 100 MCG/2ML IJ SOLN
INTRAMUSCULAR | Status: DC | PRN
  Administered 2018-11-13: 100 ug via INTRAVENOUS

## 2018-11-13 MED ORDER — METHOCARBAMOL 1000 MG/10ML IJ SOLN
500.0000 mg | Freq: Four times a day (QID) | INTRAVENOUS | Status: DC | PRN
  Filled 2018-11-13: qty 5

## 2018-11-13 MED ORDER — ROCURONIUM BROMIDE 100 MG/10ML IV SOLN
INTRAVENOUS | Status: DC | PRN
  Administered 2018-11-13: 30 mg via INTRAVENOUS

## 2018-11-13 MED ORDER — MAGNESIUM CITRATE PO SOLN
1.0000 | Freq: Once | ORAL | Status: DC | PRN
  Filled 2018-11-13: qty 296

## 2018-11-13 MED ORDER — DEXAMETHASONE SODIUM PHOSPHATE 10 MG/ML IJ SOLN
INTRAMUSCULAR | Status: DC | PRN
  Administered 2018-11-13: 10 mg via INTRAVENOUS

## 2018-11-13 MED ORDER — MIDAZOLAM HCL 2 MG/2ML IJ SOLN
INTRAMUSCULAR | Status: DC | PRN
  Administered 2018-11-13: 2 mg via INTRAVENOUS

## 2018-11-13 MED ORDER — BISACODYL 5 MG PO TBEC
5.0000 mg | DELAYED_RELEASE_TABLET | Freq: Every day | ORAL | Status: DC | PRN
  Administered 2018-11-14: 5 mg via ORAL
  Filled 2018-11-13: qty 1

## 2018-11-13 MED ORDER — FENTANYL CITRATE (PF) 100 MCG/2ML IJ SOLN: 25.0000 ug | INTRAMUSCULAR | Status: DC | PRN

## 2018-11-13 MED ORDER — PROMETHAZINE HCL 25 MG/ML IJ SOLN: 6.2500 mg | INTRAMUSCULAR | Status: DC | PRN

## 2018-11-13 MED ORDER — PHENYLEPHRINE HCL (PRESSORS) 10 MG/ML IV SOLN
INTRAVENOUS | Status: DC | PRN
  Administered 2018-11-13 (×2): 100 ug via INTRAVENOUS
  Administered 2018-11-13: 150 ug via INTRAVENOUS
  Administered 2018-11-13 (×3): 100 ug via INTRAVENOUS

## 2018-11-13 MED ORDER — EPHEDRINE SULFATE 50 MG/ML IJ SOLN
INTRAMUSCULAR | Status: DC | PRN
  Administered 2018-11-13: 15 mg via INTRAVENOUS

## 2018-11-13 MED ORDER — SODIUM CHLORIDE 0.9 % IV SOLN
INTRAVENOUS | Status: DC
  Administered 2018-11-13: 15:00:00 via INTRAVENOUS

## 2018-11-13 MED ORDER — ONDANSETRON HCL 4 MG/2ML IJ SOLN
INTRAMUSCULAR | Status: DC | PRN
  Administered 2018-11-13: 4 mg via INTRAVENOUS

## 2018-11-13 MED ORDER — DOCUSATE SODIUM 100 MG PO CAPS
100.0000 mg | ORAL_CAPSULE | Freq: Two times a day (BID) | ORAL | Status: DC
  Administered 2018-11-13 – 2018-11-14 (×2): 100 mg via ORAL
  Filled 2018-11-13 (×2): qty 1

## 2018-11-13 MED ORDER — METOCLOPRAMIDE HCL 5 MG/ML IJ SOLN: 5.0000 mg | Freq: Three times a day (TID) | INTRAMUSCULAR | Status: DC | PRN

## 2018-11-13 MED ORDER — METOCLOPRAMIDE HCL 10 MG PO TABS: 5.0000 mg | ORAL_TABLET | Freq: Three times a day (TID) | ORAL | Status: DC | PRN

## 2018-11-13 MED ORDER — MEPERIDINE HCL 50 MG/ML IJ SOLN: 6.2500 mg | INTRAMUSCULAR | Status: DC | PRN

## 2018-11-13 MED ORDER — COLCHICINE 0.6 MG PO TABS
0.6000 mg | ORAL_TABLET | Freq: Four times a day (QID) | ORAL | Status: DC
  Administered 2018-11-13 – 2018-11-14 (×3): 0.6 mg via ORAL
  Filled 2018-11-13 (×3): qty 1

## 2018-11-13 SURGICAL SUPPLY — 19 items
CEMENT KYPHON CX01A KIT/MIXER (Cement) ×2 IMPLANT
COVER WAND RF STERILE (DRAPES) ×2 IMPLANT
DERMABOND ADVANCED (GAUZE/BANDAGES/DRESSINGS) ×1
DERMABOND ADVANCED .7 DNX12 (GAUZE/BANDAGES/DRESSINGS) ×1 IMPLANT
DEVICE BIOPSY BONE KYPHX (INSTRUMENTS) ×2 IMPLANT
DRAPE C-ARM XRAY 36X54 (DRAPES) ×2 IMPLANT
DURAPREP 26ML APPLICATOR (WOUND CARE) ×2 IMPLANT
FEE RENTAL RFA GENERATOR (MISCELLANEOUS) IMPLANT
GLOVE SURG SYN 9.0  PF PI (GLOVE) ×1
GLOVE SURG SYN 9.0 PF PI (GLOVE) ×1 IMPLANT
GOWN SRG 2XL LVL 4 RGLN SLV (GOWNS) ×1 IMPLANT
GOWN STRL NON-REIN 2XL LVL4 (GOWNS) ×1
GOWN STRL REUS W/ TWL LRG LVL3 (GOWN DISPOSABLE) ×1 IMPLANT
GOWN STRL REUS W/TWL LRG LVL3 (GOWN DISPOSABLE) ×1
PACK KYPHOPLASTY (MISCELLANEOUS) ×2 IMPLANT
RENTAL RFA GENERATOR (MISCELLANEOUS) IMPLANT
STRAP SAFETY 5IN WIDE (MISCELLANEOUS) ×2 IMPLANT
TRAY KYPHOPAK 15/3 EXPRESS 1ST (MISCELLANEOUS) ×1 IMPLANT
TRAY KYPHOPAK 20/3 EXPRESS 1ST (MISCELLANEOUS) ×3 IMPLANT

## 2018-11-13 NOTE — Progress Notes (Signed)
Received po pain medication   Oxygen sats  Are low   IS device encouraged   Dr Randa Lynn aware

## 2018-11-13 NOTE — Progress Notes (Signed)
Pt very unclear with pain level description   Pt states 10 in minute  Next says not to bad   Oxycodone given po due to low saturations

## 2018-11-13 NOTE — Progress Notes (Signed)
[facility] [place] [facility]   PATIENT NAME: Timothy SieveKenneth Pham    MR#:  [id number]  DATE OF BIRTH:  10/03/1965  SUBJECTIVE:  CHIEF COMPLAINT: Patient is complaining of back pain and left knee pain, refused to get chest x-ray Other episodes of fever REVIEW OF SYSTEMS:  CONSTITUTIONAL: He has fever, fatigue or weakness.  EYES: No blurred or double vision.  EARS, NOSE, AND THROAT: No tinnitus or ear pain.  RESPIRATORY: No cough, shortness of breath, wheezing or hemoptysis.  CARDIOVASCULAR: No chest pain, orthopnea, edema.  GASTROINTESTINAL: No nausea, vomiting, diarrhea or abdominal pain.  GENITOURINARY: No dysuria, hematuria.  ENDOCRINE: No polyuria, nocturia,  HEMATOLOGY: No anemia, easy bruising or bleeding SKIN: No rash or lesion. MUSCULOSKELETAL: Back pain and knee pain on the left side NEUROLOGIC: No tingling, numbness, weakness.  PSYCHIATRY: No anxiety or depression.   DRUG ALLERGIES:   No Active Allergies  VITALS:  Blood pressure 117/70, pulse 79, temperature 97.8 F (36.6 C), resp. rate 16, height 5\' 5"  (1.651 m), weight 68.9 kg, SpO2 92 %.  PHYSICAL EXAMINATION:  GENERAL:  53 y.o.-year-old patient lying in the bed with no acute distress.  EYES: Pupils equal, round, reactive to light and accommodation. No scleral icterus. Extraocular muscles intact.  HEENT: Head atraumatic, normocephalic. Oropharynx and nasopharynx clear.  NECK:  Supple, no jugular venous distention. No thyroid enlargement, no tenderness.  LUNGS: Normal breath sounds bilaterally, no wheezing, rales,rhonchi or crepitation. No use of accessory muscles of respiration.  CARDIOVASCULAR: S1, S2 normal. No murmurs, rubs, or gallops.  ABDOMEN: Soft, nontender, nondistended. Bowel sounds present.  EXTREMITIES: Left knee is extended and edematous, no pedal edema, cyanosis, or clubbing.  Lumbar vertebral area is tender to touch NEUROLOGIC: Cranial nerves II through XII are intact.  Muscle strength weak in all extremities mainly left knee area is very tender sensation intact. Gait not checked.  PSYCHIATRIC: The patient is alert and oriented x 3.  SKIN: No obvious rash, lesion, or ulcer.    LABORATORY PANEL:   CBC Recent Labs  Lab 11/13/18 0426  WBC 9.5  HGB 13.5  HCT 41.0  PLT 350   ------------------------------------------------------------------------------------------------------------------  Chemistries  Recent Labs  Lab 11/13/18 0426  NA 139  K 3.5  CL 101  CO2 27  GLUCOSE 114*  BUN 20  CREATININE 0.95  CALCIUM 9.3   ------------------------------------------------------------------------------------------------------------------  Cardiac Enzymes No results for input(s): TROPONINI in the last 168 hours. ------------------------------------------------------------------------------------------------------------------  RADIOLOGY:  Dg Lumbar Spine 2-3 Views  Result Date: 11/13/2018 CLINICAL DATA:  Lumbar surgery. EXAM: LUMBAR SPINE - 2-3 VIEW; DG C-ARM 1-60 MIN Radiation exposure index: 13.8 mGy. COMPARISON:  None. FINDINGS: Five intraoperative fluoroscopic images were obtained of the lumbar spine. These images demonstrate bilateral kyphoplasty being performed at 2 adjacent lumbar vertebral bodies. IMPRESSION: Fluoroscopic guidance provided during lumbar kyphoplasty. Electronically Signed   By: Lupita RaiderJames  Green Jr M.D.   On: 11/13/2018 14:10   Mr Lumbar Spine Wo Contrast  Result Date: 11/11/2018 CLINICAL DATA:  Initial evaluation for acute traumatic injury, fracture. EXAM: MRI LUMBAR SPINE WITHOUT CONTRAST TECHNIQUE: Multiplanar, multisequence MR imaging of the lumbar spine was performed. No intravenous contrast was administered. COMPARISON:  Prior CT from 11/10/2018. FINDINGS: Segmentation: Standard. Lowest well-formed disc labeled the L5-S1 level. Alignment: Physiologic with preservation of the normal lumbar lordosis. No listhesis or subluxation.  Vertebrae: Acute compression fracture involving the superior endplate of L2 with approximately 30% height loss and trace bony retropulsion, stable from prior CT. This  is benign/mechanical in appearance. There is an additional compression fracture extending through the central and left aspect of the inferior endplate of L1, not visible on prior CT. No significant height loss or bony retropulsion. Chronic severe compression deformity of T12 again noted, unchanged. Vertebral body heights otherwise maintained with no other acute or subacute fracture. Underlying bone marrow signal intensity within normal limits. No discrete or worrisome osseous lesions. Conus medullaris and cauda equina: Conus extends to the L1-2 level. Conus and cauda equina appear normal. Paraspinal and other soft tissues: Paraspinous soft tissues within normal limits. Visualized visceral structures unremarkable. Disc levels: T11-12: Mild bony retropulsion related to the chronic T12 compression fracture, flattening the ventral thecal sac, greater on the right. No significant spinal stenosis. Foramina remain patent. T12-L1: Chronic 5 mm bony retropulsion related to the T12 compression fracture flattening and partially effacing the ventral thecal sac. Mild spinal stenosis without cord impingement. Foramina remain patent. L1-2: Mild diffuse disc bulge, asymmetric to the left. Mild left-sided reactive endplate changes. No significant spinal stenosis. Moderate left L1 foraminal narrowing. L2-3: Mild disc bulge with disc desiccation. No canal or foraminal stenosis. L3-4: Mild disc bulge with disc desiccation. Mild facet hypertrophy. No canal or foraminal stenosis. L4-5: Disc desiccation without significant disc bulge. Mild facet hypertrophy. No canal or foraminal stenosis. L5-S1: Negative interspace. Mild facet hypertrophy. No canal or foraminal stenosis. IMPRESSION: 1. Acute compression fracture involving the superior endplate of L2 with 30% height loss and  trace bony retropulsion, with no significant spinal stenosis. This is benign/mechanical in appearance. 2. Additional compression deformity involving the central and left aspect of the L1 inferior endplate without significant height loss or bony retropulsion. 3. Severe chronic compression deformity involving T12, stable. Electronically Signed   By: Rise Mu M.D.   On: 11/11/2018 22:17   Mr Knee Left Wo Contrast  Result Date: 11/12/2018 CLINICAL DATA:  Injury to the knee last week at work EXAM: MRI OF THE LEFT KNEE WITHOUT CONTRAST TECHNIQUE: Multiplanar, multisequence MR imaging of the knee was performed. No intravenous contrast was administered. COMPARISON:  None. FINDINGS: MENISCI Medial: There is an undersurface nondisplaced horizontal tear seen of the posterior horn of the medial meniscus. Lateral: Intact. LIGAMENTS Cruciates: The ACL is intact. The PCL is intact. Collaterals: The MCL is intact. There is intrasubstance signal changes seen at the popliteus tendon at the insertion site. The remainder of the lateral collateral ligamentous complex is intact. CARTILAGE Patellofemoral: Mild chondral fissuring seen throughout the patellar surface. Medial compartment: Mild chondral thinning seen the weight-bearing surface of the medial femoral condyle. Lateral compartment: Mild chondral thinning seen weight-bearing surface of the lateral femoral condyle. BONES: There is increased marrow signal seen throughout the lateral femoral condyle. There is also mildly increased signal seen lateral tibial plateau. No avascular necrosis. No pathologic marrow infiltration. JOINT: A moderate knee joint effusion is seen with debris edema seen within Hoffa's fat pad. No plical thickening. EXTENSOR MECHANISM: The patellar and quadriceps tendon are intact. The retinaculum is unremarkable. POPLITEAL FOSSA: No popliteal cyst. OTHER: There is also a mildly feathery signal seen in the popliteus muscle belly. Anterolateral and  posterior lateral subcutaneous edema is noted. IMPRESSION: 1. Nondisplaced under surface tear of the posterior medial meniscus. 2. Intrasubstance sprain of the popliteus tendon with edema around the posterolateral corner of the knee. 3. Mild muscular edema of the popliteus 4. Osseous contusion involving the lateral femoral condyle and lateral tibial plateau. 5. Intact cruciate ligaments 6. Moderate knee joint effusion with  debris 7. Mild tricompartmental chondral disease Electronically Signed   By: Prudencio Pair M.D.   On: 11/12/2018 08:06   Dg C-arm 1-60 Min  Result Date: 11/13/2018 CLINICAL DATA:  Lumbar surgery. EXAM: LUMBAR SPINE - 2-3 VIEW; DG C-ARM 1-60 MIN Radiation exposure index: 13.8 mGy. COMPARISON:  None. FINDINGS: Five intraoperative fluoroscopic images were obtained of the lumbar spine. These images demonstrate bilateral kyphoplasty being performed at 2 adjacent lumbar vertebral bodies. IMPRESSION: Fluoroscopic guidance provided during lumbar kyphoplasty. Electronically Signed   By: Marijo Conception M.D.   On: 11/13/2018 14:10    EKG:   Orders placed or performed during the hospital encounter of 01/19/18  . ED EKG  . ED EKG  . ED EKG  . ED EKG  . EKG 12-Lead  . EKG 12-Lead    ASSESSMENT AND PLAN:    Acute L2 compression fracture with 30% weight loss, L1 compression fracture and chronic T12 compression fracture s/p mechanical fall -Orthopedic surgery consult- discussed with Dr. Vernona Rieger for kyphoplasty on Thursday (last took plavix 9/21 at 0800) -Pain control as needed -Patient has a back brace in place -Obtain PT consult  after kyphoplasty -MRI of the lumbar spine done results noted -His procedure is canceled 9 2420 as patient was febrile with temperature 102.2, but afebrile afterwards -L1-L2 kyphoplasty done and biopsies were taken by Dr. Rudene Christians sent for pathology   Fever-could be from atelectasis patient is not taking deep breaths from back pain No leukocytosis, not  hypotensive no tachycardia no tachypnea Peak acid and procalcitonin are normal and no leukocytosis on 24th and 25 September  Blood cultures were negative, urine cultures pending and patient refused to get chest x-ray Incentive spirometry Not considering antibiotics at this time   Left knee injury- likely ligamentous tear. -Left knee x-rays with a moderate knee effusion -MRI of the left knee has revealed nondisplaced tear of the posterior medial meniscus -Orthopedic consult as above  Leukocytosis- likely a stress reaction.  Resolved   History of stroke- no residual deficits -Continue aspirin -Hold Plavix until evaluated by orthopedic surgery  Hypertension-BP normal in the ED -Continue home Norvasc  Hyperlipidemia-stable -Continue home Lipitor  History of gout-no acute flare -Continue home allopurinol  Bipolar 1 disorder-stable -Continue home Wellbutrin  DVT prophylaxis- SCDs until evaluated by surgery    All the records are reviewed and case discussed with Care Management/Social Workerr. Management plans discussed with the patient, he is in agreement.  CODE STATUS: fc   TOTAL TIME TAKING CARE OF THIS PATIENT: 34  minutes.   POSSIBLE D/C IN 2 DAYS, DEPENDING ON CLINICAL CONDITION.  Note: This dictation was prepared with Dragon dictation along with smaller phrase technology. Any transcriptional errors that result from this process are unintentional.   Nicholes Mango M.D on 11/13/2018 at 2:24 PM  Between 7am to 6pm - Pager - [phone] After 6pm go to [email or web address] - password [email or web address] [facility]  [place] [facility]  Office  [phone]  CC: Primary care physician; [facility]

## 2018-11-13 NOTE — Op Note (Signed)
Date9/25/2020  Time  12:38 pm   PATIENT:  Timothy Pham   PRE-OPERATIVE DIAGNOSIS:  closed wedge compression fracture of L1 and L2   POST-OPERATIVE DIAGNOSIS:  closed wedge compression fracture of L1 and L2   PROCEDURE:  Procedure(s): KYPHOPLASTY L1 and L2  SURGEON: Laurene Footman, MD   ASSISTANTS: None   ANESTHESIA:   local and MAC   EBL:  No intake/output data recorded.   BLOOD ADMINISTERED:none   DRAINS: none    LOCAL MEDICATIONS USED:  MARCAINE    and XYLOCAINE    SPECIMEN:   L1 vertebral body biopsy   DISPOSITION OF SPECIMEN:  Pathology   COUNTS:  YES   TOURNIQUET:  * No tourniquets in log *   IMPLANTS: Bone cement   DICTATION: .Dragon Dictation  patient was brought to the operating room and after adequate general anesthesia was obtained the patient was placed prone.  C arm was brought in in good visualization of the affected level obtained on both AP and lateral projections.   The back was  prepped and draped in the usual sterile manner and  timeout procedure carried out.  A spinal needle was brought down to the pedicle on the both sides of  L1 and L2 and a 50-50 mix of 1% Xylocaine half percent Sensorcaine with epinephrine total of 15 cc injected.  After allowing this to set a small incision was made and the trocar was advanced into the vertebral body in an extrapedicular fashion on the right and left at both L1 and L2 Biopsy was obtained at L1 but not obtained at L2 despite attempt. Drilling was carried out balloon inserted with inflation to  2-1/2 cc on each side at each level.    When the cement was appropriate consistency approximately 6 cc were injected into the vertebral body without extravasation, good fill superior to inferior endplates and from right to left sides along the inferior endplate.  After the cement had set the trochars were removed and permanent C-arm views obtained.  The wounds were closed with Dermabond followed by Band-Aid   PLAN OF CARE:   Continue as inpatient   PATIENT DISPOSITION:  PACU - hemodynamically stable.

## 2018-11-13 NOTE — Anesthesia Procedure Notes (Signed)
Procedure Name: Intubation Date/Time: 11/13/2018 11:39 AM Performed by: Eben Burow, CRNA Pre-anesthesia Checklist: Patient identified, Emergency Drugs available, Suction available and Patient being monitored Patient Re-evaluated:Patient Re-evaluated prior to induction Oxygen Delivery Method: Circle system utilized Preoxygenation: Pre-oxygenation with 100% oxygen Induction Type: IV induction Ventilation: Mask ventilation without difficulty Laryngoscope Size: McGraph and 4 Grade View: Grade I Tube type: Oral Tube size: 7.5 mm Number of attempts: 1 Airway Equipment and Method: Stylet,  Oral airway and Video-laryngoscopy Placement Confirmation: positive ETCO2 and breath sounds checked- equal and bilateral Secured at: 21 cm Tube secured with: Tape Dental Injury: Teeth and Oropharynx as per pre-operative assessment

## 2018-11-13 NOTE — NC FL2 (Signed)
[place] MEDICAID FL2 LEVEL OF CARE SCREENING TOOL     IDENTIFICATION  Patient Name: Timothy Pham Birthdate: 04-22-1965 Sex: male Admission Date (Current Location): 11/10/2018  [place] and [place] Number:  Chiropodist and Address:  [facility], [address] [place], [place] [address]      Provider Number: [id number]  Attending Physician Name and Address:  Timothy Lab, MD  Relative Name and Phone Number:       Current Level of Care: Hospital Recommended Level of Care: Skilled Nursing Facility Prior Approval Number:    Date Approved/Denied:   PASRR Number:    Discharge Plan: SNF    Current Diagnoses: Patient Active Problem List   Diagnosis Date Noted  . Compression fracture of L2 (HCC) 11/10/2018  . Small bowel obstruction (HCC) 04/30/2018  . GERD (gastroesophageal reflux disease) 01/28/2018  . Hyperlipidemia 01/28/2018  . Carotid stenosis, symptomatic, with infarction (HCC) 01/28/2018  . Headache 01/20/2018    Orientation RESPIRATION BLADDER Height & Weight     Self, Time, Situation, Place  Normal Continent Weight: 152 lb (68.9 kg) Height:  5\' 5"  (165.1 cm)  BEHAVIORAL SYMPTOMS/MOOD NEUROLOGICAL BOWEL NUTRITION STATUS      Continent Diet(Diet: Regular)  AMBULATORY STATUS COMMUNICATION OF NEEDS Skin   Extensive Assist Verbally Surgical wounds                       Personal Care Assistance Level of Assistance  Bathing, Dressing, Feeding Bathing Assistance: Limited assistance Feeding assistance: Independent Dressing Assistance: Limited assistance     Functional Limitations Info  Sight, Hearing, Speech Sight Info: Adequate Hearing Info: Adequate Speech Info: Adequate    SPECIAL CARE FACTORS FREQUENCY  PT (By licensed PT), OT (By licensed OT)     PT Frequency: 5 OT Frequency: 5            Contractures      Additional Factors Info  Code Status, Allergies Code Status Info: Full Code. Allergies Info:  No known allergies.           Current Medications (11/13/2018):  This is the current hospital active medication list Current Facility-Administered Medications  Medication Dose Route Frequency Provider Last Rate Last Dose  . 0.9 %  sodium chloride infusion   Intravenous Continuous 11/15/2018, MD 100 mL/hr at 11/13/18 0901    . acetaminophen (TYLENOL) tablet 650 mg  650 mg Oral Q6H PRN 11/15/18, MD   650 mg at 11/12/18 1452   Or  . acetaminophen (TYLENOL) suppository 650 mg  650 mg Rectal Q6H PRN Mayo, 11/14/18, MD      . allopurinol (ZYLOPRIM) tablet 100 mg  100 mg Oral Daily Mayo, Allyn Kenner, MD   100 mg at 11/13/18 0850  . amLODipine (NORVASC) tablet 5 mg  5 mg Oral Daily Mayo, 11/15/18, MD   5 mg at 11/13/18 0851  . aspirin EC tablet 81 mg  81 mg Oral Daily Mayo, 11/15/18, MD   81 mg at 11/13/18 11/15/18  . atorvastatin (LIPITOR) tablet 40 mg  40 mg Oral q1800 Mayo, 5852, MD   40 mg at 11/12/18 1759  . buPROPion (WELLBUTRIN XL) 24 hr tablet 150 mg  150 mg Oral Daily Mayo, 11/14/18, MD   150 mg at 11/13/18 11/15/18  . ceFAZolin (ANCEF) IVPB 1 g/50 mL premix  1 g Intravenous Once 7782, MD      . Chlorhexidine Gluconate Cloth 2 %  PADS 6 each  6 each Topical Daily Gouru, Aruna, MD   6 each at 11/12/18 1000  . colchicine tablet 0.6 mg  0.6 mg Oral BID Hessie Knows, MD   0.6 mg at 11/13/18 3734  . feeding supplement (ENSURE ENLIVE) (ENSURE ENLIVE) liquid 237 mL  237 mL Oral BID BM Gouru, Aruna, MD   237 mL at 11/12/18 1457  . HYDROmorphone (DILAUDID) injection 1 mg  1 mg Intravenous Q4H PRN Lance Coon, MD   1 mg at 11/12/18 2876  . loratadine (CLARITIN) tablet 10 mg  10 mg Oral Daily PRN Mayo, Pete Pelt, MD      . ondansetron [facility]) tablet 4 mg  4 mg Oral Q6H PRN Mayo, Pete Pelt, MD       Or  . ondansetron [facility]) injection 4 mg  4 mg Intravenous Q6H PRN Mayo, Pete Pelt, MD      . oxyCODONE (Oxy IR/ROXICODONE) immediate release tablet 5 mg  5 mg Oral Q4H PRN Mayo, Pete Pelt, MD   5 mg at 11/13/18 0853  . pantoprazole (PROTONIX) EC tablet 40 mg  40 mg Oral Daily Mayo, Pete Pelt, MD   40 mg at 11/13/18 8115  . polyethylene glycol (MIRALAX / GLYCOLAX) packet 17 g  17 g Oral Daily PRN Mayo, Pete Pelt, MD         Discharge Medications: Please see discharge summary for a list of discharge medications.  Relevant Imaging Results:  Relevant Pham Results:   Additional Information SSN: [id number]  Timothy Pham, Timothy Pham, Timothy Pham

## 2018-11-13 NOTE — Anesthesia Postprocedure Evaluation (Signed)
Anesthesia Post Note  Patient: Timothy Pham  Procedure(s) Performed: KYPHOPLASTY L1, L2 (N/A )  Patient location during evaluation: PACU Anesthesia Type: General Level of consciousness: awake and alert and oriented Pain management: pain level controlled Vital Signs Assessment: post-procedure vital signs reviewed and stable Respiratory status: spontaneous breathing, nonlabored ventilation and respiratory function stable Cardiovascular status: blood pressure returned to baseline and stable Postop Assessment: no signs of nausea or vomiting Anesthetic complications: no     Last Vitals:  Vitals:   11/13/18 1408 11/13/18 1428  BP: 117/70 126/69  Pulse: 79 80  Resp: 16 17  Temp:  36.4 C  SpO2: 92% 94%    Last Pain:  Vitals:   11/13/18 1330  TempSrc:   PainSc: 0-No pain                 Montrice Gracey

## 2018-11-13 NOTE — Progress Notes (Signed)
Dr. Margaretmary Eddy and I in with pt this morning talking to him about his CXR. Pt states he will not do it b/c he refuses to sit forward in order for them to slip the back x-ray plate behind him.

## 2018-11-13 NOTE — Anesthesia Post-op Follow-up Note (Signed)
Anesthesia QCDR form completed.        

## 2018-11-13 NOTE — Progress Notes (Signed)
Dr Randa Lynn called   Oxygen level 88 to 91   Lungs clear  Poor inspiratory  effort   Dr Randa Lynn states to just increase oxygen level

## 2018-11-13 NOTE — Progress Notes (Signed)
PT Cancellation Note  Patient Details Name: Timothy Pham MRN: [id number] DOB: Jan 17, 1966   Cancelled Treatment:    Reason Eval/Treat Not Completed: Other (comment)(Patient emotionally distressed when PT entered room, discussed job termination and pt feelings. Pt provided active listening and offered chaplain services, pt declined. Pt also verbalized "I just don't want to live any more". RN notified.)  Lieutenant Diego PT, DPT 3:56 PM,11/13/18 [phone]

## 2018-11-13 NOTE — Anesthesia Preprocedure Evaluation (Signed)
Anesthesia Evaluation  Patient identified by MRN, date of birth, ID band Patient awake    Reviewed: Allergy & Precautions, NPO status , Patient's Chart, lab work & pertinent test results  History of Anesthesia Complications Negative for: history of anesthetic complications  Airway Mallampati: III  TM Distance: >3 FB Neck ROM: Full    Dental  (+) Poor Dentition   Pulmonary neg sleep apnea, neg COPD, Current Smoker and Patient abstained from smoking.,    breath sounds clear to auscultation- rhonchi (-) wheezing      Cardiovascular hypertension, Pt. on medications (-) CAD, (-) Past MI, (-) Cardiac Stents and (-) CABG  Rhythm:Regular Rate:Normal - Systolic murmurs and - Diastolic murmurs    Neuro/Psych  Headaches, PSYCHIATRIC DISORDERS Anxiety Bipolar Disorder CVA, No Residual Symptoms    GI/Hepatic Neg liver ROS, GERD  ,  Endo/Other  negative endocrine ROSneg diabetes  Renal/GU Renal disease: hx of nephrolithiasis.     Musculoskeletal negative musculoskeletal ROS (+)   Abdominal (+) - obese,   Peds  Hematology negative hematology ROS (+)   Anesthesia Other Findings Past Medical History: No date: Anxiety No date: Bipolar 1 disorder (HCC) No date: GERD (gastroesophageal reflux disease) No date: Hyperlipidemia No date: Hypertension No date: Nephrolithiasis No date: Stroke [facility])     Comment:  December 2019   Reproductive/Obstetrics                             Anesthesia Physical Anesthesia Plan  ASA: III  Anesthesia Plan: General   Post-op Pain Management:    Induction: Intravenous  PONV Risk Score and Plan: 0 and Ondansetron  Airway Management Planned: Oral ETT  Additional Equipment:   Intra-op Plan:   Post-operative Plan: Extubation in OR  Informed Consent: I have reviewed the patients History and Physical, chart, labs and discussed the procedure including the risks,  benefits and alternatives for the proposed anesthesia with the patient or authorized representative who has indicated his/her understanding and acceptance.     Dental advisory given  Plan Discussed with: CRNA and Anesthesiologist  Anesthesia Plan Comments:         Anesthesia Quick Evaluation

## 2018-11-13 NOTE — Transfer of Care (Signed)
Immediate Anesthesia Transfer of Care Note  Patient: Timothy Pham  Procedure(s) Performed: KYPHOPLASTY L1, L2 (N/A )  Patient Location: PACU  Anesthesia Type:General  Level of Consciousness: sedated  Airway & Oxygen Therapy: Patient Spontanous Breathing and Patient connected to face mask oxygen  Post-op Assessment: Report given to RN and Post -op Vital signs reviewed and stable  Post vital signs: Reviewed and stable  Last Vitals:  Vitals Value Taken Time  BP 132/60 11/13/18 1244  Temp 36.6 C 11/13/18 1244  Pulse 80 11/13/18 1250  Resp 13 11/13/18 1250  SpO2 100 % 11/13/18 1250  Vitals shown include unvalidated device data.  Last Pain:  Vitals:   11/13/18 1244  TempSrc:   PainSc: Asleep      Patients Stated Pain Goal: 2 (44/31/54 0086)  Complications: No apparent anesthesia complications

## 2018-11-13 NOTE — TOC Progression Note (Signed)
Transition of Care [facility]) - Progression Note    Patient Details  Name: Timothy Pham MRN: [id number] Date of Birth: 08/17/1965  Transition of Care [facility]) CM/SW Contact  Sacred Roa, Lenice Llamas Phone Number: [phone]  11/13/2018, 5:00 PM  Clinical Narrative: Clinical Social Worker (CSW) met with patient after surgery today. Patient reported that he will go home and requested a rolling walker and bedside commode. Per patient he doesn't have insurance and he can't afford the equipment. CSW provided patient with a donated rolling walker. [employer] agency representative aware that bedside commode was also delivered. Patient reported that he would also like home health. CSW explained that charity home health can be arranged. [facility] is the home health agency on today for charity care however [place] representative said they can't accept patient. Per [place] she will contact her supervisor and get back to [place]. [facility] also declined patient. CSW will continue to follow and assist as needed.     Expected Discharge Plan: [place] Barriers to Discharge: Continued Medical Work up, Inadequate or no insurance  Expected Discharge Plan and Services Expected Discharge Plan: [place] In-house Referral: Clinical Social Work     Living arrangements for the past 2 months: Mobile Home                                       Social Determinants of Health (SDOH) Interventions    Readmission Risk Interventions No flowsheet data found.

## 2018-11-14 DIAGNOSIS — F319 Bipolar disorder, unspecified: Secondary | ICD-10-CM

## 2018-11-14 DIAGNOSIS — F330 Major depressive disorder, recurrent, mild: Secondary | ICD-10-CM

## 2018-11-14 DIAGNOSIS — F4321 Adjustment disorder with depressed mood: Secondary | ICD-10-CM

## 2018-11-14 DIAGNOSIS — F32A Depression, unspecified: Secondary | ICD-10-CM | POA: Diagnosis present

## 2018-11-14 DIAGNOSIS — F329 Major depressive disorder, single episode, unspecified: Secondary | ICD-10-CM | POA: Diagnosis present

## 2018-11-14 LAB — BASIC METABOLIC PANEL
Anion gap: 14 (ref 5–15)
BUN: 17 mg/dL (ref 6–20)
CO2: 22 mmol/L (ref 22–32)
Calcium: 9.2 mg/dL (ref 8.9–10.3)
Chloride: 104 mmol/L (ref 98–111)
Creatinine, Ser: 0.78 mg/dL (ref 0.61–1.24)
GFR calc Af Amer: >60 mL/min (ref >60)
GFR calc non Af Amer: >60 mL/min (ref >60)
Glucose, Bld: 163 mg/dL — ABNORMAL HIGH (ref 70–99)
Potassium: 4.1 mmol/L (ref 3.5–5.1)
Sodium: 140 mmol/L (ref 135–145)

## 2018-11-14 LAB — CBC WITH DIFFERENTIAL/PLATELET
Abs Immature Granulocytes: 0.06 10*3/uL (ref 0.00–0.07)
Basophils Absolute: 0 10*3/uL (ref 0.0–0.1)
Basophils Relative: 0 %
Eosinophils Absolute: 0 10*3/uL (ref 0.0–0.5)
Eosinophils Relative: 0 %
HCT: 37.7 % — ABNORMAL LOW (ref 39.0–52.0)
Hemoglobin: 12.5 g/dL — ABNORMAL LOW (ref 13.0–17.0)
Immature Granulocytes: 1 %
Lymphocytes Relative: 6 %
Lymphs Abs: 0.7 10*3/uL (ref 0.7–4.0)
MCH: 30.3 pg (ref 26.0–34.0)
MCHC: 33.2 g/dL (ref 30.0–36.0)
MCV: 91.3 fL (ref 80.0–100.0)
Monocytes Absolute: 1 10*3/uL (ref 0.1–1.0)
Monocytes Relative: 8 %
Neutro Abs: 10.4 10*3/uL — ABNORMAL HIGH (ref 1.7–7.7)
Neutrophils Relative %: 85 %
Platelets: 357 10*3/uL (ref 150–400)
RBC: 4.13 MIL/uL — ABNORMAL LOW (ref 4.22–5.81)
RDW: 12.8 % (ref 11.5–15.5)
WBC: 12.1 10*3/uL — ABNORMAL HIGH (ref 4.0–10.5)
nRBC: 0 % (ref 0.0–0.2)

## 2018-11-14 LAB — PROCALCITONIN: Procalcitonin: <0.1 ng/mL

## 2018-11-14 MED ORDER — METHOCARBAMOL 500 MG PO TABS: 500.0000 mg | ORAL_TABLET | Freq: Four times a day (QID) | ORAL | 0 refills | Status: DC | PRN

## 2018-11-14 MED ORDER — ALLOPURINOL 100 MG PO TABS: 100.0000 mg | ORAL_TABLET | Freq: Every day | ORAL | 0 refills | Status: AC

## 2018-11-14 MED ORDER — ENSURE ENLIVE PO LIQD: 237.0000 mL | Freq: Two times a day (BID) | ORAL | 12 refills | Status: DC

## 2018-11-14 MED ORDER — DOCUSATE SODIUM 100 MG PO CAPS: 100.0000 mg | ORAL_CAPSULE | Freq: Every day | ORAL | 0 refills | Status: AC

## 2018-11-14 MED ORDER — IBUPROFEN 600 MG PO TABS: 600.0000 mg | ORAL_TABLET | Freq: Three times a day (TID) | ORAL | 0 refills | Status: DC | PRN

## 2018-11-14 MED ORDER — METHOCARBAMOL 500 MG PO TABS: 500.0000 mg | ORAL_TABLET | Freq: Four times a day (QID) | ORAL | 0 refills | Status: AC | PRN

## 2018-11-14 MED ORDER — ENSURE ENLIVE PO LIQD: 237.0000 mL | Freq: Two times a day (BID) | ORAL | 12 refills | Status: AC

## 2018-11-14 MED ORDER — ACETAMINOPHEN 325 MG PO TABS: 650.0000 mg | ORAL_TABLET | Freq: Four times a day (QID) | ORAL | Status: DC | PRN

## 2018-11-14 MED ORDER — AMLODIPINE BESYLATE 5 MG PO TABS: 5.0000 mg | ORAL_TABLET | Freq: Every day | ORAL | 0 refills | Status: AC

## 2018-11-14 MED ORDER — COLCHICINE 0.6 MG PO TABS: 0.6000 mg | ORAL_TABLET | Freq: Every day | ORAL | 0 refills | Status: AC

## 2018-11-14 MED ORDER — COLCHICINE 0.6 MG PO TABS: 0.6000 mg | ORAL_TABLET | Freq: Every day | ORAL | 0 refills | Status: DC

## 2018-11-14 NOTE — Evaluation (Signed)
Physical Therapy Evaluation Patient Details Name: Timothy Pham MRN: [id number] DOB: 09/05/1965 Today's Date: 11/14/2018   History of Present Illness  Pt is a 53 y.o. male presenting to hospital 11/10/18 s/p fall.  Imaging (+) for acute superior endplate compression fx L2 and L1 compression deformity; also remote severe T12 compression fx.  Pt s/p kyphoplasty L1 and L2 11/13/18.  Of note, pt in ED 9/19 for L knee pain (pt felt "pop" putting on shoe--pt given knee immobilizer and crutches in ED with follow up orthopedic recommendations).  MRI L knee 9/24 showing nondisplaced under surface tear of posterior medial meniscus.  PMH includes anxiety, biopolar 1, htn, h/o stroke Dec 2019, and htn.  Clinical Impression  Prior to hospital admission, pt was using axillary crutches and L knee immobilizer d/t recent L knee injury (prior to that pt was independent).  Pt lives alone in 1 level home with 6 steps and B railings to enter.  Currently pt is independent with bed mobility; CGA progressing to SBA with transfers; CGA progressing to SBA ambulating around nursing loop with RW; and CGA progressing to SBA navigating 8 steps with B railings.  Pt reporting minimal soreness in low back and L knee during session; pt also reporting mild R ankle soreness d/t "gout".  Overall pt steady without any loss of balance during session's activities (including toileting activities).  Pt reports he will have assist available at home as needed and pt was able to verbalize appropriate precautions for injuries and to prevent falls.  Pt would benefit from skilled PT to address noted impairments and functional limitations (see below for any additional details).  Upon hospital discharge, recommend pt discharge with HHPT.    Follow Up Recommendations Home health PT    Equipment Recommendations  Rolling walker with 5" wheels;3in1 (PT)    Recommendations for Other Services       Precautions / Restrictions Precautions Precautions:  Fall;Back Precaution Comments: Avoid any axial overloading exercises Required Braces or Orthoses: Knee Immobilizer - Left(as needed) Restrictions Weight Bearing Restrictions: Yes LLE Weight Bearing: Weight bearing as tolerated Other Position/Activity Restrictions: Per secure text with MD Rosita Kea 11/14/18:  no back brace needed; WBAT L LE; L knee immobilizer as needed      Mobility  Bed Mobility Overal bed mobility: Independent             General bed mobility comments: Supine to sit without any noted difficulties  Transfers Overall transfer level: Needs assistance Equipment used: Rolling walker (2 wheeled) Transfers: Sit to/from Stand Sit to Stand: Min guard;Supervision         General transfer comment: x2 trials from recliner; x2 trials from mat table; x1 trial from [facility] over toilet; initial vc's for technique (UE/LE positioning) and then no further cueing required  Ambulation/Gait Ambulation/Gait assistance: Min guard;Supervision Gait Distance (Feet): (120 feet; 200 feet) Assistive device: Rolling walker (2 wheeled)   Gait velocity: mildly decreased   General Gait Details: mild decreased stance time L LE with L KI donned; partial step through gait pattern; occasional vc's to stay closer to [employer] Stairs: Yes Stairs assistance: Min guard;Supervision Stair Management: Two rails;Step to pattern;Forwards Number of Stairs: 8 General stair comments: initial visual demo and vc's and then no further cueing required; steady on stairs  Wheelchair Mobility    Modified Rankin (Stroke Patients Only)       Balance Overall balance assessment: Needs assistance Sitting-balance support: No upper extremity supported;Feet supported Sitting balance-Leahy Scale: Normal Sitting balance -  Comments: steady sitting reaching outside BOS   Standing balance support: No upper extremity supported Standing balance-Leahy Scale: Good Standing balance comment: steady standing performing  hygiene post toileting and washing hands at sink                             Pertinent Vitals/Pain Pain Assessment: 0-10 Pain Score: 2  Pain Location: low back soreness Pain Descriptors / Indicators: Sore Pain Intervention(s): Limited activity within patient's tolerance;Monitored during session;Repositioned  Vitals (HR and O2 on room air) stable and WFL throughout treatment session.    Home Living Family/patient expects to be discharged to:: Private residence Living Arrangements: Alone Available Help at Discharge: Family;Friend(s) Type of Home: Mobile home Home Access: Stairs to enter Entrance Stairs-Rails: Right;Left;Can reach both Entrance Stairs-Number of Steps: 6 Home Layout: One level Home Equipment: Crutches      Prior Function Level of Independence: Independent         Comments: Recently started new job.  (+) driving.  Has brother and friends that can assist as needed.     Hand Dominance   Dominant Hand: Right    Extremity/Trunk Assessment   Upper Extremity Assessment Upper Extremity Assessment: Overall WFL for tasks assessed    Lower Extremity Assessment Lower Extremity Assessment: RLE deficits/detail;LLE deficits/detail RLE Deficits / Details: strength and ROM WFL (deferred MMT to R ankle d/t reports of soreness from gout) LLE Deficits / Details: at least 3/5 hip flexion, knee flexion (to grossly 80 degrees per pt tolerance)/knee extension, and DF/PF LLE: Unable to fully assess due to pain(L knee soreness)    Cervical / Trunk Assessment Cervical / Trunk Assessment: Normal  Communication   Communication: No difficulties  Cognition Arousal/Alertness: Awake/alert Behavior During Therapy: WFL for tasks assessed/performed Overall Cognitive Status: Within Functional Limits for tasks assessed                                        General Comments   Nursing cleared pt for participation in physical therapy.  Pt agreeable to PT  session and requesting to wear L knee immobilizer entire session (L KI donned in bed beginning of session).    Exercises  Transfer and gait/stair training   Assessment/Plan    PT Assessment Patient needs continued PT services  PT Problem List Decreased strength;Decreased range of motion;Decreased activity tolerance;Decreased balance;Decreased mobility;Decreased knowledge of use of DME;Pain       PT Treatment Interventions DME instruction;Gait training;Stair training;Functional mobility training;Therapeutic activities;Therapeutic exercise;Balance training;Patient/family education    PT Goals (Current goals can be found in the Care Plan section)  Acute Rehab PT Goals Patient Stated Goal: to go home PT Goal Formulation: With patient Time For Goal Achievement: 11/28/18 Potential to Achieve Goals: Good    Frequency 7X/week   Barriers to discharge        Co-evaluation               AM-PAC PT "6 Clicks" Mobility  Outcome Measure Help needed turning from your back to your side while in a flat bed without using bedrails?: None Help needed moving from lying on your back to sitting on the side of a flat bed without using bedrails?: None Help needed moving to and from a bed to a chair (including a wheelchair)?: A Little Help needed standing up from a chair using your arms (e.g.,  wheelchair or bedside chair)?: A Little Help needed to walk in hospital room?: A Little Help needed climbing 3-5 steps with a railing? : A Little 6 Click Score: 20    End of Session Equipment Utilized During Treatment: Gait belt Activity Tolerance: Patient tolerated treatment well Patient left: in chair;with call bell/phone within reach;with chair alarm set;Other (comment)(B heels floating via pillow) Nurse Communication: Mobility status;Precautions;Weight bearing status PT Visit Diagnosis: Other abnormalities of gait and mobility (R26.89);Muscle weakness (generalized) (M62.81);History of falling  (Z91.81);Difficulty in walking, not elsewhere classified (R26.2);Pain Pain - Right/Left: Left Pain - part of body: Knee    Time: 0932-1032 PT Time Calculation (min) (ACUTE ONLY): 60 min   Charges:   PT Evaluation $PT Eval Low Complexity: 1 Low PT Treatments $Gait Training: 23-37 mins $Therapeutic Activity: 8-22 mins       Hendricks LimesEmily Frederich Montilla, PT 11/14/18, 11:41 AM [phone]

## 2018-11-14 NOTE — Progress Notes (Signed)
DISCHARGE NOTE:  Pt given discharge instructions and scripts. Pt verbalized understanding. Pt wheeled to car by staff. Walker and [facility] sent with pt. Brother providing transportation.

## 2018-11-14 NOTE — Discharge Summary (Signed)
[facility] [place] at [facility]   PATIENT NAME: Timothy Pham    MR#:  [id number]  DATE OF BIRTH:  06/28/1965  DATE OF ADMISSION:  11/10/2018 ADMITTING PHYSICIAN: Campbell StallKaty Dodd Mayo, MD  DATE OF DISCHARGE:  11/14/18   PRIMARY CARE PHYSICIAN: [place], [facility]    ADMISSION DIAGNOSIS:  Compression fracture of L2 lumbar vertebra, closed, initial encounter (HCC) [S32.020A]  DISCHARGE DIAGNOSIS:  Active Problems:   Compression fracture of L2 (HCC)   SECONDARY DIAGNOSIS:   Past Medical History:  Diagnosis Date  . Anxiety   . Bipolar 1 disorder (HCC)   . GERD (gastroesophageal reflux disease)   . Hyperlipidemia   . Hypertension   . Nephrolithiasis   . Stroke [facility](HCC)    December 2019    HOSPITAL COURSE:   Timothy Pham  is a 53 y.o. male with a known history of hypertension, hyperlipidemia, history of stroke, bipolar 1 disorder, anxiety who presented to the ED with lower back pain.  Patient states that last Friday, he was putting his shoes on when he heard something pop in his left knee.  He then went to work, where he walked around a lot.  When he got home, he noticed that his left knee was swollen.  He twisted his knee for a second time while walking up his stairs.  The ED on 9/18 for left knee pain.  He was prescribed some tramadol and was placed in a knee immobilizer.  Knee x-ray in the ED showed a moderate knee effusion. He was advised to follow-up with orthopedic surgery as an outpatient.  He was told that he would have an MRI for further evaluation.  This is supposed to be scheduled this week.  Yesterday, patient was trying to get some ice from the refrigerator to apply to his knee, when he suddenly fell backwards onto the floor of his kitchen.  He he denies any syncope or loss of consciousness.  He did hit his head on the floor.  He laid on the ground for about 1 hour, before his brother found him.  He had instant low back pain.  The back pain did not  radiate.  He denies any numbness or tingling of his legs.  No bowel or bladder incontinence.  In the ED, vitals were unremarkable.  Labs were significant for WBC 10.8.  CT lumbar spine showed an acute superior endplate compression fracture of L2 with vertebral body height loss of approximately 30%, as well as a remote severe T12 compression fracture.  Hospitalists were called for admission  Acute L2 compression fracture with 30% weight loss, L1 compression fracture and chronic T12 compression fracture s/p mechanical fall -Orthopedic surgery consult- discussed with Dr. Jasmine PangMenz-scheduled for kyphoplasty on Thursday (last took plavix 9/21 at 0800) -Pain control as needed -Patient has a back brace in place -PT consult after kyphoplasty -MRI of the lumbar spine done results noted -His procedure is canceled 9 [id number] as patient was febrile with temperature 102.2, but afebrile afterwards -L1-L2 kyphoplasty done and biopsies were taken by Dr. Rosita KeaMenz sent for pathology.  Okay to discharge patient from orthopedic standpoint and outpatient follow-up with orthopedics in 2 weeks -Home health PT   Fever-could be from atelectasis patient is not taking deep breaths from back pain No leukocytosis, not hypotensive no tachycardia no tachypnea Peak acid and procalcitonin are normal and no leukocytosis on 24th and 25 September Blood cultures were negative, urine cultures pending,pcp to follow pending urine cx  and patient refused to get chest x-ray Incentive spirometry Not considering antibiotics at this time   Left knee injury-likely ligamentous tear. -Left knee x-rays with a moderate knee effusion -MRI of the left knee has revealed nondisplaced tear of the posterior medial meniscus -Orthopedic is recommending outpatient follow-up in 2 weeks okay to discharge from Ortho standpoint -Tylenol as needed for mild pain and Advil with food for moderate to severe pain Muscle relaxants as needed -Patient refused  narcotic pain medicines  Leukocytosis-likely a stress reaction.  Resolved  History of stroke-no residual deficits -Continue aspirin -Hold Plavix until evaluated by orthopedic surgery  Hypertension-BP normal in the ED -Continue home Norvasc  Hyperlipidemia-stable -Continue home Lipitor  History of gout-no acute flare -Continue home allopurinol  Bipolar 1 disorder-stable -Continue home Wellbutrin  DVT prophylaxis- SCDs until evaluated by surgery  Work excuse letter given until October 3 and patient can resume work with no limitations from October 3 DISCHARGE CONDITIONS:   Stable   CONSULTS OBTAINED:  Treatment Team:  Kennedy Bucker, MD Clement Sayres, MD   PROCEDURES kyphoplasty  DRUG ALLERGIES:  No Active Allergies  DISCHARGE MEDICATIONS:   Allergies as of 11/14/2018   No Active Allergies     Medication List    STOP taking these medications   traMADol 50 MG tablet Commonly known as: Ultram     TAKE these medications   acetaminophen 325 MG tablet Commonly known as: TYLENOL Take 2 tablets (650 mg total) by mouth every 6 (six) hours as needed for mild pain (or Fever >/= 101).   allopurinol 100 MG tablet Commonly known as: ZYLOPRIM Take 1 tablet (100 mg total) by mouth daily.   amLODipine 5 MG tablet Commonly known as: NORVASC Take 5 mg by mouth daily.   aspirin EC 81 MG tablet Take 81 mg by mouth daily.   atorvastatin 40 MG tablet Commonly known as: LIPITOR Take 1 tablet (40 mg total) by mouth daily at 6 PM.   buPROPion 150 MG 24 hr tablet Commonly known as: WELLBUTRIN XL Take 150 mg by mouth daily.   clopidogrel 75 MG tablet Commonly known as: PLAVIX Take 1 tablet (75 mg total) by mouth daily.   colchicine 0.6 MG tablet Take 1 tablet (0.6 mg total) by mouth daily.   docusate sodium 100 MG capsule Commonly known as: COLACE Take 1 capsule (100 mg total) by mouth daily.   feeding supplement (ENSURE ENLIVE) Liqd Take 237  mLs by mouth 2 (two) times daily between meals.   ibuprofen 600 MG tablet Commonly known as: ADVIL Take 1 tablet (600 mg total) by mouth every 8 (eight) hours as needed for moderate pain or cramping. Take with food   loratadine 10 MG tablet Commonly known as: CLARITIN Take 10 mg by mouth daily.   methocarbamol 500 MG tablet Commonly known as: ROBAXIN Take 1 tablet (500 mg total) by mouth every 6 (six) hours as needed for muscle spasms.   omeprazole 20 MG capsule Commonly known as: PRILOSEC Take 20 mg by mouth 2 (two) times daily.            Durable Medical Equipment  (From admission, onward)         Start     Ordered   11/13/18 1530  For home use only DME Bedside commode  Once    Question:  Patient needs a bedside commode to treat with the following condition  Answer:  Weakness   11/13/18 1529   11/13/18 1529  For  home use only DME Walker rolling  Once    Question:  Patient needs a walker to treat with the following condition  Answer:  Acute low back pain   11/13/18 1529           DISCHARGE INSTRUCTIONS:   F/U WITH PCP in 3 days  F/u with ortho in 2 weeks  Home health   DIET:  Cardiac diet  DISCHARGE CONDITION:  Fair  ACTIVITY:  Activity as tolerated  OXYGEN:  Home Oxygen: No.   Oxygen Delivery: room air  DISCHARGE LOCATION:  home   If you experience worsening of your admission symptoms, develop shortness of breath, life threatening emergency, suicidal or homicidal thoughts you must seek medical attention immediately by calling 911 or calling your MD immediately  if symptoms less severe.  You Must read complete instructions/literature along with all the possible adverse reactions/side effects for all the Medicines you take and that have been prescribed to you. Take any new Medicines after you have completely understood and accpet all the possible adverse reactions/side effects.   Please note  You were cared for by a hospitalist during your  hospital stay. If you have any questions about your discharge medications or the care you received while you were in the hospital after you are discharged, you can call the unit and asked to speak with the hospitalist on call if the hospitalist that took care of you is not available. Once you are discharged, your primary care physician will handle any further medical issues. Please note that NO REFILLS for any discharge medications will be authorized once you are discharged, as it is imperative that you return to your primary care physician (or establish a relationship with a primary care physician if you do not have one) for your aftercare needs so that they can reassess your need for medications and monitor your lab values.     Today  Chief Complaint  Patient presents with  . Fall  . Back Pain   Back pain improved significantly , not feeling down/depressed  anymore like prior to kyphoplasty , as his back pain was intense prior to the procedure , now he feels like a new man and really wants to go home   ROS:  CONSTITUTIONAL: Denies fevers, chills. Denies any fatigue, weakness.  EYES: Denies blurry vision, double vision, eye pain. EARS, NOSE, THROAT: Denies tinnitus, ear pain, hearing loss. RESPIRATORY: Denies cough, wheeze, shortness of breath.  CARDIOVASCULAR: Denies chest pain, palpitations, edema.  GASTROINTESTINAL: Denies nausea, vomiting, diarrhea, abdominal pain. Denies bright red blood per rectum. GENITOURINARY: Denies dysuria, hematuria. ENDOCRINE: Denies nocturia or thyroid problems. HEMATOLOGIC AND LYMPHATIC: Denies easy bruising or bleeding. SKIN: Denies rash or lesion. MUSCULOSKELETAL: Denies pain in neck, back, shoulder,  hips or arthritic symptoms. Has knee pain but knee brace is helping  NEUROLOGIC: Denies paralysis, paresthesias.  PSYCHIATRIC: Denies anxiety or depressive symptoms.   VITAL SIGNS:  Blood pressure 139/75, pulse 63, temperature 97.7 F (36.5 C),  temperature source Oral, resp. rate 15, height 5\' 5"  (1.651 m), weight 68.9 kg, SpO2 92 %.  I/O:    Intake/Output Summary (Last 24 hours) at 11/14/2018 0916 Last data filed at 11/14/2018 0620 Gross per 24 hour  Intake 2217.26 ml  Output 1350 ml  Net 867.26 ml    PHYSICAL EXAMINATION:  GENERAL:  53 y.o.-year-old patient lying in the bed with no acute distress.  EYES: Pupils equal, round, reactive to light and accommodation. No scleral icterus. Extraocular muscles intact.  HEENT: Head atraumatic, normocephalic. Oropharynx and nasopharynx clear.  NECK:  Supple, no jugular venous distention. No thyroid enlargement, no tenderness.  LUNGS: Normal breath sounds bilaterally, no wheezing, rales,rhonchi or crepitation. No use of accessory muscles of respiration.  CARDIOVASCULAR: S1, S2 normal. No murmurs, rubs, or gallops.  ABDOMEN: Soft, non-tender, non-distended. Bowel sounds present.  EXTREMITIES: No pedal edema, cyanosis, or clubbing.  NEUROLOGIC: Cranial nerves II through XII are intact. Muscle strength 5/5 in all extremities except lt knee Sensation intact. Gait not checked.  PSYCHIATRIC: The patient is alert and oriented x 3.  SKIN: No obvious rash, lesion, or ulcer.   DATA REVIEW:   CBC Recent Labs  Lab 11/14/18 0416  WBC 12.1*  HGB 12.5*  HCT 37.7*  PLT 357    Chemistries  Recent Labs  Lab 11/14/18 0416  NA 140  K 4.1  CL 104  CO2 22  GLUCOSE 163*  BUN 17  CREATININE 0.78  CALCIUM 9.2    Cardiac Enzymes No results for input(s): TROPONINI in the last 168 hours.  Microbiology Results  Results for orders placed or performed during the hospital encounter of 11/10/18  SARS Coronavirus 2 [facility] order, Performed in [facility] hospital lab) Nasopharyngeal Nasopharyngeal Swab     Status: None   Collection Time: 11/10/18  2:27 PM   Specimen: Nasopharyngeal Swab  Result Value Ref Range Status   SARS Coronavirus 2 NEGATIVE NEGATIVE Final    Comment: (NOTE) If  result is NEGATIVE SARS-CoV-2 target nucleic acids are NOT DETECTED. The SARS-CoV-2 RNA is generally detectable in upper and lower  respiratory specimens during the acute phase of infection. The lowest  concentration of SARS-CoV-2 viral copies this assay can detect is 250  copies / mL. A negative result does not preclude SARS-CoV-2 infection  and should not be used as the sole basis for treatment or other  patient management decisions.  A negative result may occur with  improper specimen collection / handling, submission of specimen other  than nasopharyngeal swab, presence of viral mutation(s) within the  areas targeted by this assay, and inadequate number of viral copies  (<250 copies / mL). A negative result must be combined with clinical  observations, patient history, and epidemiological information. If result is POSITIVE SARS-CoV-2 target nucleic acids are DETECTED. The SARS-CoV-2 RNA is generally detectable in upper and lower  respiratory specimens dur ing the acute phase of infection.  Positive  results are indicative of active infection with SARS-CoV-2.  Clinical  correlation with patient history and other diagnostic information is  necessary to determine patient infection status.  Positive results do  not rule out bacterial infection or co-infection with other viruses. If result is PRESUMPTIVE POSTIVE SARS-CoV-2 nucleic acids MAY BE PRESENT.   A presumptive positive result was obtained on the submitted specimen  and confirmed on repeat testing.  While 2019 novel coronavirus  (SARS-CoV-2) nucleic acids may be present in the submitted sample  additional confirmatory testing may be necessary for epidemiological  and / or clinical management purposes  to differentiate between  SARS-CoV-2 and other Sarbecovirus currently known to infect humans.  If clinically indicated additional testing with an alternate test  methodology [phone]) is advised. The SARS-CoV-2 RNA is generally   detectable in upper and lower respiratory sp ecimens during the acute  phase of infection. The expected result is Negative. Fact Sheet for Patients:  BoilerBrush.com.cy Fact Sheet for Healthcare Providers: https://pope.com/ This test is not yet approved or cleared by the [place]  FDA and has been authorized for detection and/or diagnosis of SARS-CoV-2 by FDA under an Emergency Use Authorization (EUA).  This EUA will remain in effect (meaning this test can be used) for the duration of the COVID-19 declaration under Section 564(b)(1) of the Act, 21 U.S.C. section 360bbb-3(b)(1), unless the authorization is terminated or revoked sooner. Performed at [facility], [address], [place], [place] [address]   Surgical PCR screen     Status: None   Collection Time: 11/10/18 10:25 PM   Specimen: Nasal Mucosa; Nasal Swab  Result Value Ref Range Status   MRSA, PCR NEGATIVE NEGATIVE Final   Staphylococcus aureus NEGATIVE NEGATIVE Final    Comment: (NOTE) The Xpert SA Assay (FDA approved for NASAL specimens in patients 46 years of age and older), is one component of a comprehensive surveillance program. It is not intended to diagnose infection nor to guide or monitor treatment. Performed at [facility], [address] [place], [place] [address]   Urine Culture     Status: None (Preliminary result)   Collection Time: 11/12/18  3:27 PM   Specimen: Urine, Clean Catch  Result Value Ref Range Status   Specimen Description   Final    URINE, CLEAN CATCH Performed at [facility], [address] [place], [place] [address]    Special Requests   Final    Normal Performed at [facility], [address] [place], [place] [address]    Culture   Final    CULTURE REINCUBATED FOR BETTER GROWTH Performed at [facility], [address], [place], [place] [address]    Report Status PENDING   Incomplete  CULTURE, BLOOD (ROUTINE X 2) w Reflex to ID Panel     Status: None (Preliminary result)   Collection Time: 11/12/18  4:14 PM   Specimen: BLOOD  Result Value Ref Range Status   Specimen Description BLOOD BLOOD LEFT HAND  Final   Special Requests   Final    BOTTLES DRAWN AEROBIC AND ANAEROBIC Blood Culture adequate volume   Culture   Final    NO GROWTH 2 DAYS Performed at [facility], [address] [place], [place] [address]    Report Status PENDING  Incomplete  CULTURE, BLOOD (ROUTINE X 2) w Reflex to ID Panel     Status: None (Preliminary result)   Collection Time: 11/12/18  4:26 PM   Specimen: BLOOD  Result Value Ref Range Status   Specimen Description BLOOD BLOOD RIGHT HAND  Final   Special Requests   Final    BOTTLES DRAWN AEROBIC AND ANAEROBIC Blood Culture adequate volume   Culture   Final    NO GROWTH 2 DAYS Performed at [facility], [address] [place], [place] [address]    Report Status PENDING  Incomplete  SARS Coronavirus 2 [facility] order, Performed in [facility] hospital lab) Nasopharyngeal     Status: None   Collection Time: 11/12/18  4:50 PM   Specimen: Nasopharyngeal  Result Value Ref Range Status   SARS Coronavirus 2 NEGATIVE NEGATIVE Final    Comment: (NOTE) If result is NEGATIVE SARS-CoV-2 target nucleic acids are NOT DETECTED. The SARS-CoV-2 RNA is generally detectable in upper and lower  respiratory specimens during the acute phase of infection. The lowest  concentration of SARS-CoV-2 viral copies this assay can detect is 250  copies / mL. A negative result does not preclude SARS-CoV-2 infection  and should not be used as the sole  basis for treatment or other  patient management decisions.  A negative result may occur with  improper specimen collection / handling, submission of specimen other  than nasopharyngeal swab, presence of viral mutation(s) within the  areas targeted by this assay, and inadequate number of  viral copies  (<250 copies / mL). A negative result must be combined with clinical  observations, patient history, and epidemiological information. If result is POSITIVE SARS-CoV-2 target nucleic acids are DETECTED. The SARS-CoV-2 RNA is generally detectable in upper and lower  respiratory specimens dur ing the acute phase of infection.  Positive  results are indicative of active infection with SARS-CoV-2.  Clinical  correlation with patient history and other diagnostic information is  necessary to determine patient infection status.  Positive results do  not rule out bacterial infection or co-infection with other viruses. If result is PRESUMPTIVE POSTIVE SARS-CoV-2 nucleic acids MAY BE PRESENT.   A presumptive positive result was obtained on the submitted specimen  and confirmed on repeat testing.  While 2019 novel coronavirus  (SARS-CoV-2) nucleic acids may be present in the submitted sample  additional confirmatory testing may be necessary for epidemiological  and / or clinical management purposes  to differentiate between  SARS-CoV-2 and other Sarbecovirus currently known to infect humans.  If clinically indicated additional testing with an alternate test  methodology [phone]) is advised. The SARS-CoV-2 RNA is generally  detectable in upper and lower respiratory sp ecimens during the acute  phase of infection. The expected result is Negative. Fact Sheet for Patients:  BoilerBrush.com.cy Fact Sheet for Healthcare Providers: https://pope.com/ This test is not yet approved or cleared by the [place] FDA and has been authorized for detection and/or diagnosis of SARS-CoV-2 by FDA under an Emergency Use Authorization (EUA).  This EUA will remain in effect (meaning this test can be used) for the duration of the COVID-19 declaration under Section 564(b)(1) of the Act, 21 U.S.C. section 360bbb-3(b)(1), unless the authorization is  terminated or revoked sooner. Performed at [facility], [address] [place], [place] [address]     RADIOLOGY:  Dg Lumbar Spine 2-3 Views  Result Date: 11/13/2018 CLINICAL DATA:  Lumbar surgery. EXAM: LUMBAR SPINE - 2-3 VIEW; DG C-ARM 1-60 MIN Radiation exposure index: 13.8 mGy. COMPARISON:  None. FINDINGS: Five intraoperative fluoroscopic images were obtained of the lumbar spine. These images demonstrate bilateral kyphoplasty being performed at 2 adjacent lumbar vertebral bodies. IMPRESSION: Fluoroscopic guidance provided during lumbar kyphoplasty. Electronically Signed   By: Lupita Raider M.D.   On: 11/13/2018 14:10   Ct Lumbar Spine Wo Contrast  Result Date: 11/10/2018 CLINICAL DATA:  Severe low back pain since the patient suffered a fall from standing yesterday. Initial encounter. EXAM: CT LUMBAR SPINE WITHOUT CONTRAST TECHNIQUE: Multidetector CT imaging of the lumbar spine was performed without intravenous contrast administration. Multiplanar CT image reconstructions were also generated. COMPARISON:  CT abdomen and pelvis 04/30/2018. FINDINGS: Segmentation: Standard. Alignment: Maintained. Vertebrae: Since the prior CT, the patient has suffered an L3 superior endplate compression fracture with vertebral body height loss of up to approximately 30%. The fracture does not involve the posterior elements. Remote, severe compression fracture of T12 is unchanged. No other fracture is identified. Paraspinal and other soft tissues: Small bilateral nonobstructing renal stones versus vascular calcifications are unchanged. Atherosclerosis is noted. Disc levels: T11-12: Negative. T12-L1: Mild bony retropulsion off the superior endplate of T12 without stenosis. L1-2: Minimal retropulsion off the superior endplate of L2 without stenosis. L2-3: Negative. L3-4:  Negative. L4-5: Shallow disc bulge and mild facet arthropathy.  No stenosis. L5-S1: Minimal disc bulge without stenosis. IMPRESSION: The  examination is positive for an acute superior endplate compression fracture of L2 with vertebral body height loss of up to approximately 30%. There is minimal retropulsion off the superior endplate of L2 but no central canal stenosis. Remote, severe T12 compression fracture appears unchanged. Electronically Signed   By: Drusilla Kanner M.D.   On: 11/10/2018 10:05   Mr Lumbar Spine Wo Contrast  Result Date: 11/11/2018 CLINICAL DATA:  Initial evaluation for acute traumatic injury, fracture. EXAM: MRI LUMBAR SPINE WITHOUT CONTRAST TECHNIQUE: Multiplanar, multisequence MR imaging of the lumbar spine was performed. No intravenous contrast was administered. COMPARISON:  Prior CT from 11/10/2018. FINDINGS: Segmentation: Standard. Lowest well-formed disc labeled the L5-S1 level. Alignment: Physiologic with preservation of the normal lumbar lordosis. No listhesis or subluxation. Vertebrae: Acute compression fracture involving the superior endplate of L2 with approximately 30% height loss and trace bony retropulsion, stable from prior CT. This is benign/mechanical in appearance. There is an additional compression fracture extending through the central and left aspect of the inferior endplate of L1, not visible on prior CT. No significant height loss or bony retropulsion. Chronic severe compression deformity of T12 again noted, unchanged. Vertebral body heights otherwise maintained with no other acute or subacute fracture. Underlying bone marrow signal intensity within normal limits. No discrete or worrisome osseous lesions. Conus medullaris and cauda equina: Conus extends to the L1-2 level. Conus and cauda equina appear normal. Paraspinal and other soft tissues: Paraspinous soft tissues within normal limits. Visualized visceral structures unremarkable. Disc levels: T11-12: Mild bony retropulsion related to the chronic T12 compression fracture, flattening the ventral thecal sac, greater on the right. No significant spinal  stenosis. Foramina remain patent. T12-L1: Chronic 5 mm bony retropulsion related to the T12 compression fracture flattening and partially effacing the ventral thecal sac. Mild spinal stenosis without cord impingement. Foramina remain patent. L1-2: Mild diffuse disc bulge, asymmetric to the left. Mild left-sided reactive endplate changes. No significant spinal stenosis. Moderate left L1 foraminal narrowing. L2-3: Mild disc bulge with disc desiccation. No canal or foraminal stenosis. L3-4: Mild disc bulge with disc desiccation. Mild facet hypertrophy. No canal or foraminal stenosis. L4-5: Disc desiccation without significant disc bulge. Mild facet hypertrophy. No canal or foraminal stenosis. L5-S1: Negative interspace. Mild facet hypertrophy. No canal or foraminal stenosis. IMPRESSION: 1. Acute compression fracture involving the superior endplate of L2 with 30% height loss and trace bony retropulsion, with no significant spinal stenosis. This is benign/mechanical in appearance. 2. Additional compression deformity involving the central and left aspect of the L1 inferior endplate without significant height loss or bony retropulsion. 3. Severe chronic compression deformity involving T12, stable. Electronically Signed   By: Rise Mu M.D.   On: 11/11/2018 22:17   Mr Knee Left Wo Contrast  Result Date: 11/12/2018 CLINICAL DATA:  Injury to the knee last week at work EXAM: MRI OF THE LEFT KNEE WITHOUT CONTRAST TECHNIQUE: Multiplanar, multisequence MR imaging of the knee was performed. No intravenous contrast was administered. COMPARISON:  None. FINDINGS: MENISCI Medial: There is an undersurface nondisplaced horizontal tear seen of the posterior horn of the medial meniscus. Lateral: Intact. LIGAMENTS Cruciates: The ACL is intact. The PCL is intact. Collaterals: The MCL is intact. There is intrasubstance signal changes seen at the popliteus tendon at the insertion site. The remainder of the lateral collateral  ligamentous complex is intact. CARTILAGE Patellofemoral: Mild chondral fissuring seen  throughout the patellar surface. Medial compartment: Mild chondral thinning seen the weight-bearing surface of the medial femoral condyle. Lateral compartment: Mild chondral thinning seen weight-bearing surface of the lateral femoral condyle. BONES: There is increased marrow signal seen throughout the lateral femoral condyle. There is also mildly increased signal seen lateral tibial plateau. No avascular necrosis. No pathologic marrow infiltration. JOINT: A moderate knee joint effusion is seen with debris edema seen within Hoffa's fat pad. No plical thickening. EXTENSOR MECHANISM: The patellar and quadriceps tendon are intact. The retinaculum is unremarkable. POPLITEAL FOSSA: No popliteal cyst. OTHER: There is also a mildly feathery signal seen in the popliteus muscle belly. Anterolateral and posterior lateral subcutaneous edema is noted. IMPRESSION: 1. Nondisplaced under surface tear of the posterior medial meniscus. 2. Intrasubstance sprain of the popliteus tendon with edema around the posterolateral corner of the knee. 3. Mild muscular edema of the popliteus 4. Osseous contusion involving the lateral femoral condyle and lateral tibial plateau. 5. Intact cruciate ligaments 6. Moderate knee joint effusion with debris 7. Mild tricompartmental chondral disease Electronically Signed   By: Jonna Clark M.D.   On: 11/12/2018 08:06   Dg C-arm 1-60 Min  Result Date: 11/13/2018 CLINICAL DATA:  Lumbar surgery. EXAM: LUMBAR SPINE - 2-3 VIEW; DG C-ARM 1-60 MIN Radiation exposure index: 13.8 mGy. COMPARISON:  None. FINDINGS: Five intraoperative fluoroscopic images were obtained of the lumbar spine. These images demonstrate bilateral kyphoplasty being performed at 2 adjacent lumbar vertebral bodies. IMPRESSION: Fluoroscopic guidance provided during lumbar kyphoplasty. Electronically Signed   By: Lupita Raider M.D.   On: 11/13/2018 14:10     EKG:   Orders placed or performed during the hospital encounter of 01/19/18  . ED EKG  . ED EKG  . ED EKG  . ED EKG  . EKG 12-Lead  . EKG 12-Lead      Management plans discussed with the patient, family and they are in agreement.  CODE STATUS:     Code Status Orders  (From admission, onward)         Start     Ordered   11/10/18 2022  Full code  Continuous     11/10/18 2021        Code Status History    Date Active Date Inactive Code Status Order ID Comments User Context   01/20/2018 0546 01/22/2018 1453 DNR [id number]  Arnaldo Natal, MD Inpatient   01/20/2018 0433 01/20/2018 0546 Full Code [id number]  Arnaldo Natal, MD ED   Advance Care Planning Activity      TOTAL TIME TAKING CARE OF THIS PATIENT: 43 minutes.   Note: This dictation was prepared with Dragon dictation along with smaller phrase technology. Any transcriptional errors that result from this process are unintentional.   @  on 11/14/2018 at 9:16 AM  Between 7am to 6pm - Pager - [phone]  After 6pm go to [email or web address] - password [email or web address] [facility]  [facility]  Office  [phone]  CC: Primary care physician; [facility]

## 2018-11-14 NOTE — Progress Notes (Signed)
Subjective: 1 Day Post-Op Procedure(s) (LRB): KYPHOPLASTY L1, L2 (N/A) Patient reports pain as mild, reports stiffness in his back. Feels that his back pain is much improved following kyphoplasty. Patient is well, and has had no acute complaints or problems Plan is to go home Negative for chest pain and shortness of breath Fever: no Gastrointestinal:Negative for nausea and vomiting  Objective: Vital signs in last 24 hours: Temp:  [97.5 F (36.4 C)-98.9 F (37.2 C)] 97.7 F (36.5 C) (09/26 0430) Pulse Rate:  [63-84] 63 (09/26 0430) Resp:  [11-19] 15 (09/26 0430) BP: (104-139)/(59-93) 139/75 (09/26 0430) SpO2:  [91 %-100 %] 92 % (09/26 0430) Weight:  [68.9 kg] 68.9 kg (09/25 1049)  Intake/Output from previous day:  Intake/Output Summary (Last 24 hours) at 11/14/2018 0813 Last data filed at 11/14/2018 0620 Gross per 24 hour  Intake 2217.26 ml  Output 1350 ml  Net 867.26 ml    Intake/Output this shift: No intake/output data recorded.  Labs: Recent Labs    11/12/18 0432 11/12/18 1614 11/13/18 0426 11/14/18 0416  HGB 13.6 14.0 13.5 12.5*   Recent Labs    11/13/18 0426 11/14/18 0416  WBC 9.5 12.1*  RBC 4.42 4.13*  HCT 41.0 37.7*  PLT 350 357   Recent Labs    11/13/18 0426 11/14/18 0416  NA 139 140  K 3.5 4.1  CL 101 104  CO2 27 22  BUN 20 17  CREATININE 0.95 0.78  GLUCOSE 114* 163*  CALCIUM 9.3 9.2   No results for input(s): LABPT, INR in the last 72 hours.  EXAM General - Patient is Alert, Appropriate and Oriented  Skin examination of his back shows two bandages, no erythema surrounding his dressings. Minimal tenderness with percussion over the vertebral bodies Motor Function - intact, moving foot and toes well on exam.   Past Medical History:  Diagnosis Date  . Anxiety   . Bipolar 1 disorder ([place])   . GERD (gastroesophageal reflux disease)   . Hyperlipidemia   . Hypertension   . Nephrolithiasis   . Stroke [facility])    December 2019    Assessment/Plan: 1 Day Post-Op Procedure(s) (LRB): KYPHOPLASTY L1, L2 (N/A) Active Problems:   Compression fracture of L2 ([facility])  Estimated body mass index is 25.28 kg/m as calculated from the following:   Height as of this encounter: 5\' 5"  (1.651 m).   Weight as of this encounter: 68.9 kg.  Pain is much improved. Up with PT today, avoid any axial overloading exercises. Upon discharge follow-up with [facility] orthopaedics in 10-14 days for repeat x-rays. Can remove bandages at home and shower.  DVT Prophylaxis - Foot Pumps, TED hose and Plavix  Raquel Philena Obey, PA-C [facility] Orthopaedic Surgery 11/14/2018, 8:13 AM

## 2018-11-14 NOTE — Consult Note (Addendum)
[facility] Face-to-Face Psychiatry Consult   Reason for Consult:  Depression, bipolar disorder Referring Physician: Dr  Amado Coe Patient Identification: Timothy Pham MRN:  [id number] Principal Diagnosis: adjustment disorder Diagnosis:  Principal Problem:   Compression fracture of L2 (HCC) Active Problems:   Depression  Total Time spent with patient: 30 minutes  Subjective:   Timothy Pham is a 53 y.o. male patient admitted with compression fracture.  There were some concerns for his depression after discussing his social situation with social worker noted by the psychiatrist.  Today, he reports, "I'm fine."  Denies suicidal/homicidal ideations, hallucinations, and substance abuse.  Patient seen and evaluated by this provider in person.  No suicidal/homicidal ideations, hallucinations, or substance abuse.  Irritable mood on assessment, reports he is discharging today and waiting for his brother Banker confirmed).  He is taking Wellbutrin and feels this works for him and did not want an increase in the dose.  Recommended therapy but did not seem interested today, resources will be placed in the discharge instructions.  HPI per psychiatrist on 11/12/18: Patient is a 53 year old male who presented to the hospital with a compression fracture in his spine and will likely need surgery in the coming days.  He recently expressed some depression to Child psychotherapist when discussing his social situation.  Patient reports that he has been somewhat depressed since his wife left him 3 years ago.  Patient reports that since that time he had gotten into another relationship but that also failed and he found himself without a home.  Patient reports that his brother recently had some bad words for him and told him "you messed up your marriage and now you messed up your back".  Patient recently got a new job at [employer] however he is concerned that he will lose the job following his time here in the hospital.  Patient denies feeling  depressed out of context.  He states that anybody would feel sad if given his circumstances.  Patient denies suicidal ideation intent or plan.  He states that he has no prior attempts and would never be able to do something like that.  Patient states that he has been eating and sleeping fine except that the pain medication makes him a little bit constipated.  Patient denies any history of homicidal thoughts or any current homicidal thoughts.  Patient does carry a diagnosis of bipolar disorder however patient does not endorse any of the past symptoms of mania or psychosis including insomnia or audio or visual hallucinations.  Patient reports that he has been taking medication from his primary care doctor.  Patient states that he feels as if the medication does help his mood and at this time he is not interested in increasing his dosing.  He is interested on working on his mental health on an outpatient basis.  Past Psychiatric History: Patient carries a diagnosis of bipolar disorder.  However he does not endorse any of the symptoms of bipolar disorder and states that in the past if anything he has been depressed.  Patient does endorse one prior psychiatric admission at age 59 for bulimia.  Patient states that he had been engaged in psychiatric care on and off throughout his life most recently he has been receiving medications from his primary care doctor.  Risk to Self:  no Risk to Others:  no Prior Inpatient Therapy:  yes Prior Outpatient Therapy:  yes  Past Medical History:  Past Medical History:  Diagnosis Date  . Anxiety   .  Bipolar 1 disorder (HCC)   . GERD (gastroesophageal reflux disease)   . Hyperlipidemia   . Hypertension   . Nephrolithiasis   . Stroke [facility])    December 2019    Past Surgical History:  Procedure Laterality Date  . CAROTID ANGIOGRAPHY Bilateral 01/21/2018   Procedure: CAROTID ANGIOGRAPHY;  Surgeon: Renford Dills, MD;  Location: [facility] INVASIVE CV LAB;  Service:  Cardiovascular;  Laterality: Bilateral;   Family History: Family History  Problem Relation Age of Onset  . Heart disease Mother   . Bipolar disorder Mother   . Heart disease Father   . Diabetes Mellitus II Father   . Obesity Sister   . Heart disease Brother    Family Psychiatric  History:  reports having a brother with bipolar disorder. Social History: patient was previously married for 25 years.  Social History   Substance and Sexual Activity  Alcohol Use Not Currently     Social History   Substance and Sexual Activity  Drug Use No    Social History   Socioeconomic History  . Marital status: Single    Spouse name: Not on file  . Number of children: Not on file  . Years of education: Not on file  . Highest education level: Not on file  Occupational History  . Not on file  Social Needs  . Financial resource strain: Not on file  . Food insecurity    Worry: Not on file    Inability: Not on file  . Transportation needs    Medical: Not on file    Non-medical: Not on file  Tobacco Use  . Smoking status: Current Every Day Smoker    Packs/day: 1.00    Types: Cigarettes  . Smokeless tobacco: Never Used  Substance and Sexual Activity  . Alcohol use: Not Currently  . Drug use: No  . Sexual activity: Not on file  Lifestyle  . Physical activity    Days per week: Not on file    Minutes per session: Not on file  . Stress: Not on file  Relationships  . Social Musician on phone: Not on file    Gets together: Not on file    Attends religious service: Not on file    Active member of club or organization: Not on file    Attends meetings of clubs or organizations: Not on file    Relationship status: Not on file  Other Topics Concern  . Not on file  Social History Narrative  . Not on file   Additional Social History:    Allergies:   No Active Allergies  Labs:  Results for orders placed or performed during the hospital encounter of 11/10/18 (from the  past 48 hour(s))  Urine Culture     Status: Abnormal (Preliminary result)   Collection Time: 11/12/18  3:27 PM   Specimen: Urine, Clean Catch  Result Value Ref Range   Specimen Description      URINE, CLEAN CATCH Performed at [facility], [address], [place], [place] [address]    Special Requests      Normal Performed at [facility], [address], [place], [place] [address]    Culture (A)     50,000 COLONIES/mL STAPHYLOCOCCUS HAEMOLYTICUS SUSCEPTIBILITIES TO FOLLOW Performed at [facility], [address] [place], [place] [address]    Report Status PENDING   Lactic acid, plasma     Status: None   Collection Time: 11/12/18  4:14 PM  Result Value Ref Range   Lactic Acid, Venous 0.9 0.5 - 1.9 mmol/L    Comment: Performed at [facility], [address] [place], [place] [address]  CULTURE, BLOOD (ROUTINE X 2) w Reflex to ID Panel     Status: None (Preliminary result)   Collection Time: 11/12/18  4:14 PM   Specimen: BLOOD  Result Value Ref Range   Specimen Description BLOOD BLOOD LEFT HAND    Special Requests      BOTTLES DRAWN AEROBIC AND ANAEROBIC Blood Culture adequate volume   Culture      NO GROWTH 2 DAYS Performed at [facility], [address], [place], [place] [address]    Report Status PENDING   Procalcitonin - Baseline     Status: None   Collection Time: 11/12/18  4:14 PM  Result Value Ref Range   Procalcitonin <0.10 ng/mL    Comment:        Interpretation: PCT (Procalcitonin) <= 0.5 ng/mL: Systemic infection (sepsis) is not likely. Local bacterial infection is possible. (NOTE)       Sepsis PCT Algorithm           Lower Respiratory Tract                                      Infection PCT Algorithm    ----------------------------     ----------------------------         PCT < 0.25 ng/mL                PCT < 0.10 ng/mL         Strongly encourage             Strongly discourage   discontinuation of  antibiotics    initiation of antibiotics    ----------------------------     -----------------------------       PCT 0.25 - 0.50 ng/mL            PCT 0.10 - 0.25 ng/mL               OR       >80% decrease in PCT            Discourage initiation of                                            antibiotics      Encourage discontinuation           of antibiotics    ----------------------------     -----------------------------         PCT >= 0.50 ng/mL              PCT 0.26 - 0.50 ng/mL               AND        <80% decrease in PCT             Encourage initiation of                                             antibiotics       Encourage continuation  of antibiotics    ----------------------------     -----------------------------        PCT >= 0.50 ng/mL                  PCT > 0.50 ng/mL               AND         increase in PCT                  Strongly encourage                                      initiation of antibiotics    Strongly encourage escalation           of antibiotics                                     -----------------------------                                           PCT <= 0.25 ng/mL                                                 OR                                        > 80% decrease in PCT                                     Discontinue / Do not initiate                                             antibiotics Performed at [facility], [address] [place], [place] [address]   CBC     Status: None   Collection Time: 11/12/18  4:14 PM  Result Value Ref Range   WBC 10.2 4.0 - 10.5 K/uL   RBC 4.62 4.22 - 5.81 MIL/uL   Hemoglobin 14.0 13.0 - 17.0 g/dL   HCT 60.4 54.0 - 98.1 %   MCV 97.0 80.0 - 100.0 fL   MCH 30.3 26.0 - 34.0 pg   MCHC 31.3 30.0 - 36.0 g/dL   RDW 19.1 47.8 - 29.5 %   Platelets 326 150 - 400 K/uL   nRBC 0.0 0.0 - 0.2 %    Comment: Performed at [facility], [address] [place], [place] [address]   CULTURE, BLOOD (ROUTINE X 2) w Reflex to ID Panel     Status: None (Preliminary result)   Collection Time: 11/12/18  4:26 PM   Specimen: BLOOD  Result Value Ref Range   Specimen Description BLOOD BLOOD RIGHT HAND    Special Requests      BOTTLES DRAWN AEROBIC AND ANAEROBIC Blood Culture  adequate volume   Culture      NO GROWTH 2 DAYS Performed at [facility], [address] [place], [place] [address]    Report Status PENDING   Influenza panel by PCR (type A & B)     Status: None   Collection Time: 11/12/18  4:50 PM  Result Value Ref Range   Influenza A By PCR NEGATIVE NEGATIVE   Influenza B By PCR NEGATIVE NEGATIVE    Comment: (NOTE) The Xpert Xpress Flu assay is intended as an aid in the diagnosis of  influenza and should not be used as a sole basis for treatment.  This  assay is FDA approved for nasopharyngeal swab specimens only. Nasal  washings and aspirates are unacceptable for Xpert Xpress Flu testing. Performed at [facility], [address] [place], [place] [address]   SARS Coronavirus 2 [facility] order, Performed in [facility] hospital lab) Nasopharyngeal     Status: None   Collection Time: 11/12/18  4:50 PM   Specimen: Nasopharyngeal  Result Value Ref Range   SARS Coronavirus 2 NEGATIVE NEGATIVE    Comment: (NOTE) If result is NEGATIVE SARS-CoV-2 target nucleic acids are NOT DETECTED. The SARS-CoV-2 RNA is generally detectable in upper and lower  respiratory specimens during the acute phase of infection. The lowest  concentration of SARS-CoV-2 viral copies this assay can detect is 250  copies / mL. A negative result does not preclude SARS-CoV-2 infection  and should not be used as the sole basis for treatment or other  patient management decisions.  A negative result may occur with  improper specimen collection / handling, submission of specimen other  than nasopharyngeal swab, presence of viral mutation(s) within the  areas targeted by  this assay, and inadequate number of viral copies  (<250 copies / mL). A negative result must be combined with clinical  observations, patient history, and epidemiological information. If result is POSITIVE SARS-CoV-2 target nucleic acids are DETECTED. The SARS-CoV-2 RNA is generally detectable in upper and lower  respiratory specimens dur ing the acute phase of infection.  Positive  results are indicative of active infection with SARS-CoV-2.  Clinical  correlation with patient history and other diagnostic information is  necessary to determine patient infection status.  Positive results do  not rule out bacterial infection or co-infection with other viruses. If result is PRESUMPTIVE POSTIVE SARS-CoV-2 nucleic acids MAY BE PRESENT.   A presumptive positive result was obtained on the submitted specimen  and confirmed on repeat testing.  While 2019 novel coronavirus  (SARS-CoV-2) nucleic acids may be present in the submitted sample  additional confirmatory testing may be necessary for epidemiological  and / or clinical management purposes  to differentiate between  SARS-CoV-2 and other Sarbecovirus currently known to infect humans.  If clinically indicated additional testing with an alternate test  methodology [phone]) is advised. The SARS-CoV-2 RNA is generally  detectable in upper and lower respiratory sp ecimens during the acute  phase of infection. The expected result is Negative. Fact Sheet for Patients:  BoilerBrush.com.cy Fact Sheet for Healthcare Providers: https://pope.com/ This test is not yet approved or cleared by the [place] FDA and has been authorized for detection and/or diagnosis of SARS-CoV-2 by FDA under an Emergency Use Authorization (EUA).  This EUA will remain in effect (meaning this test can be used) for the duration of the COVID-19 declaration under Section 564(b)(1) of the Act, 21 U.S.C. section  360bbb-3(b)(1), unless the authorization is terminated or revoked  sooner. Performed at [facility], [address] [place], [place] [address]   Lactic acid, plasma     Status: None   Collection Time: 11/12/18  6:49 PM  Result Value Ref Range   Lactic Acid, Venous 1.4 0.5 - 1.9 mmol/L    Comment: Performed at [facility], [address] [place], [place] [address]  Procalcitonin     Status: None   Collection Time: 11/13/18  4:26 AM  Result Value Ref Range   Procalcitonin <0.10 ng/mL    Comment:        Interpretation: PCT (Procalcitonin) <= 0.5 ng/mL: Systemic infection (sepsis) is not likely. Local bacterial infection is possible. (NOTE)       Sepsis PCT Algorithm           Lower Respiratory Tract                                      Infection PCT Algorithm    ----------------------------     ----------------------------         PCT < 0.25 ng/mL                PCT < 0.10 ng/mL         Strongly encourage             Strongly discourage   discontinuation of antibiotics    initiation of antibiotics    ----------------------------     -----------------------------       PCT 0.25 - 0.50 ng/mL            PCT 0.10 - 0.25 ng/mL               OR       >80% decrease in PCT            Discourage initiation of                                            antibiotics      Encourage discontinuation           of antibiotics    ----------------------------     -----------------------------         PCT >= 0.50 ng/mL              PCT 0.26 - 0.50 ng/mL               AND        <80% decrease in PCT             Encourage initiation of                                             antibiotics       Encourage continuation           of antibiotics    ----------------------------     -----------------------------        PCT >= 0.50 ng/mL                  PCT > 0.50 ng/mL               AND  increase in PCT                  Strongly encourage                                       initiation of antibiotics    Strongly encourage escalation           of antibiotics                                     -----------------------------                                           PCT <= 0.25 ng/mL                                                 OR                                        > 80% decrease in PCT                                     Discontinue / Do not initiate                                             antibiotics Performed at [facility], [address] [place], [place] [address]   CBC with Differential/Platelet     Status: Abnormal   Collection Time: 11/13/18  4:26 AM  Result Value Ref Range   WBC 9.5 4.0 - 10.5 K/uL   RBC 4.42 4.22 - 5.81 MIL/uL   Hemoglobin 13.5 13.0 - 17.0 g/dL   HCT 60.441.0 54.039.0 - 98.152.0 %   MCV 92.8 80.0 - 100.0 fL   MCH 30.5 26.0 - 34.0 pg   MCHC 32.9 30.0 - 36.0 g/dL   RDW 19.113.2 47.811.5 - 29.515.5 %   Platelets 350 150 - 400 K/uL   nRBC 0.0 0.0 - 0.2 %   Neutrophils Relative % 71 %   Neutro Abs 6.7 1.7 - 7.7 K/uL   Lymphocytes Relative 14 %   Lymphs Abs 1.3 0.7 - 4.0 K/uL   Monocytes Relative 12 %   Monocytes Absolute 1.1 (H) 0.1 - 1.0 K/uL   Eosinophils Relative 3 %   Eosinophils Absolute 0.3 0.0 - 0.5 K/uL   Basophils Relative 0 %   Basophils Absolute 0.0 0.0 - 0.1 K/uL   Immature Granulocytes 0 %   Abs Immature Granulocytes 0.04 0.00 - 0.07 K/uL    Comment: Performed at [facility], [address] [place], [place] [address]  Basic metabolic panel     Status: Abnormal   Collection Time: 11/13/18  4:26 AM  Result Value Ref Range   Sodium 139 135 - 145 mmol/L  Potassium 3.5 3.5 - 5.1 mmol/L   Chloride 101 98 - 111 mmol/L   CO2 27 22 - 32 mmol/L   Glucose, Bld 114 (H) 70 - 99 mg/dL   BUN 20 6 - 20 mg/dL   Creatinine, Ser 4.54 0.61 - 1.24 mg/dL   Calcium 9.3 8.9 - 09.8 mg/dL   GFR calc non Af Amer >60 >60 mL/min   GFR calc Af Amer >60 >60 mL/min   Anion gap 11 5 - 15    Comment: Performed at  [facility], [address] [place], [place] [address]  Procalcitonin     Status: None   Collection Time: 11/14/18  4:16 AM  Result Value Ref Range   Procalcitonin <0.10 ng/mL    Comment:        Interpretation: PCT (Procalcitonin) <= 0.5 ng/mL: Systemic infection (sepsis) is not likely. Local bacterial infection is possible. (NOTE)       Sepsis PCT Algorithm           Lower Respiratory Tract                                      Infection PCT Algorithm    ----------------------------     ----------------------------         PCT < 0.25 ng/mL                PCT < 0.10 ng/mL         Strongly encourage             Strongly discourage   discontinuation of antibiotics    initiation of antibiotics    ----------------------------     -----------------------------       PCT 0.25 - 0.50 ng/mL            PCT 0.10 - 0.25 ng/mL               OR       >80% decrease in PCT            Discourage initiation of                                            antibiotics      Encourage discontinuation           of antibiotics    ----------------------------     -----------------------------         PCT >= 0.50 ng/mL              PCT 0.26 - 0.50 ng/mL               AND        <80% decrease in PCT             Encourage initiation of                                             antibiotics       Encourage continuation           of antibiotics    ----------------------------     -----------------------------        PCT >= 0.50 ng/mL  PCT > 0.50 ng/mL               AND         increase in PCT                  Strongly encourage                                      initiation of antibiotics    Strongly encourage escalation           of antibiotics                                     -----------------------------                                           PCT <= 0.25 ng/mL                                                 OR                                        > 80% decrease in  PCT                                     Discontinue / Do not initiate                                             antibiotics Performed at [facility], [address] [place], [place] [address]   CBC with Differential/Platelet     Status: Abnormal   Collection Time: 11/14/18  4:16 AM  Result Value Ref Range   WBC 12.1 (H) 4.0 - 10.5 K/uL   RBC 4.13 (L) 4.22 - 5.81 MIL/uL   Hemoglobin 12.5 (L) 13.0 - 17.0 g/dL   HCT 29.5 (L) 28.4 - 13.2 %   MCV 91.3 80.0 - 100.0 fL   MCH 30.3 26.0 - 34.0 pg   MCHC 33.2 30.0 - 36.0 g/dL   RDW 44.0 10.2 - 72.5 %   Platelets 357 150 - 400 K/uL   nRBC 0.0 0.0 - 0.2 %   Neutrophils Relative % 85 %   Neutro Abs 10.4 (H) 1.7 - 7.7 K/uL   Lymphocytes Relative 6 %   Lymphs Abs 0.7 0.7 - 4.0 K/uL   Monocytes Relative 8 %   Monocytes Absolute 1.0 0.1 - 1.0 K/uL   Eosinophils Relative 0 %   Eosinophils Absolute 0.0 0.0 - 0.5 K/uL   Basophils Relative 0 %   Basophils Absolute 0.0 0.0 - 0.1 K/uL   Immature Granulocytes 1 %   Abs Immature Granulocytes 0.06 0.00 - 0.07 K/uL    Comment: Performed at [facility], [address], [place], [place] [address]  Basic metabolic panel     Status: Abnormal   Collection Time: 11/14/18  4:16 AM  Result Value Ref Range   Sodium 140 135 - 145 mmol/L   Potassium 4.1 3.5 - 5.1 mmol/L   Chloride 104 98 - 111 mmol/L   CO2 22 22 - 32 mmol/L   Glucose, Bld 163 (H) 70 - 99 mg/dL   BUN 17 6 - 20 mg/dL   Creatinine, Ser 1.61 0.61 - 1.24 mg/dL   Calcium 9.2 8.9 - 09.6 mg/dL   GFR calc non Af Amer >60 >60 mL/min   GFR calc Af Amer >60 >60 mL/min   Anion gap 14 5 - 15    Comment: Performed at [facility], [address] [place], [place] [address]    Current Facility-Administered Medications  Medication Dose Route Frequency Provider Last Rate Last Dose  . 0.9 %  sodium chloride infusion   Intravenous Continuous Kennedy Bucker, MD 100 mL/hr at 11/13/18 0901    . 0.9 %  sodium chloride  infusion   Intravenous Continuous Kennedy Bucker, MD 75 mL/hr at 11/14/18 0600    . acetaminophen (TYLENOL) tablet 650 mg  650 mg Oral Q6H PRN Kennedy Bucker, MD   650 mg at 11/14/18 9811   Or  . acetaminophen (TYLENOL) suppository 650 mg  650 mg Rectal Q6H PRN Kennedy Bucker, MD      . allopurinol (ZYLOPRIM) tablet 100 mg  100 mg Oral Daily Kennedy Bucker, MD   100 mg at 11/14/18 0917  . amLODipine (NORVASC) tablet 5 mg  5 mg Oral Daily Kennedy Bucker, MD   5 mg at 11/14/18 9147  . aspirin EC tablet 81 mg  81 mg Oral Daily Kennedy Bucker, MD   81 mg at 11/14/18 8295  . atorvastatin (LIPITOR) tablet 40 mg  40 mg Oral q1800 Kennedy Bucker, MD   40 mg at 11/13/18 1659  . bisacodyl (DULCOLAX) EC tablet 5 mg  5 mg Oral Daily PRN Kennedy Bucker, MD   5 mg at 11/14/18 6213  . buPROPion (WELLBUTRIN XL) 24 hr tablet 150 mg  150 mg Oral Daily Kennedy Bucker, MD   150 mg at 11/14/18 0865  . Chlorhexidine Gluconate Cloth 2 % PADS 6 each  6 each Topical Daily Kennedy Bucker, MD   6 each at 11/14/18 [phone]  . clopidogrel (PLAVIX) tablet 75 mg  75 mg Oral Daily Kennedy Bucker, MD   75 mg at 11/14/18 0917  . colchicine tablet 0.6 mg  0.6 mg Oral QID Kennedy Bucker, MD   0.6 mg at 11/14/18 0917  . docusate sodium (COLACE) capsule 100 mg  100 mg Oral BID Kennedy Bucker, MD   100 mg at 11/14/18 0917  . feeding supplement (ENSURE ENLIVE) (ENSURE ENLIVE) liquid 237 mL  237 mL Oral BID BM Kennedy Bucker, MD   237 mL at 11/14/18 0917  . HYDROmorphone (DILAUDID) injection 1 mg  1 mg Intravenous Q4H PRN Kennedy Bucker, MD   1 mg at 11/12/18 9629  . loratadine (CLARITIN) tablet 10 mg  10 mg Oral Daily PRN Kennedy Bucker, MD      . magnesium citrate solution 1 Bottle  1 Bottle Oral Once PRN Kennedy Bucker, MD      . magnesium hydroxide (MILK OF MAGNESIA) suspension 30 mL  30 mL Oral Daily PRN Kennedy Bucker, MD      . methocarbamol (ROBAXIN) tablet 500 mg  500 mg Oral Q6H PRN Kennedy Bucker, MD   500 mg  at 11/13/18 2103   Or  . methocarbamol  (ROBAXIN) 500 mg in dextrose 5 % 50 mL IVPB  500 mg Intravenous Q6H PRN Hessie Knows, MD      . metoCLOPramide (REGLAN) tablet 5-10 mg  5-10 mg Oral Q8H PRN Hessie Knows, MD       Or  . metoCLOPramide (REGLAN) injection 5-10 mg  5-10 mg Intravenous Q8H PRN Hessie Knows, MD      . ondansetron [facility]) tablet 4 mg  4 mg Oral Q6H PRN Hessie Knows, MD       Or  . ondansetron [facility]) injection 4 mg  4 mg Intravenous Q6H PRN Hessie Knows, MD      . oxyCODONE (Oxy IR/ROXICODONE) immediate release tablet 5 mg  5 mg Oral Q4H PRN Hessie Knows, MD   5 mg at 11/13/18 0853  . pantoprazole (PROTONIX) EC tablet 40 mg  40 mg Oral Daily Hessie Knows, MD   40 mg at 11/14/18 0917  . polyethylene glycol (MIRALAX / GLYCOLAX) packet 17 g  17 g Oral Daily PRN Hessie Knows, MD      . zolpidem (AMBIEN) tablet 5 mg  5 mg Oral QHS PRN,MR X 1 Hessie Knows, MD        Musculoskeletal: Strength & Muscle Tone: within normal limits and decreased Gait & Station: unable to stand Patient leans: N/A  Psychiatric Specialty Exam: Physical Exam  Nursing note and vitals reviewed. Constitutional: He is oriented to person, place, and time. He appears well-developed and well-nourished.  HENT:  Head: Normocephalic.  Neck: Normal range of motion.  Respiratory: Effort normal.  Neurological: He is alert and oriented to person, place, and time.  Psychiatric: His speech is normal and behavior is normal. Judgment and thought content normal. His affect is blunt. Cognition and memory are normal. He exhibits a depressed mood.    Review of Systems  Constitutional: Positive for malaise/fatigue.  Eyes: Negative for photophobia.  Cardiovascular: Negative for chest pain.  Musculoskeletal: Positive for back pain.  Neurological: Negative for headaches.  Psychiatric/Behavioral: Positive for depression. Negative for suicidal ideas. The patient does not have insomnia.   All other systems reviewed and are negative.   Blood pressure  (!) 132/57, pulse 66, temperature 97.9 F (36.6 C), temperature source Oral, resp. rate 15, height 5\' 5"  (1.651 m), weight 68.9 kg, SpO2 93 %.Body mass index is 25.28 kg/m.  General Appearance: Fairly Groomed  Eye Contact:  Fair  Speech:  Normal Rate  Volume:  Normal  Mood:  Dysthymic, irritable  Affect:  Congruent  Thought Process:  Coherent  Orientation:  Full (Time, Place, and Person)  Thought Content:  Logical  Suicidal Thoughts:  No  Homicidal Thoughts:  No  Memory:  Recent;   Good  Judgement:  Intact  Insight:  Fair  Psychomotor Activity:  Decreased  Concentration:  Concentration: Good  Recall:  Good  Fund of Knowledge:  Good  Language:  Good  Akathisia:  No  Handed:  Right  AIMS (if indicated):     Assets:  Desire for Improvement  ADL's:  Impaired  Cognition:  WNL  Sleep:        Treatment Plan Summary: Major depressive disorder, recurrent, mild: -Continue Wellbutrin 150 mg daily -[facility] outpatient resources provided  Disposition: Psychiatrically stable, discharging from medical floor today  Waylan Boga, NP 11/14/2018 11:56 AM   Case discussed and plan agreed upon as outlined above by Dr. lowered.

## 2018-11-14 NOTE — Consult Note (Signed)
ORTHOPAEDIC CONSULTATION  REQUESTING PHYSICIAN: Ramonita Lab, MD  Chief Complaint:   L knee pain  History of Present Illness: Timothy Pham is a 53 y.o. male who had a pop in his knee ~10 days ago when he was putting on his left shoe, he was able to work all day and ambulate with some discomfort. When he got home he was walking up the steps and "tweaked it again." He has had pain since that time. He presented to the ED on 11/07/18 where x-rays showed a knee effusion and he was discharged. He then had a fall backwards onto the floor on 11/10/2018 with severe back pain.  Imaging in the emergency department showed an L2 compression fracture.   He then underwent kyphoplasty by Dr. Trilby Drummer on 11/13/2018 due to severe pain and inability to ambulate.  He then had significant improvement in his symptoms of back pain postoperatively and he was able to ambulate today with physical therapy.  However, he still having significant knee pain.  This pain is located on the medial aspect of the knee.  He has some difficulty with flexion.  Pain described as a dull ache at baseline and a sharp, stabbing pain at its worst.  He rates this pain as a 5 and out of 10 in severity.  Pain is improved with rest and worsened with twisting motions of the knee with weightbearing.  MRI of the knee showed a medial meniscus tear.  Past Medical History:  Diagnosis Date  . Anxiety   . Bipolar 1 disorder (HCC)   . GERD (gastroesophageal reflux disease)   . Hyperlipidemia   . Hypertension   . Nephrolithiasis   . Stroke [facility])    December 2019   Past Surgical History:  Procedure Laterality Date  . CAROTID ANGIOGRAPHY Bilateral 01/21/2018   Procedure: CAROTID ANGIOGRAPHY;  Surgeon: Renford Dills, MD;  Location: [facility] INVASIVE CV LAB;  Service: Cardiovascular;  Laterality: Bilateral;   Social History   Socioeconomic History  . Marital status: Single    Spouse name:  Not on file  . Number of children: Not on file  . Years of education: Not on file  . Highest education level: Not on file  Occupational History  . Not on file  Social Needs  . Financial resource strain: Not on file  . Food insecurity    Worry: Not on file    Inability: Not on file  . Transportation needs    Medical: Not on file    Non-medical: Not on file  Tobacco Use  . Smoking status: Current Every Day Smoker    Packs/day: 1.00    Types: Cigarettes  . Smokeless tobacco: Never Used  Substance and Sexual Activity  . Alcohol use: Not Currently  . Drug use: No  . Sexual activity: Not on file  Lifestyle  . Physical activity    Days per week: Not on file    Minutes per session: Not on file  . Stress: Not on file  Relationships  . Social Musician on phone: Not on file    Gets together: Not on file    Attends religious service: Not on file    Active member of club or organization: Not on file    Attends meetings of clubs or organizations: Not on file    Relationship status: Not on file  Other Topics Concern  . Not on file  Social History Narrative  . Not on file   Family History  Problem Relation Age of Onset  . Heart disease Mother   . Bipolar disorder Mother   . Heart disease Father   . Diabetes Mellitus II Father   . Obesity Sister   . Heart disease Brother    No Active Allergies Prior to Admission medications   Medication Sig Start Date End Date Taking? Authorizing Provider  aspirin EC 81 MG tablet Take 81 mg by mouth daily. 01/22/18  Yes [provider]  atorvastatin (LIPITOR) 40 MG tablet Take 1 tablet (40 mg total) by mouth daily at 6 PM. 03/27/18  Yes Georgiana Spinner, NP  buPROPion (WELLBUTRIN XL) 150 MG 24 hr tablet Take 150 mg by mouth daily. 05/13/18 05/13/19 Yes [provider]  clopidogrel (PLAVIX) 75 MG tablet Take 1 tablet (75 mg total) by mouth daily. 03/27/18  Yes Georgiana Spinner, NP  loratadine (CLARITIN) 10 MG tablet Take 10  mg by mouth daily. 05/11/18  Yes [provider]  omeprazole (PRILOSEC) 20 MG capsule Take 20 mg by mouth 2 (two) times daily. 05/08/18  Yes [provider]  traMADol (ULTRAM) 50 MG tablet Take 1 tablet (50 mg total) by mouth every 6 (six) hours as needed. 11/07/18 11/07/19 Yes Jene Every, MD  acetaminophen (TYLENOL) 325 MG tablet Take 2 tablets (650 mg total) by mouth every 6 (six) hours as needed for mild pain (or Fever >/= 101). 11/14/18   Gouru, Aruna, MD  allopurinol (ZYLOPRIM) 100 MG tablet Take 1 tablet (100 mg total) by mouth daily. 11/14/18   Gouru, Deanna Artis, MD  amLODipine (NORVASC) 5 MG tablet Take 1 tablet (5 mg total) by mouth daily. 11/14/18   Ramonita Lab, MD  colchicine 0.6 MG tablet Take 1 tablet (0.6 mg total) by mouth daily. 11/14/18   Ramonita Lab, MD  docusate sodium (COLACE) 100 MG capsule Take 1 capsule (100 mg total) by mouth daily. 11/14/18   Gouru, Deanna Artis, MD  feeding supplement, ENSURE ENLIVE, (ENSURE ENLIVE) LIQD Take 237 mLs by mouth 2 (two) times daily between meals. 11/14/18   Ramonita Lab, MD  ibuprofen (ADVIL) 600 MG tablet Take 1 tablet (600 mg total) by mouth every 8 (eight) hours as needed for moderate pain or cramping. Take with food 11/14/18   Gouru, Deanna Artis, MD  methocarbamol (ROBAXIN) 500 MG tablet Take 1 tablet (500 mg total) by mouth every 6 (six) hours as needed for muscle spasms. 11/14/18   Ramonita Lab, MD   Recent Labs    11/12/18 1610 11/12/18 1614 11/13/18 0426 11/14/18 0416  WBC 9.7 10.2 9.5 12.1*  HGB 13.6 14.0 13.5 12.5*  HCT 40.5 44.8 41.0 37.7*  PLT 344 326 350 357  K 3.8  --  3.5 4.1  CL 100  --  101 104  CO2 23  --  27 22  BUN 19  --  20 17  CREATININE 0.81  --  0.95 0.78  GLUCOSE 105*  --  114* 163*  CALCIUM 9.2  --  9.3 9.2   Dg Lumbar Spine 2-3 Views  Result Date: 11/13/2018 CLINICAL DATA:  Lumbar surgery. EXAM: LUMBAR SPINE - 2-3 VIEW; DG C-ARM 1-60 MIN Radiation exposure index: 13.8 mGy. COMPARISON:  None. FINDINGS: Five  intraoperative fluoroscopic images were obtained of the lumbar spine. These images demonstrate bilateral kyphoplasty being performed at 2 adjacent lumbar vertebral bodies. IMPRESSION: Fluoroscopic guidance provided during lumbar kyphoplasty. Electronically Signed   By: Lupita Raider M.D.   On: 11/13/2018 14:10   Dg C-arm 1-60 Min  Result Date: 11/13/2018 CLINICAL DATA:  Lumbar surgery. EXAM: LUMBAR SPINE - 2-3 VIEW; DG C-ARM 1-60 MIN Radiation exposure index: 13.8 mGy. COMPARISON:  None. FINDINGS: Five intraoperative fluoroscopic images were obtained of the lumbar spine. These images demonstrate bilateral kyphoplasty being performed at 2 adjacent lumbar vertebral bodies. IMPRESSION: Fluoroscopic guidance provided during lumbar kyphoplasty. Electronically Signed   By: Marijo Conception M.D.   On: 11/13/2018 14:10     Positive ROS: All other systems have been reviewed and were otherwise negative with the exception of those mentioned in the HPI and as above.  Physical Exam: BP (!) 132/57   Pulse 66   Temp 97.9 F (36.6 C) (Oral)   Resp 15   Ht 5\' 5"  (1.651 m)   Wt 68.9 kg   SpO2 93%   BMI 25.28 kg/m  General:  Alert, no acute distress Psychiatric:  Patient is competent for consent with normal mood and affect   Cardiovascular:  No pedal edema, regular rate and rhythm Respiratory:  No wheezing, non-labored breathing GI:  Abdomen is soft and non-tender Skin:  No lesions in the area of chief complaint, no erythema Neurologic:  Sensation intact distally, CN grossly intact Lymphatic:  No axillary or cervical lymphadenopathy  Orthopedic Exam:  LLE: 5/5 DF/PF/EHL SILT s/s/t/sp/dp distr Foot wwp RoM 0-70 Significant tenderness over the medial joint line, moderate tenderness over the lateral joint line Painful McMurray's Pain with hyperextension at the medial joint line   Imaging: L knee MRI:  11/11/18: FINDINGS: MENISCI  Medial: There is an undersurface nondisplaced horizontal tear  seen of the posterior horn of the medial meniscus.  Lateral: Intact.  LIGAMENTS  Cruciates: The ACL is intact. The PCL is intact.  Collaterals: The MCL is intact. There is intrasubstance signal changes seen at the popliteus tendon at the insertion site. The remainder of the lateral collateral ligamentous complex is intact.  CARTILAGE  Patellofemoral: Mild chondral fissuring seen throughout the patellar surface.  Medial compartment: Mild chondral thinning seen the weight-bearing surface of the medial femoral condyle.  Lateral compartment: Mild chondral thinning seen weight-bearing surface of the lateral femoral condyle.  BONES: There is increased marrow signal seen throughout the lateral femoral condyle. There is also mildly increased signal seen lateral tibial plateau. No avascular necrosis. No pathologic marrow infiltration.  JOINT: A moderate knee joint effusion is seen with debris edema seen within Hoffa's fat pad. No plical thickening.  EXTENSOR MECHANISM: The patellar and quadriceps tendon are intact. The retinaculum is unremarkable.  POPLITEAL FOSSA: No popliteal cyst.  OTHER: There is also a mildly feathery signal seen in the popliteus muscle belly. Anterolateral and posterior lateral subcutaneous edema is noted.  IMPRESSION: 1. Nondisplaced under surface tear of the posterior medial meniscus. 2. Intrasubstance sprain of the popliteus tendon with edema around the posterolateral corner of the knee. 3. Mild muscular edema of the popliteus 4. Osseous contusion involving the lateral femoral condyle and lateral tibial plateau. 5. Intact cruciate ligaments 6. Moderate knee joint effusion with debris 7. Mild tricompartmental chondral disease  Assessment/Plan: Braydin Aloi is a 53 y.o. male with a medial meniscus tear and s/p L2 kyphoplasty on 11/13/2018.   1. I discussed the various treatment options including both surgical and non-surgical  management of her fracture with the patient.  He does wish to get back to work as he is started a new job.  Nonsurgical management consist of corticosteroid injection and physical therapy.  Surgical management could consist of arthroscopic medial meniscus repair  versus partial meniscectomy.  He can be weightbearing as tolerated and does not necessarily need use of brace.  I recommended continuing to work on knee flexion exercises on his own.  2.  We agreed to allow him to regain his normal amount of mobility after his kyphoplasty prior to any further intervention for his knee.  3.  Plan will be for him to follow-up with me as an outpatient in approximately 1-2 weeks for reevaluation and further treatment plan.    Signa KellSunny Linn Clavin   11/14/2018 12:01 PM

## 2018-11-14 NOTE — Discharge Instructions (Signed)
F/U WITH PCP in 3 days  F/u with ortho in 2 weeks  [facility] & [facility]  Mental health service in [place] Address: [address] [place], [place] [address] Hours:  Closed ? Caesar Bookman Phone: [phone]

## 2018-11-15 LAB — URINE CULTURE
Culture: 50000 — AB
Special Requests: NORMAL

## 2018-11-16 ENCOUNTER — Encounter: Payer: Self-pay | Admitting: Orthopedic Surgery

## 2018-11-16 LAB — SURGICAL PATHOLOGY

## 2018-11-16 NOTE — Progress Notes (Addendum)
9/28: Per Lauretta Chester home health representative her supervisor told her they would accept patient for home health charity care. Clinical Education officer, museum (CSW) emailed home health orders to [place] and made her aware that patient discharged home on 9/26.   [employer], LCSW [phone]

## 2018-11-16 NOTE — Progress Notes (Signed)
9/28: Clinical Social Worker (CSW) received a call from [facility] home health social work [employer] [phone] asking if patient has applied for medicaid. Per Lizbeth Bark financial counselor she did not complete a medicaid application for patient because patient will not have a permanent disability because he has a return to work note on Oct. 3rd, 2020. CSW made [facility] aware of above.   [employer], LCSW [phone]

## 2018-11-17 ENCOUNTER — Encounter: Payer: Self-pay | Admitting: Anesthesiology

## 2018-11-17 LAB — CULTURE, BLOOD (ROUTINE X 2)
Culture: NO GROWTH
Culture: NO GROWTH
Special Requests: ADEQUATE
Special Requests: ADEQUATE

## 2018-11-18 ENCOUNTER — Other Ambulatory Visit
Admission: RE | Admit: 2018-11-18 | Discharge: 2018-11-18 | Disposition: A | Discharge status: Home / Self Care | Payer: HRSA Program | Source: Ambulatory Visit | Attending: Orthopedic Surgery | Admitting: Orthopedic Surgery

## 2018-11-18 ENCOUNTER — Encounter: Payer: Self-pay | Admitting: *Deleted

## 2018-11-18 ENCOUNTER — Other Ambulatory Visit: Admission: RE | Admit: 2018-11-18 | Payer: Self-pay | Source: Ambulatory Visit

## 2018-11-18 ENCOUNTER — Other Ambulatory Visit: Admission: RE | Admit: 2018-11-18 | Payer: 59 | Source: Ambulatory Visit

## 2018-11-18 ENCOUNTER — Other Ambulatory Visit: Payer: Self-pay

## 2018-11-18 DIAGNOSIS — Z20828 Contact with and (suspected) exposure to other viral communicable diseases: Secondary | ICD-10-CM | POA: Diagnosis not present

## 2018-11-18 DIAGNOSIS — Z01812 Encounter for preprocedural laboratory examination: Secondary | ICD-10-CM | POA: Diagnosis present

## 2018-11-18 LAB — SARS CORONAVIRUS 2 (TAT 6-24 HRS): SARS Coronavirus 2: NEGATIVE

## 2018-11-18 NOTE — Anesthesia Preprocedure Evaluation (Addendum)
Anesthesia Evaluation  Patient identified by MRN, date of birth, ID band Patient awake    Reviewed: Allergy & Precautions, NPO status , Patient's Chart, lab work & pertinent test results  History of Anesthesia Complications Negative for: history of anesthetic complications  Airway Mallampati: IV   Neck ROM: Full    Dental  (+)    Pulmonary Current Smoker (1/2 ppd)Patient did not abstain from smoking.,    Pulmonary exam normal breath sounds clear to auscultation       Cardiovascular hypertension, Normal cardiovascular exam Rhythm:Regular Rate:Normal     Neuro/Psych PSYCHIATRIC DISORDERS Anxiety Bipolar Disorder CVA (2019 on Plavix, last dose 11/18/18)    GI/Hepatic GERD  ,  Endo/Other  negative endocrine ROS  Renal/GU Renal disease (nephrolithiasis)     Musculoskeletal   Abdominal   Peds  Hematology negative hematology ROS (+)   Anesthesia Other Findings   Reproductive/Obstetrics                            Anesthesia Physical Anesthesia Plan  ASA: III  Anesthesia Plan: General   Post-op Pain Management:    Induction: Intravenous  PONV Risk Score and Plan: 1 and Dexamethasone and Ondansetron  Airway Management Planned: LMA  Additional Equipment:   Intra-op Plan:   Post-operative Plan: Extubation in OR  Informed Consent: I have reviewed the patients History and Physical, chart, labs and discussed the procedure including the risks, benefits and alternatives for the proposed anesthesia with the patient or authorized representative who has indicated his/her understanding and acceptance.       Plan Discussed with: CRNA  Anesthesia Plan Comments:        Anesthesia Quick Evaluation

## 2018-11-20 ENCOUNTER — Ambulatory Visit: Payer: 59 | Admitting: Anesthesiology

## 2018-11-20 ENCOUNTER — Encounter
Admission: RE | Disposition: A | Discharge status: Home / Self Care | Payer: Self-pay | Source: Home / Self Care | Attending: Orthopedic Surgery

## 2018-11-20 ENCOUNTER — Other Ambulatory Visit: Payer: Self-pay

## 2018-11-20 ENCOUNTER — Ambulatory Visit
Admission: RE | Admit: 2018-11-20 | Discharge: 2018-11-20 | Disposition: A | Discharge status: Home / Self Care | Payer: 59 | Attending: Orthopedic Surgery | Admitting: Orthopedic Surgery

## 2018-11-20 DIAGNOSIS — X58XXXA Exposure to other specified factors, initial encounter: Secondary | ICD-10-CM | POA: Insufficient documentation

## 2018-11-20 DIAGNOSIS — Z8673 Personal history of transient ischemic attack (TIA), and cerebral infarction without residual deficits: Secondary | ICD-10-CM | POA: Insufficient documentation

## 2018-11-20 DIAGNOSIS — I10 Essential (primary) hypertension: Secondary | ICD-10-CM | POA: Insufficient documentation

## 2018-11-20 DIAGNOSIS — F319 Bipolar disorder, unspecified: Secondary | ICD-10-CM | POA: Insufficient documentation

## 2018-11-20 DIAGNOSIS — S83242A Other tear of medial meniscus, current injury, left knee, initial encounter: Secondary | ICD-10-CM | POA: Diagnosis present

## 2018-11-20 DIAGNOSIS — Z79899 Other long term (current) drug therapy: Secondary | ICD-10-CM | POA: Diagnosis not present

## 2018-11-20 DIAGNOSIS — K219 Gastro-esophageal reflux disease without esophagitis: Secondary | ICD-10-CM | POA: Diagnosis not present

## 2018-11-20 DIAGNOSIS — Z79891 Long term (current) use of opiate analgesic: Secondary | ICD-10-CM | POA: Insufficient documentation

## 2018-11-20 DIAGNOSIS — S86112A Strain of other muscle(s) and tendon(s) of posterior muscle group at lower leg level, left leg, initial encounter: Secondary | ICD-10-CM | POA: Diagnosis not present

## 2018-11-20 DIAGNOSIS — F17210 Nicotine dependence, cigarettes, uncomplicated: Secondary | ICD-10-CM | POA: Diagnosis not present

## 2018-11-20 DIAGNOSIS — E785 Hyperlipidemia, unspecified: Secondary | ICD-10-CM | POA: Diagnosis not present

## 2018-11-20 DIAGNOSIS — Z7982 Long term (current) use of aspirin: Secondary | ICD-10-CM | POA: Diagnosis not present

## 2018-11-20 DIAGNOSIS — Z7902 Long term (current) use of antithrombotics/antiplatelets: Secondary | ICD-10-CM | POA: Diagnosis not present

## 2018-11-20 HISTORY — PX: KNEE ARTHROSCOPY WITH MEDIAL MENISECTOMY: SHX5651

## 2018-11-20 SURGERY — ARTHROSCOPY, KNEE, WITH MEDIAL MENISCECTOMY
Anesthesia: General | Site: Knee | Laterality: Left

## 2018-11-20 MED ORDER — OXYCODONE HCL 5 MG/5ML PO SOLN: 5.0000 mg | Freq: Once | ORAL | Status: AC | PRN

## 2018-11-20 MED ORDER — HYDROCODONE-ACETAMINOPHEN 5-325 MG PO TABS: 1.0000 | ORAL_TABLET | ORAL | 0 refills | Status: AC | PRN

## 2018-11-20 MED ORDER — FENTANYL CITRATE (PF) 100 MCG/2ML IJ SOLN
INTRAMUSCULAR | Status: DC | PRN
  Administered 2018-11-20 (×2): 50 ug via INTRAVENOUS

## 2018-11-20 MED ORDER — BUPIVACAINE-EPINEPHRINE 0.5% -1:200000 IJ SOLN
INTRAMUSCULAR | Status: DC | PRN
  Administered 2018-11-20: 28 mL

## 2018-11-20 MED ORDER — DEXAMETHASONE SODIUM PHOSPHATE 4 MG/ML IJ SOLN
INTRAMUSCULAR | Status: DC | PRN
  Administered 2018-11-20: 4 mg via INTRAVENOUS

## 2018-11-20 MED ORDER — ASPIRIN EC 325 MG PO TBEC: 325.0000 mg | DELAYED_RELEASE_TABLET | Freq: Every day | ORAL | 0 refills | Status: AC

## 2018-11-20 MED ORDER — IBUPROFEN 800 MG PO TABS: 800.0000 mg | ORAL_TABLET | Freq: Three times a day (TID) | ORAL | 1 refills | Status: AC

## 2018-11-20 MED ORDER — ONDANSETRON 4 MG PO TBDP: 4.0000 mg | ORAL_TABLET | Freq: Three times a day (TID) | ORAL | 0 refills | Status: AC | PRN

## 2018-11-20 MED ORDER — DEXTROSE 5 % IV SOLN
2000.0000 mg | Freq: Once | INTRAVENOUS | Status: AC
  Administered 2018-11-20: 2000 mg via INTRAVENOUS

## 2018-11-20 MED ORDER — LACTATED RINGERS IV SOLN
INTRAVENOUS | Status: DC
  Administered 2018-11-20: 11:00:00 via INTRAVENOUS

## 2018-11-20 MED ORDER — ACETAMINOPHEN 500 MG PO TABS: 1000.0000 mg | ORAL_TABLET | Freq: Three times a day (TID) | ORAL | 2 refills | Status: AC

## 2018-11-20 MED ORDER — ONDANSETRON HCL 4 MG/2ML IJ SOLN
INTRAMUSCULAR | Status: DC | PRN
  Administered 2018-11-20: 4 mg via INTRAVENOUS

## 2018-11-20 MED ORDER — LIDOCAINE HCL 1 % IJ SOLN
INTRAMUSCULAR | Status: DC | PRN
  Administered 2018-11-20: 28 mL

## 2018-11-20 MED ORDER — LIDOCAINE HCL (CARDIAC) PF 100 MG/5ML IV SOSY
PREFILLED_SYRINGE | INTRAVENOUS | Status: DC | PRN
  Administered 2018-11-20: 30 mg via INTRATRACHEAL

## 2018-11-20 MED ORDER — PROPOFOL 10 MG/ML IV BOLUS
INTRAVENOUS | Status: DC | PRN
  Administered 2018-11-20: 150 mg via INTRAVENOUS
  Administered 2018-11-20: 50 mg via INTRAVENOUS

## 2018-11-20 MED ORDER — ACETAMINOPHEN 10 MG/ML IV SOLN: 1000.0000 mg | Freq: Once | INTRAVENOUS | Status: DC | PRN

## 2018-11-20 MED ORDER — FENTANYL CITRATE (PF) 100 MCG/2ML IJ SOLN: 25.0000 ug | INTRAMUSCULAR | Status: DC | PRN

## 2018-11-20 MED ORDER — MIDAZOLAM HCL 5 MG/5ML IJ SOLN
INTRAMUSCULAR | Status: DC | PRN
  Administered 2018-11-20: 2 mg via INTRAVENOUS

## 2018-11-20 MED ORDER — OXYCODONE HCL 5 MG PO TABS
5.0000 mg | ORAL_TABLET | Freq: Once | ORAL | Status: AC | PRN
  Administered 2018-11-20: 5 mg via ORAL

## 2018-11-20 MED ORDER — LACTATED RINGERS IR SOLN
Status: DC | PRN
  Administered 2018-11-20: 15000 mL

## 2018-11-20 MED ORDER — ALBUTEROL SULFATE (2.5 MG/3ML) 0.083% IN NEBU
2.5000 mg | INHALATION_SOLUTION | Freq: Once | RESPIRATORY_TRACT | Status: AC
  Administered 2018-11-20: 2.5 mg via RESPIRATORY_TRACT

## 2018-11-20 MED ORDER — ONDANSETRON HCL 4 MG/2ML IJ SOLN: 4.0000 mg | Freq: Once | INTRAMUSCULAR | Status: DC | PRN

## 2018-11-20 MED ORDER — LACTATED RINGERS IV SOLN: INTRAVENOUS | Status: DC

## 2018-11-20 SURGICAL SUPPLY — 42 items
ADAPTER IRRIG TUBE 2 SPIKE SOL (ADAPTER) ×4 IMPLANT
BLADE SURG SZ11 CARB STEEL (BLADE) ×2 IMPLANT
BNDG COHESIVE 4X5 TAN STRL (GAUZE/BANDAGES/DRESSINGS) ×2 IMPLANT
BNDG ESMARK 6X12 TAN STRL LF (GAUZE/BANDAGES/DRESSINGS) ×2 IMPLANT
BUR RADIUS 3.5 (BURR) IMPLANT
BUR RADIUS 4.0X18.5 (BURR) IMPLANT
CHLORAPREP W/TINT 26 (MISCELLANEOUS) ×2 IMPLANT
COOLER POLAR GLACIER W/PUMP (MISCELLANEOUS) ×2 IMPLANT
COVER LIGHT HANDLE UNIVERSAL (MISCELLANEOUS) ×4 IMPLANT
CUFF TOURN SGL QUICK 24 (TOURNIQUET CUFF) ×1
CUFF TOURN SGL QUICK 30 (TOURNIQUET CUFF)
CUFF TOURN SGL QUICK 34 (TOURNIQUET CUFF) ×1
CUFF TRNQT CYL 24X4X16.5-23 (TOURNIQUET CUFF) ×1 IMPLANT
CUFF TRNQT CYL 30X4X21-28X (TOURNIQUET CUFF) IMPLANT
CUFF TRNQT CYL 34X4.125X (TOURNIQUET CUFF) ×1 IMPLANT
DRAPE IMP U-DRAPE 54X76 (DRAPES) ×2 IMPLANT
GAUZE SPONGE 4X4 12PLY STRL (GAUZE/BANDAGES/DRESSINGS) ×2 IMPLANT
GAUZE XEROFORM 5X9 LF (GAUZE/BANDAGES/DRESSINGS) ×1 IMPLANT
GLOVE BIO SURGEON STRL SZ7.5 (GLOVE) ×2 IMPLANT
GLOVE BIOGEL PI IND STRL 8 (GLOVE) ×1 IMPLANT
GLOVE BIOGEL PI INDICATOR 8 (GLOVE) ×1
GOWN STRL REIN 2XL XLG LVL4 (GOWN DISPOSABLE) ×2 IMPLANT
GOWN STRL REUS W/ TWL LRG LVL3 (GOWN DISPOSABLE) ×1 IMPLANT
GOWN STRL REUS W/TWL LRG LVL3 (GOWN DISPOSABLE) ×1
IV LACTATED RINGER IRRG 3000ML (IV SOLUTION) ×4
IV LR IRRIG 3000ML ARTHROMATIC (IV SOLUTION) ×4 IMPLANT
KIT TURNOVER KIT A (KITS) ×2 IMPLANT
MAT ABSORB  FLUID 56X50 GRAY (MISCELLANEOUS) ×1
MAT ABSORB FLUID 56X50 GRAY (MISCELLANEOUS) ×1 IMPLANT
NEPTUNE MANIFOLD (MISCELLANEOUS) ×2 IMPLANT
PACK ARTHROSCOPY KNEE (MISCELLANEOUS) ×2 IMPLANT
PAD WRAPON POLAR KNEE (MISCELLANEOUS) ×1 IMPLANT
PADDING CAST BLEND 6X4 STRL (MISCELLANEOUS) ×1 IMPLANT
PADDING STRL CAST 6IN (MISCELLANEOUS) ×1
SET TUBE SUCT SHAVER OUTFL 24K (TUBING) ×2 IMPLANT
SET TUBE TIP INTRA-ARTICULAR (MISCELLANEOUS) ×2 IMPLANT
SUT ETHILON 3-0 FS-10 30 BLK (SUTURE) ×2
SUTURE EHLN 3-0 FS-10 30 BLK (SUTURE) ×1 IMPLANT
TOWEL OR 17X26 4PK STRL BLUE (TOWEL DISPOSABLE) ×4 IMPLANT
TUBING ARTHRO INFLOW-ONLY STRL (TUBING) ×2 IMPLANT
WAND WEREWOLF FLOW 90D (MISCELLANEOUS) IMPLANT
WRAPON POLAR PAD KNEE (MISCELLANEOUS) ×2

## 2018-11-20 NOTE — Anesthesia Postprocedure Evaluation (Signed)
Anesthesia Post Note  Patient: Timothy Pham  Procedure(s) Performed: KNEE ARTHROSCOPY WITH PARTIAL MEDIAL MENISECTOMY WITH POPLITEUS (Left Knee)  Patient location during evaluation: PACU Anesthesia Type: General Level of consciousness: awake and alert, oriented and patient cooperative Pain management: pain level controlled Vital Signs Assessment: post-procedure vital signs reviewed and stable Respiratory status: spontaneous breathing, nonlabored ventilation and respiratory function stable Cardiovascular status: blood pressure returned to baseline and stable Postop Assessment: adequate PO intake Anesthetic complications: no    Darrin Nipper

## 2018-11-20 NOTE — Anesthesia Procedure Notes (Signed)
Procedure Name: LMA Insertion Date/Time: 11/20/2018 11:37 AM Performed by: Cameron Ali, CRNA Pre-anesthesia Checklist: Patient identified, Emergency Drugs available, Suction available, Timeout performed and Patient being monitored Patient Re-evaluated:Patient Re-evaluated prior to induction Oxygen Delivery Method: Circle system utilized Preoxygenation: Pre-oxygenation with 100% oxygen Induction Type: IV induction LMA: LMA inserted LMA Size: 4.0 Number of attempts: 1 Placement Confirmation: positive ETCO2 and breath sounds checked- equal and bilateral Tube secured with: Tape Dental Injury: Teeth and Oropharynx as per pre-operative assessment

## 2018-11-20 NOTE — Discharge Instructions (Signed)
Arthroscopic Knee Surgery - Partial Meniscectomy   Post-Op Instructions   1. Bracing or crutches: Crutches will be provided at the time of discharge from the surgery center if you do not already have them.   2. Ice: You may be provided with a device [facility]) that allows you to ice the affected area effectively. Otherwise you can ice manually.    3. Driving:  Plan on not driving for at least two weeks. Please note that you are advised NOT to drive while taking narcotic pain medications as you may be impaired and unsafe to drive.   4. Activity: Ankle pumps several times an hour while awake to prevent blood clots. Weight bearing: as tolerated. Use crutches for as needed (usually ~1 week or less) until pain allows you to ambulate without a limp. Bending and straightening the knee is unlimited. Elevate knee above heart level as much as possible for one week. Avoid standing more than 5 minutes (consecutively) for the first week.  Avoid long distance travel for 2 weeks.  5. Medications:  - You have been provided a prescription for narcotic pain medicine. After surgery, take 1-2 narcotic tablets every 4 hours if needed for severe pain.  - You may take up to 3000mg /day of tylenol (acetaminophen). You can take 1000mg  3x/day. Please check your narcotic. If you have acetaminophen in your narcotic (each tablet will be 325mg ), be careful not to exceed a total of 3000mg /day of acetaminophen.  - A prescription for anti-nausea medication will be provided in case the narcotic medicine or anesthesia causes nausea - take 1 tablet every 6 hours only if nauseated.  - Take ibuprofen 800 mg every 8 hours WITH food to reduce post-operative knee swelling. DO NOT STOP IBUPROFEN POST-OP UNTIL INSTRUCTED TO DO SO at first post-op office visit (10-14 days after surgery). However, please discontinue if you have any abdominal discomfort after taking this.  - Take enteric coated aspirin 325 mg once daily for 2 weeks to prevent  blood clots. Can resume 81mg  daily aspirin after 2 weeks - Resume Plavix the day after surgery.   6. Bandages: The physical therapist should change the bandages at the first post-op appointment. If needed, the dressing supplies have been provided to you.   7. Physical Therapy: 1-2 times per week for 6 weeks. Therapy typically starts on post operative Day 3 or 4. You have been provided an order for physical therapy. The therapist will provide home exercises.   8. Work: May return to full work usually around 2 weeks after 1st post-operative visit. May do light duty/desk job in approximately 1-2 weeks when off of narcotics, pain is well-controlled, and swelling has decreased. Labor intensive jobs may require 4-6 weeks to return.      9. Post-Op Appointments: Your first post-op appointment will be with Dr. in approximately 2 weeks time.    If you find that they have not been scheduled please call the Orthopaedic Appointment front desk at [phone].    General Anesthesia, Adult, Care After This sheet gives you information about how to care for yourself after your procedure. Your health care provider may also give you more specific instructions. If you have problems or questions, contact your health care provider. What can I expect after the procedure? After the procedure, the following side effects are common:  Pain or discomfort at the IV site.  Nausea.  Vomiting.  Sore throat.  Trouble concentrating.  Feeling cold or chills.  Weak or tired.  Sleepiness  and fatigue.  Soreness and body aches. These side effects can affect parts of the body that were not involved in surgery. Follow these instructions at home:  For at least 24 hours after the procedure:  Have a responsible adult stay with you. It is important to have someone help care for you until you are awake and alert.  Rest as needed.  Do not: ? Participate in activities in which you could fall or become  injured. ? Drive. ? Use heavy machinery. ? Drink alcohol. ? Take sleeping pills or medicines that cause drowsiness. ? Make important decisions or sign legal documents. ? Take care of children on your own. Eating and drinking  Follow any instructions from your health care provider about eating or drinking restrictions.  When you feel hungry, start by eating small amounts of foods that are soft and easy to digest (bland), such as toast. Gradually return to your regular diet.  Drink enough fluid to keep your urine pale yellow.  If you vomit, rehydrate by drinking water, juice, or clear broth. General instructions  If you have sleep apnea, surgery and certain medicines can increase your risk for breathing problems. Follow instructions from your health care provider about wearing your sleep device: ? Anytime you are sleeping, including during daytime naps. ? While taking prescription pain medicines, sleeping medicines, or medicines that make you drowsy.  Return to your normal activities as told by your health care provider. Ask your health care provider what activities are safe for you.  Take over-the-counter and prescription medicines only as told by your health care provider.  If you smoke, do not smoke without supervision.  Keep all follow-up visits as told by your health care provider. This is important. Contact a health care provider if:  You have nausea or vomiting that does not get better with medicine.  You cannot eat or drink without vomiting.  You have pain that does not get better with medicine.  You are unable to pass urine.  You develop a skin rash.  You have a fever.  You have redness around your IV site that gets worse. Get help right away if:  You have difficulty breathing.  You have chest pain.  You have blood in your urine or stool, or you vomit blood. Summary  After the procedure, it is common to have a sore throat or nausea. It is also common to  feel tired.  Have a responsible adult stay with you for the first 24 hours after general anesthesia. It is important to have someone help care for you until you are awake and alert.  When you feel hungry, start by eating small amounts of foods that are soft and easy to digest (bland), such as toast. Gradually return to your regular diet.  Drink enough fluid to keep your urine pale yellow.  Return to your normal activities as told by your health care provider. Ask your health care provider what activities are safe for you. This information is not intended to replace advice given to you by your health care provider. Make sure you discuss any questions you have with your health care provider. Document Released: 05/13/2000 Document Revised: 02/07/2017 Document Reviewed: 09/20/2016 Elsevier Patient Education  2020 [employer].

## 2018-11-20 NOTE — Op Note (Signed)
Operative Note    SURGERY DATE: 11/20/2018   PRE-OP DIAGNOSIS:  1. Left medial meniscus tear 2. Left partial popliteus tear   POST-OP DIAGNOSIS:  1. Left medial meniscus tear 2. Left partial popliteus tear   PROCEDURES:  1.  Left knee arthroscopy, partial medial meniscectomy 2.  Left knee arthroscopic popliteus tendon debridement   SURGEON: Rosealee Albee, MD   ANESTHESIA: Gen   ESTIMATED BLOOD LOSS: minimal   TOTAL IV FLUIDS: per anesthesia   INDICATION(S):  Timothy Pham is a 53 y.o. male with signs and symptoms as well as MRI finding of medial meniscus tear. He had significant limited mobility due to knee pain.  He had a recent kyphoplasty and is recovering well from this.  After discussion of risks, benefits, and alternatives to surgery, the patient elected to proceed.   OPERATIVE FINDINGS:    Examination under anesthesia: A careful examination under anesthesia was performed.  Passive range of motion was: Hyperextension: 1.  Extension: 0.  Flexion: 130.  Lachman: normal. Pivot Shift: normal.  Posterior drawer: normal.  Varus stability in full extension: normal.  Varus stability in 30 degrees of flexion: normal.  Valgus stability in full extension: normal.  Valgus stability in 30 degrees of flexion: normal.   Intra-operative findings: A thorough arthroscopic examination of the knee was performed.  The findings are: 1. Suprapatellar pouch: Normal 2. Undersurface of median ridge: Grade 1 softening 3. Medial patellar facet: Grade 1 softening 4. Lateral patellar facet: Grade 1 softening 5. Trochlea: Grade 1 degenerative changes 6. Lateral gutter/popliteus tendon: Partial tear of the popliteus with flipped fragment in the posterior aspect of the knee posterior to the lateral meniscus 7. Hoffa's fat pad: Inflamed 8. Medial gutter/plica: Normal 9. ACL: Normal 10. PCL: Normal 11. Medial meniscus: Complex tear including at the posterior horn/body junction  and adjacent to the  meniscus root posterior horn.  The posterior horn/body tear was a small parrot-beak type tear of the meniscus undersurface with flipped fragment into the tibial recess and affected approximately 50% of the meniscus width.  The tear adjacent to the meniscus root was a small horizontal tear with the meniscus root itself intact.  12. Medial compartment cartilage: Normal 13. Lateral meniscus: Normal 14. Lateral compartment cartilage:  Normal   OPERATIVE REPORT:     I identified Timothy Pham in the pre-operative holding area. I marked the operative knee with my initials. I reviewed the risks and benefits of the proposed surgical intervention and the patient (and/or patient's guardian) wished to proceed. The patient was transferred to the operative suite and placed in the supine position with all bony prominences padded.  Anesthesia was administered. Appropriate IV antibiotics were administered prior to incision. The extremity was then prepped and draped in standard fashion. A time out was performed confirming the correct extremity, correct patient, and correct procedure.   Arthroscopy portals were marked. Local anesthetic was injected to the planned portal sites. The anterolateral portal was established with an 11 blade.    The arthroscope was placed in the anterolateral portal and then into the suprapatellar pouch.  A diagnostic knee scope was completed with the above findings. The medial meniscus tear was identified.   Next the medial portal was established under needle localization. The MCL was pie-crusted to improve visualization of the posterior horn. The meniscal tears were debrided using an arthroscopic biter and an oscillating shaver until the meniscus had stable borders.   After debridement of the parrot-beak type tear of the posterior  horn/body junction, approximately 50% of the meniscus width remained in this region.  A small portion of the inferior leaflet adjacent to the meniscus root was  debrided while leaving the superior leaflet of the posterior horn.  Next, attention was turned to the popliteus tear.  This was debrided with an oscillating shaver from both the anterolateral and anteromedial portals until there were no loose edges.  Probing and viewing from both portals confirm that the lateral meniscus itself was intact and stable.  Arthroscopic fluid was then removed from the joint.   The portals were closed with 3-0 Nylon suture. Sterile dressings included Xeroform, 4x4s, Sof-Rol, and Bias wrap. A Polarcare was placed.  The patient was then awakened and taken to the PACU hemodynamically stable without complication.     POSTOPERATIVE PLAN: The patient will be discharged home today once they meet PACU criteria. Aspirin 325 mg daily was prescribed for 2 weeks for DVT prophylaxis.  Physical therapy will start on POD#3-4. Weight-bearing as tolerated. Follow up in 2 weeks per protocol.

## 2018-11-20 NOTE — Transfer of Care (Signed)
Immediate Anesthesia Transfer of Care Note  Patient: Jymir Dunaj  Procedure(s) Performed: KNEE ARTHROSCOPY WITH PARTIAL MEDIAL MENISECTOMY WITH POPLITEUS (Left Knee)  Patient Location: PACU  Anesthesia Type: General  Level of Consciousness: awake, alert  and patient cooperative  Airway and Oxygen Therapy: Patient Spontanous Breathing and Patient connected to supplemental oxygen  Post-op Assessment: Post-op Vital signs reviewed, Patient's Cardiovascular Status Stable, Respiratory Function Stable, Patent Airway and No signs of Nausea or vomiting  Post-op Vital Signs: Reviewed and stable  Complications: No apparent anesthesia complications

## 2018-11-20 NOTE — H&P (Signed)
Paper H&P to be scanned into permanent record. H&P reviewed. No significant changes noted.  

## 2019-03-22 ENCOUNTER — Other Ambulatory Visit (INDEPENDENT_AMBULATORY_CARE_PROVIDER_SITE_OTHER): Payer: Self-pay | Admitting: Vascular Surgery

## 2019-03-22 DIAGNOSIS — I6522 Occlusion and stenosis of left carotid artery: Secondary | ICD-10-CM

## 2019-03-24 ENCOUNTER — Encounter (INDEPENDENT_AMBULATORY_CARE_PROVIDER_SITE_OTHER): Payer: Self-pay

## 2019-03-24 ENCOUNTER — Encounter (INDEPENDENT_AMBULATORY_CARE_PROVIDER_SITE_OTHER): Payer: 59

## 2019-03-24 ENCOUNTER — Ambulatory Visit (INDEPENDENT_AMBULATORY_CARE_PROVIDER_SITE_OTHER): Payer: Self-pay | Admitting: Nurse Practitioner

## 2021-02-28 IMAGING — XA DG C-ARM 1-60 MIN
5 series · 5 of 5 positions shown · non-contrast
Comparison: None.

CLINICAL DATA: Lumbar surgery.

EXAM:
LUMBAR SPINE - 2-3 VIEW; DG C-ARM 1-60 MIN
Radiation exposure index: 13.8 mGy.

[Series 1: cont. · 1 of 1 slices shown (1 of 5)]
[im 1/1]
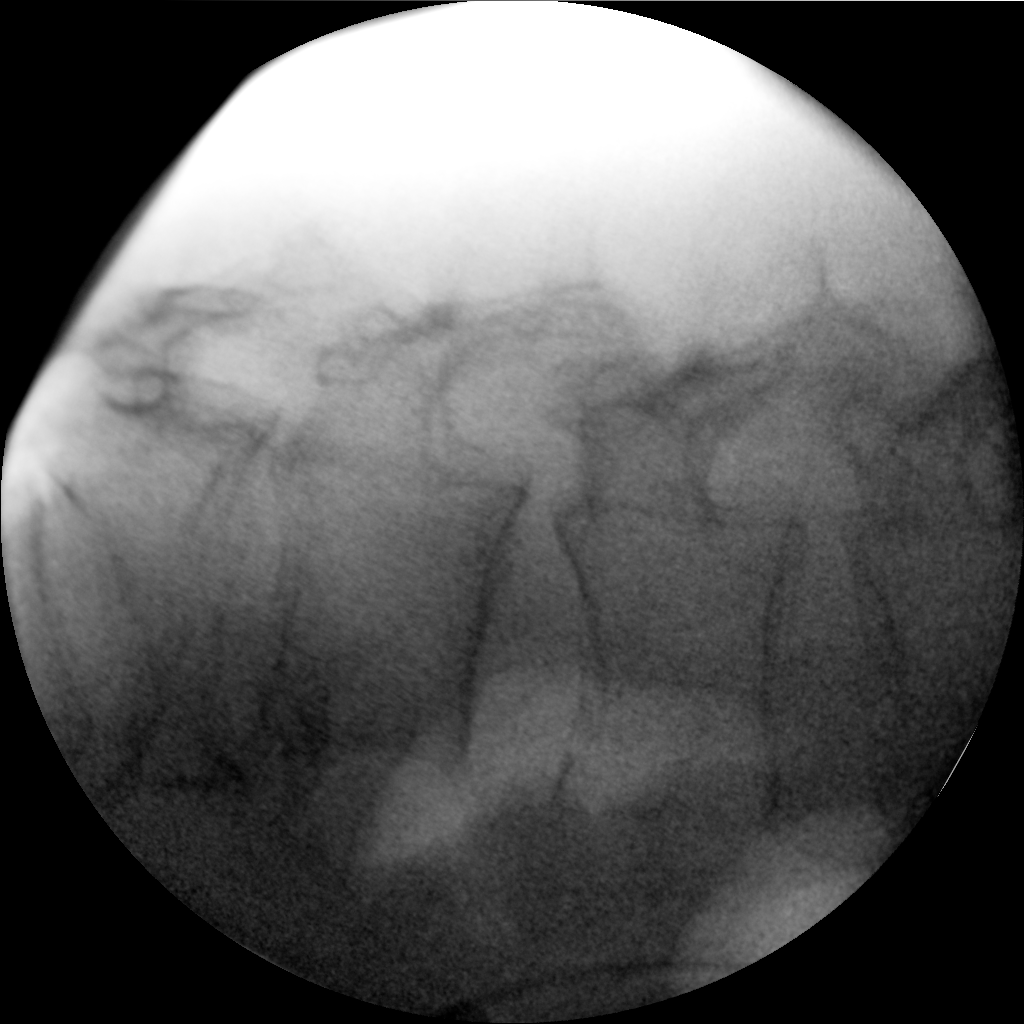

[Series 2: cont. · 1 of 1 slices shown (2 of 5)]
[im 1/1]
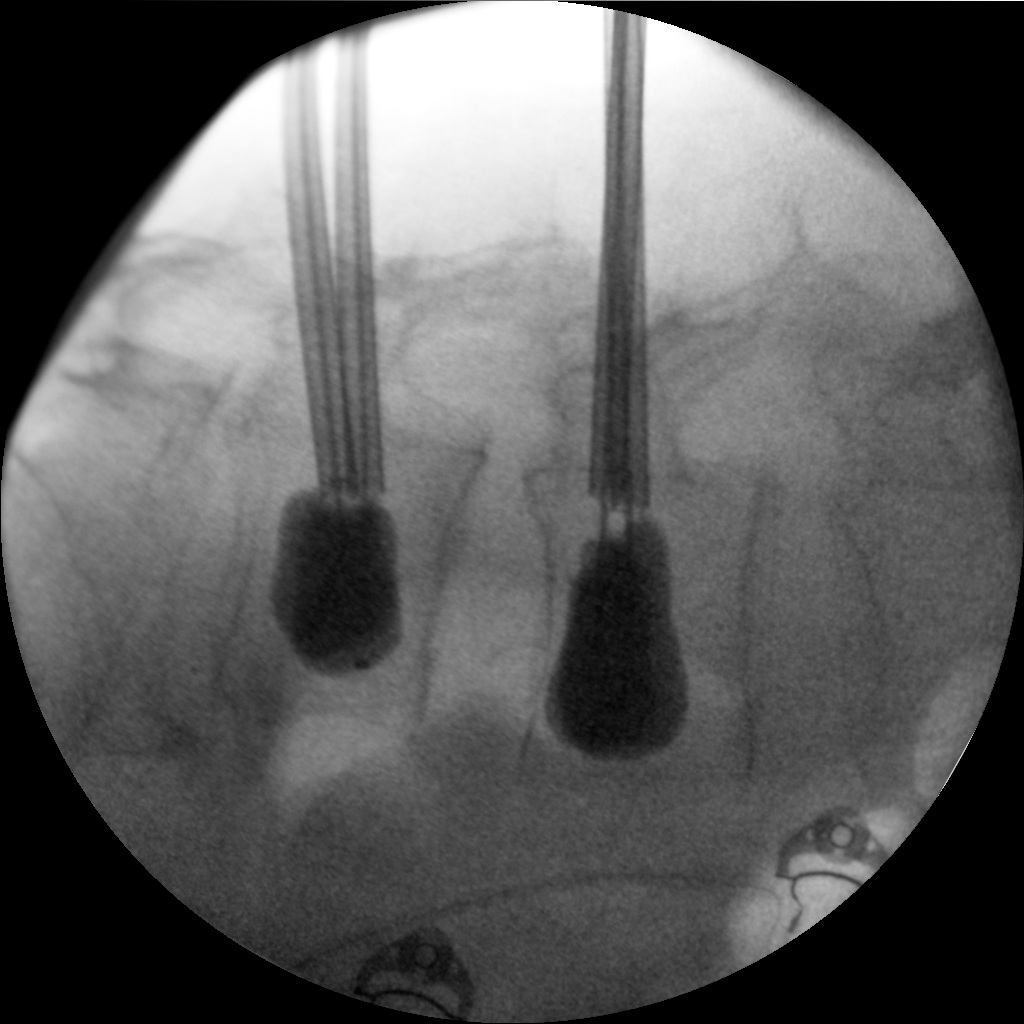

[Series 3: cont. · 1 of 1 slices shown (3 of 5)]
[im 1/1]
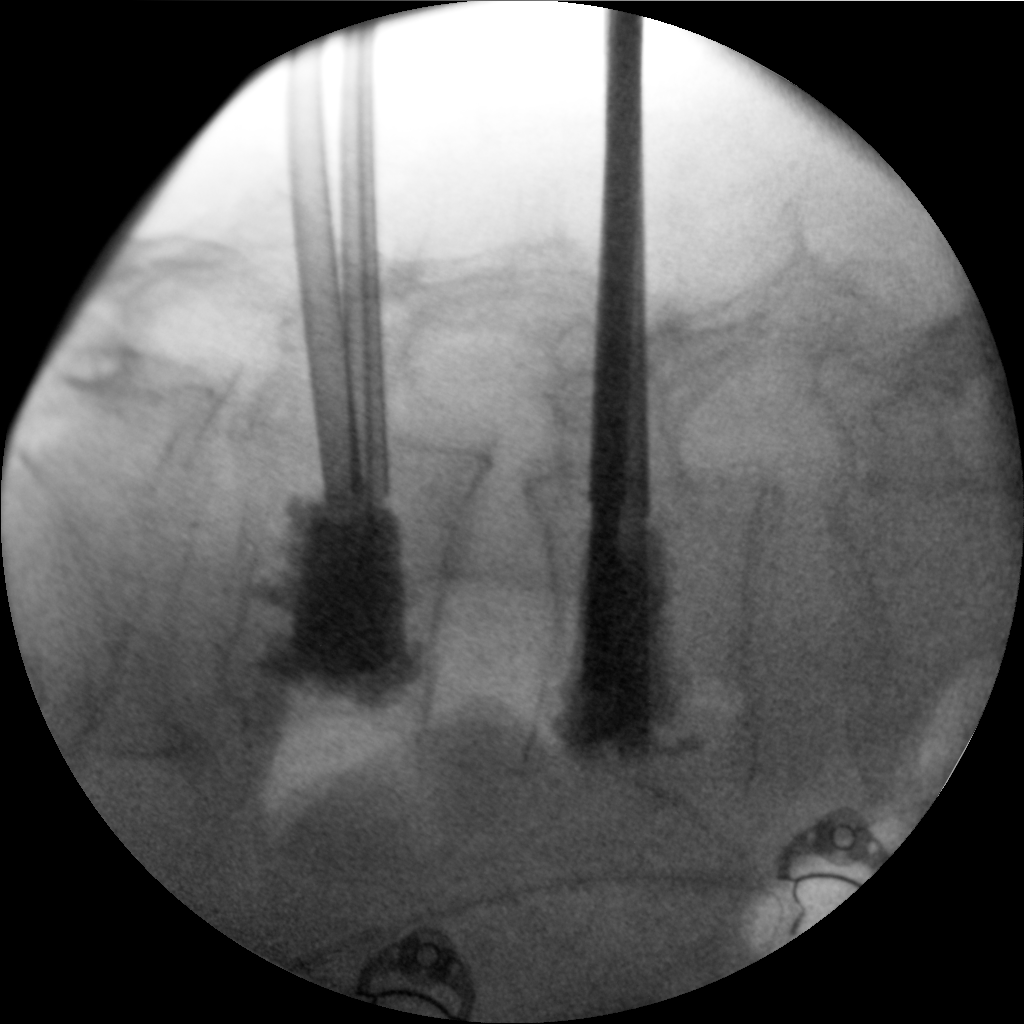

[Series 4: cont. · 1 of 1 slices shown (4 of 5)]
[im 1/1]
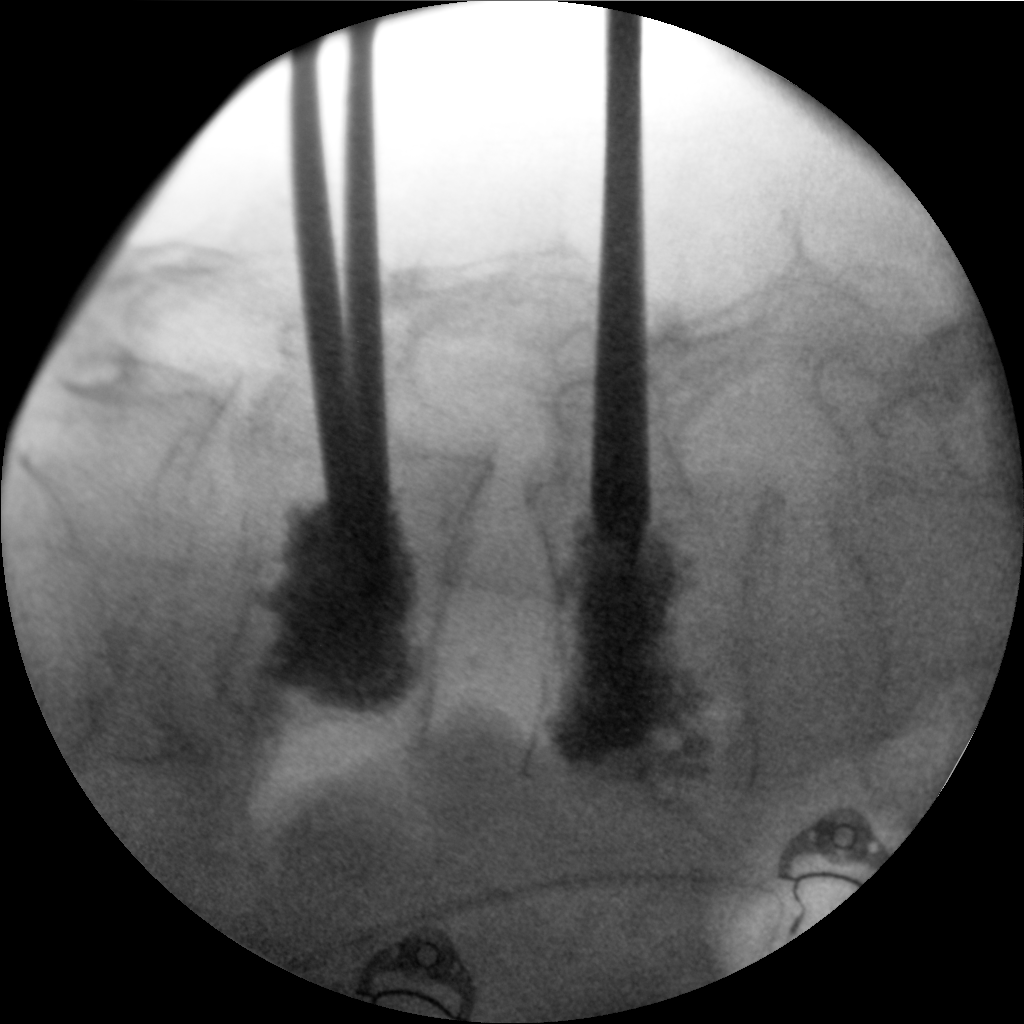

[Series 5: cont. · 1 of 1 slices shown (5 of 5)]
[im 1/1]
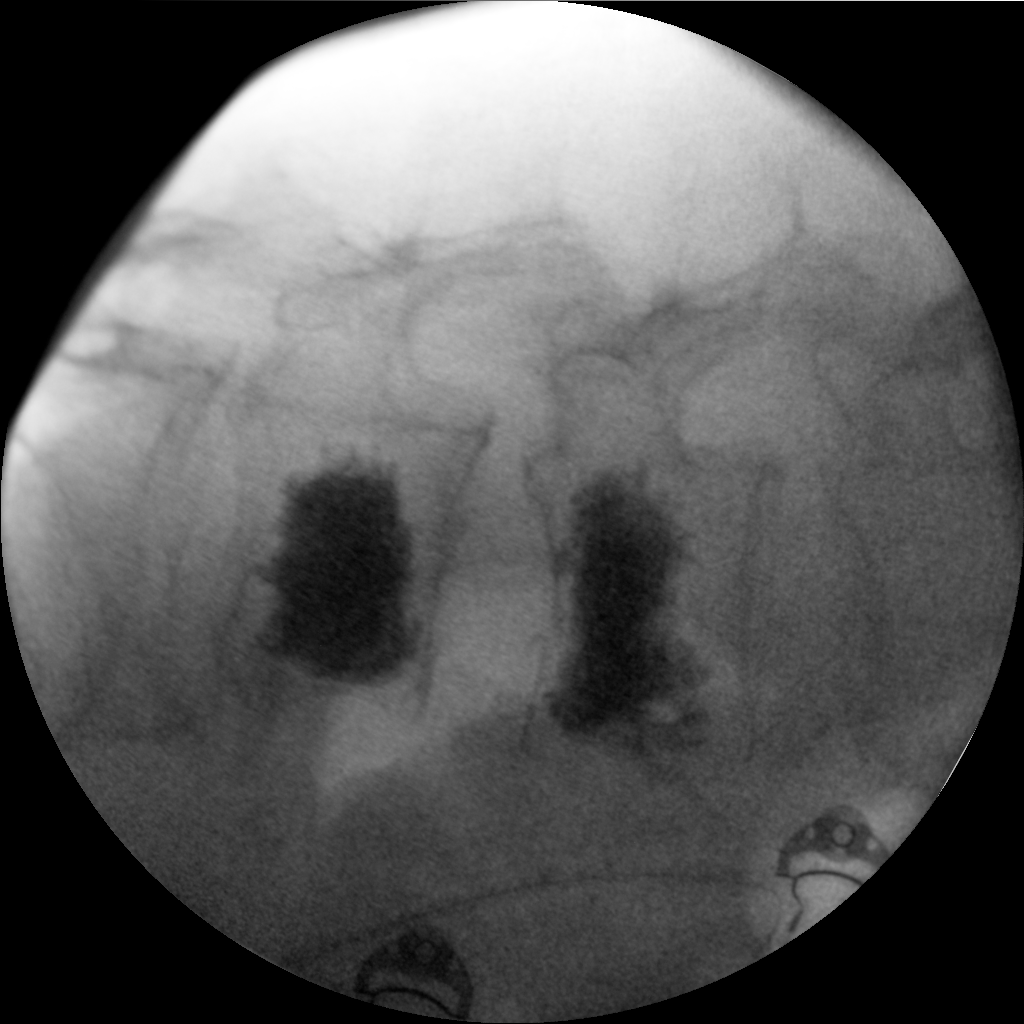

[5 of 5 positions shown; findings below may reference images not displayed]

FINDINGS: Five intraoperative fluoroscopic images were obtained of the lumbar
spine. These images demonstrate bilateral kyphoplasty being
performed at 2 adjacent lumbar vertebral bodies.
IMPRESSION: Fluoroscopic guidance provided during lumbar kyphoplasty.

## 2022-01-18 DEATH — deceased
